# Patient Record
Sex: Male | Born: 1945 | Race: White | Hispanic: No | Marital: Married | Smoking: Former smoker
Health system: Southern US, Community
[De-identification: ages and names within clinical notes are randomized; demographics above are authoritative.]

## PROBLEM LIST (undated history)

## (undated) DIAGNOSIS — F32A Depression, unspecified: Secondary | ICD-10-CM

## (undated) DIAGNOSIS — C801 Malignant (primary) neoplasm, unspecified: Secondary | ICD-10-CM

## (undated) DIAGNOSIS — B0229 Other postherpetic nervous system involvement: Secondary | ICD-10-CM

## (undated) DIAGNOSIS — I350 Nonrheumatic aortic (valve) stenosis: Secondary | ICD-10-CM

## (undated) DIAGNOSIS — F329 Major depressive disorder, single episode, unspecified: Secondary | ICD-10-CM

## (undated) DIAGNOSIS — R351 Nocturia: Secondary | ICD-10-CM

## (undated) DIAGNOSIS — R011 Cardiac murmur, unspecified: Secondary | ICD-10-CM

## (undated) DIAGNOSIS — G8929 Other chronic pain: Secondary | ICD-10-CM

## (undated) DIAGNOSIS — Z9989 Dependence on other enabling machines and devices: Secondary | ICD-10-CM

## (undated) DIAGNOSIS — I10 Essential (primary) hypertension: Secondary | ICD-10-CM

## (undated) DIAGNOSIS — M1990 Unspecified osteoarthritis, unspecified site: Secondary | ICD-10-CM

## (undated) DIAGNOSIS — R600 Localized edema: Secondary | ICD-10-CM

## (undated) DIAGNOSIS — Z87898 Personal history of other specified conditions: Secondary | ICD-10-CM

## (undated) DIAGNOSIS — E785 Hyperlipidemia, unspecified: Secondary | ICD-10-CM

## (undated) DIAGNOSIS — G4700 Insomnia, unspecified: Secondary | ICD-10-CM

## (undated) DIAGNOSIS — M25552 Pain in left hip: Secondary | ICD-10-CM

## (undated) DIAGNOSIS — N400 Enlarged prostate without lower urinary tract symptoms: Secondary | ICD-10-CM

## (undated) DIAGNOSIS — K219 Gastro-esophageal reflux disease without esophagitis: Secondary | ICD-10-CM

## (undated) DIAGNOSIS — K5900 Constipation, unspecified: Secondary | ICD-10-CM

## (undated) DIAGNOSIS — M4800 Spinal stenosis, site unspecified: Secondary | ICD-10-CM

## (undated) DIAGNOSIS — F419 Anxiety disorder, unspecified: Secondary | ICD-10-CM

## (undated) DIAGNOSIS — Z8709 Personal history of other diseases of the respiratory system: Secondary | ICD-10-CM

## (undated) DIAGNOSIS — G4733 Obstructive sleep apnea (adult) (pediatric): Secondary | ICD-10-CM

## (undated) DIAGNOSIS — J309 Allergic rhinitis, unspecified: Secondary | ICD-10-CM

## (undated) DIAGNOSIS — E876 Hypokalemia: Secondary | ICD-10-CM

## (undated) DIAGNOSIS — Z85828 Personal history of other malignant neoplasm of skin: Secondary | ICD-10-CM

## (undated) DIAGNOSIS — H269 Unspecified cataract: Secondary | ICD-10-CM

## (undated) DIAGNOSIS — M542 Cervicalgia: Secondary | ICD-10-CM

## (undated) DIAGNOSIS — B029 Zoster without complications: Secondary | ICD-10-CM

## (undated) DIAGNOSIS — M549 Dorsalgia, unspecified: Secondary | ICD-10-CM

## (undated) DIAGNOSIS — R0600 Dyspnea, unspecified: Secondary | ICD-10-CM

## (undated) DIAGNOSIS — I714 Abdominal aortic aneurysm, without rupture: Secondary | ICD-10-CM

## (undated) HISTORY — DX: Other postherpetic nervous system involvement: B02.29

## (undated) HISTORY — PX: COLONOSCOPY: SHX174

## (undated) HISTORY — DX: Allergic rhinitis, unspecified: J30.9

## (undated) HISTORY — PX: CATARACT EXTRACTION, BILATERAL: SHX1313

## (undated) HISTORY — DX: Insomnia, unspecified: G47.00

## (undated) HISTORY — PX: TRANSURETHRAL RESECTION OF PROSTATE: SHX73

## (undated) HISTORY — PX: TONSILLECTOMY: SUR1361

## (undated) HISTORY — PX: OTHER SURGICAL HISTORY: SHX169

## (undated) HISTORY — DX: Essential (primary) hypertension: I10

---

## 2011-01-26 HISTORY — PX: ANKLE ARTHROSCOPY: SHX545

## 2011-04-19 ENCOUNTER — Ambulatory Visit (HOSPITAL_BASED_OUTPATIENT_CLINIC_OR_DEPARTMENT_OTHER)
Admission: RE | Admit: 2011-04-19 | Discharge: 2011-04-19 | Disposition: A | Discharge status: Home / Self Care | Payer: Medicare Other | Source: Ambulatory Visit | Attending: Orthopedic Surgery | Admitting: Orthopedic Surgery

## 2011-04-19 DIAGNOSIS — Z01810 Encounter for preprocedural cardiovascular examination: Secondary | ICD-10-CM | POA: Insufficient documentation

## 2011-04-19 DIAGNOSIS — X58XXXA Exposure to other specified factors, initial encounter: Secondary | ICD-10-CM | POA: Insufficient documentation

## 2011-04-19 DIAGNOSIS — Z01812 Encounter for preprocedural laboratory examination: Secondary | ICD-10-CM | POA: Insufficient documentation

## 2011-04-19 DIAGNOSIS — IMO0002 Reserved for concepts with insufficient information to code with codable children: Secondary | ICD-10-CM | POA: Insufficient documentation

## 2011-04-19 DIAGNOSIS — M25569 Pain in unspecified knee: Secondary | ICD-10-CM | POA: Insufficient documentation

## 2011-04-19 DIAGNOSIS — R9431 Abnormal electrocardiogram [ECG] [EKG]: Secondary | ICD-10-CM | POA: Insufficient documentation

## 2011-04-19 HISTORY — PX: KNEE ARTHROSCOPY: SUR90

## 2011-04-19 LAB — POCT I-STAT 4, (NA,K, GLUC, HGB,HCT)
Glucose, Bld: 117 mg/dL — ABNORMAL HIGH (ref 70–99)
HCT: 44 % (ref 39.0–52.0)
Hemoglobin: 15 g/dL (ref 13.0–17.0)

## 2011-04-29 NOTE — Op Note (Signed)
  NAME:  JWAN, HORNBAKER NO.:  [id number]  MEDICAL RECORD NO.:  [id number]          PATIENT TYPE:  LOCATION:                                 FACILITY:  PHYSICIAN:  Ollen Gross, M.D.         DATE OF BIRTH:  DATE OF PROCEDURE:  04/19/2011 DATE OF DISCHARGE:                              OPERATIVE REPORT   PREOPERATIVE DIAGNOSIS:  Left knee medial meniscal tear.  POSTOPERATIVE DIAGNOSIS:  Left knee medial meniscal tear.  PROCEDURE:  Left knee arthroscopy with medial meniscal debridement.  SURGEON:  Ollen Gross, MD  ASSISTANT:  None.  ANESTHESIA:  General.  ESTIMATED BLOOD LOSS:  Minimal.  DRAINS:  None.  COMPLICATIONS:  None.  CONDITION:  Stable to Recovery.  BRIEF CLINICAL NOTE:  Mr. Martin is a 65 year old male with several- month history of significant left knee pain, mechanical symptoms.  Exam and history suggested a medial meniscal tear confirmed by MRI.  He presents for arthroscopy and debridement.  PROCEDURE IN DETAIL:  After successful administration of general anesthetic, a tourniquet was placed high on his left thigh and his left lower extremity was prepped and draped in usual sterile fashion. Standard superomedial and inferolateral incisions made, inflow cannula passed superomedial, camera passed inferolateral.  Arthroscopic visualization proceeds.  Undersurface of the patella and trochlea looked normal.  The median and lateral gutters were visualized and there were no loose bodies.  Flexion and valgus force was applied to the knee and medial compartment was entered.  Does have evidence of a tear in the undersurface of the body and posterior horn of the medial meniscus.  The chondral surfaces looked normal.  Spinal needle was used to localize the inferomedial portal.  Small incision was made and dilator placed. Meniscus was debrided back to a stable base with baskets and a 4.2 mm shaver and then sealed off with the ArthroCare device.   It is found to be stable.  The chondral surfaces remained normal looking.  The intercondylar notch was visualized.  The ACL was normal.  Lateral compartment were entered and it looks normal.  Joints again inspected. No other tears, loose bodies or defects were noted.  The arthroscopic equipments were then removed from inferior portals, which were closed with interrupted 4-0 nylon.  20 mL of 0.25% Marcaine with epi injected through the inflow cannula and that is removed and then that portal closed with nylon.  The incision was cleaned and dried and a bulky sterile dressing applied.  He was subsequently awakened and transported to Recovery in stable condition.     Ollen Gross, M.D.     FA/MEDQ  D:  04/19/2011  T:  04/19/2011  Job:  [id number]  Electronically Signed by Ollen Gross M.D. on 04/29/2011 04:05:33 PM

## 2014-05-05 ENCOUNTER — Other Ambulatory Visit: Payer: Self-pay | Admitting: Family Medicine

## 2014-05-05 DIAGNOSIS — Z87891 Personal history of nicotine dependence: Secondary | ICD-10-CM

## 2014-05-11 ENCOUNTER — Ambulatory Visit
Admission: RE | Admit: 2014-05-11 | Discharge: 2014-05-11 | Disposition: A | Discharge status: Home / Self Care | Payer: Medicare Other | Source: Ambulatory Visit | Attending: Family Medicine | Admitting: Family Medicine

## 2014-05-11 DIAGNOSIS — Z87891 Personal history of nicotine dependence: Secondary | ICD-10-CM

## 2014-08-05 ENCOUNTER — Encounter: Payer: Self-pay | Admitting: *Deleted

## 2014-11-16 ENCOUNTER — Ambulatory Visit
Admission: RE | Admit: 2014-11-16 | Discharge: 2014-11-16 | Disposition: A | Discharge status: Home / Self Care | Payer: Medicare Other | Source: Ambulatory Visit | Attending: Family Medicine | Admitting: Family Medicine

## 2014-11-16 ENCOUNTER — Other Ambulatory Visit: Payer: Self-pay | Admitting: Family Medicine

## 2014-11-16 DIAGNOSIS — R05 Cough: Secondary | ICD-10-CM

## 2014-11-16 DIAGNOSIS — R059 Cough, unspecified: Secondary | ICD-10-CM

## 2015-04-27 ENCOUNTER — Ambulatory Visit: Payer: Self-pay | Admitting: Orthopedic Surgery

## 2015-04-27 NOTE — Progress Notes (Signed)
Preoperative surgical orders have been place into the Epic hospital system for [employer] on 04/27/2015, 5:40 PM  by Mickel Crow for surgery on 05-18-2015.  Preop Knee Scope orders including IV Tylenol and IV Decadron as long as there are no contraindications to the above medications. Arlee Muslim, PA-C

## 2015-05-06 ENCOUNTER — Encounter (HOSPITAL_BASED_OUTPATIENT_CLINIC_OR_DEPARTMENT_OTHER): Payer: Self-pay | Admitting: *Deleted

## 2015-05-11 ENCOUNTER — Encounter (HOSPITAL_BASED_OUTPATIENT_CLINIC_OR_DEPARTMENT_OTHER): Payer: Self-pay | Admitting: *Deleted

## 2015-05-11 NOTE — Progress Notes (Signed)
Pt instructed npo pmn 6/20.  May have clear liquids until 1000, then absolutely nothing by mouth.  Pt to take xanaflex, pravastatin, norvasc, Kt, prilosec .  To [facility] 6/21 at 1230. Needs istat, ekg on arrival.

## 2015-05-18 ENCOUNTER — Ambulatory Visit (HOSPITAL_BASED_OUTPATIENT_CLINIC_OR_DEPARTMENT_OTHER)
Admission: RE | Admit: 2015-05-18 | Discharge: 2015-05-18 | Disposition: A | Discharge status: Home / Self Care | Payer: Medicare Other | Source: Ambulatory Visit | Attending: Orthopedic Surgery | Admitting: Orthopedic Surgery

## 2015-05-18 ENCOUNTER — Encounter (HOSPITAL_BASED_OUTPATIENT_CLINIC_OR_DEPARTMENT_OTHER): Payer: Self-pay | Admitting: *Deleted

## 2015-05-18 ENCOUNTER — Encounter (HOSPITAL_BASED_OUTPATIENT_CLINIC_OR_DEPARTMENT_OTHER)
Admission: RE | Disposition: A | Discharge status: Home / Self Care | Payer: Self-pay | Source: Ambulatory Visit | Attending: Orthopedic Surgery

## 2015-05-18 ENCOUNTER — Other Ambulatory Visit: Payer: Self-pay

## 2015-05-18 ENCOUNTER — Ambulatory Visit (HOSPITAL_BASED_OUTPATIENT_CLINIC_OR_DEPARTMENT_OTHER): Payer: Medicare Other | Admitting: Anesthesiology

## 2015-05-18 DIAGNOSIS — S83249A Other tear of medial meniscus, current injury, unspecified knee, initial encounter: Secondary | ICD-10-CM | POA: Diagnosis present

## 2015-05-18 DIAGNOSIS — I10 Essential (primary) hypertension: Secondary | ICD-10-CM | POA: Diagnosis not present

## 2015-05-18 DIAGNOSIS — Z87891 Personal history of nicotine dependence: Secondary | ICD-10-CM | POA: Insufficient documentation

## 2015-05-18 DIAGNOSIS — G4733 Obstructive sleep apnea (adult) (pediatric): Secondary | ICD-10-CM | POA: Diagnosis not present

## 2015-05-18 DIAGNOSIS — Z791 Long term (current) use of non-steroidal anti-inflammatories (NSAID): Secondary | ICD-10-CM | POA: Diagnosis not present

## 2015-05-18 DIAGNOSIS — Z9989 Dependence on other enabling machines and devices: Secondary | ICD-10-CM | POA: Diagnosis not present

## 2015-05-18 DIAGNOSIS — S83241A Other tear of medial meniscus, current injury, right knee, initial encounter: Secondary | ICD-10-CM | POA: Diagnosis not present

## 2015-05-18 DIAGNOSIS — J309 Allergic rhinitis, unspecified: Secondary | ICD-10-CM | POA: Insufficient documentation

## 2015-05-18 DIAGNOSIS — M25561 Pain in right knee: Secondary | ICD-10-CM | POA: Diagnosis present

## 2015-05-18 DIAGNOSIS — K219 Gastro-esophageal reflux disease without esophagitis: Secondary | ICD-10-CM | POA: Insufficient documentation

## 2015-05-18 DIAGNOSIS — G4700 Insomnia, unspecified: Secondary | ICD-10-CM | POA: Insufficient documentation

## 2015-05-18 DIAGNOSIS — Y929 Unspecified place or not applicable: Secondary | ICD-10-CM | POA: Diagnosis not present

## 2015-05-18 DIAGNOSIS — M545 Low back pain: Secondary | ICD-10-CM | POA: Diagnosis not present

## 2015-05-18 DIAGNOSIS — G8929 Other chronic pain: Secondary | ICD-10-CM | POA: Diagnosis not present

## 2015-05-18 DIAGNOSIS — N400 Enlarged prostate without lower urinary tract symptoms: Secondary | ICD-10-CM | POA: Insufficient documentation

## 2015-05-18 DIAGNOSIS — Y999 Unspecified external cause status: Secondary | ICD-10-CM | POA: Insufficient documentation

## 2015-05-18 DIAGNOSIS — X58XXXA Exposure to other specified factors, initial encounter: Secondary | ICD-10-CM | POA: Diagnosis not present

## 2015-05-18 DIAGNOSIS — Y939 Activity, unspecified: Secondary | ICD-10-CM | POA: Insufficient documentation

## 2015-05-18 DIAGNOSIS — Z7982 Long term (current) use of aspirin: Secondary | ICD-10-CM | POA: Diagnosis not present

## 2015-05-18 DIAGNOSIS — M13872 Other specified arthritis, left ankle and foot: Secondary | ICD-10-CM | POA: Insufficient documentation

## 2015-05-18 DIAGNOSIS — Z79899 Other long term (current) drug therapy: Secondary | ICD-10-CM | POA: Diagnosis not present

## 2015-05-18 HISTORY — PX: KNEE ARTHROSCOPY WITH MEDIAL MENISECTOMY: SHX5651

## 2015-05-18 HISTORY — DX: Personal history of other diseases of the respiratory system: Z87.09

## 2015-05-18 HISTORY — DX: Unspecified osteoarthritis, unspecified site: M19.90

## 2015-05-18 HISTORY — DX: Dependence on other enabling machines and devices: Z99.89

## 2015-05-18 HISTORY — DX: Gastro-esophageal reflux disease without esophagitis: K21.9

## 2015-05-18 HISTORY — DX: Benign prostatic hyperplasia without lower urinary tract symptoms: N40.0

## 2015-05-18 HISTORY — DX: Obstructive sleep apnea (adult) (pediatric): G47.33

## 2015-05-18 LAB — POCT I-STAT, CHEM 8
BUN: 13 mg/dL (ref 6–20)
CHLORIDE: 98 mmol/L — AB (ref 101–111)
Calcium, Ion: 1.24 mmol/L (ref 1.13–1.30)
Creatinine, Ser: 0.7 mg/dL (ref 0.61–1.24)
Glucose, Bld: 103 mg/dL — ABNORMAL HIGH (ref 65–99)
HCT: 43 % (ref 39.0–52.0)
Hemoglobin: 14.6 g/dL (ref 13.0–17.0)
Potassium: 3.3 mmol/L — ABNORMAL LOW (ref 3.5–5.1)
Sodium: 138 mmol/L (ref 135–145)
TCO2: 26 mmol/L (ref 0–100)

## 2015-05-18 SURGERY — ARTHROSCOPY, KNEE, WITH MEDIAL MENISCECTOMY
Anesthesia: General | Site: Knee | Laterality: Right

## 2015-05-18 MED ORDER — MEPERIDINE HCL 25 MG/ML IJ SOLN
6.2500 mg | INTRAMUSCULAR | Status: DC | PRN
  Filled 2015-05-18: qty 1

## 2015-05-18 MED ORDER — STERILE WATER FOR IRRIGATION IR SOLN
Status: DC | PRN
  Administered 2015-05-18: 500 mL

## 2015-05-18 MED ORDER — LIDOCAINE HCL (CARDIAC) 20 MG/ML IV SOLN
INTRAVENOUS | Status: DC | PRN
  Administered 2015-05-18: 50 mg via INTRAVENOUS

## 2015-05-18 MED ORDER — MIDAZOLAM HCL 2 MG/2ML IJ SOLN
INTRAMUSCULAR | Status: AC
  Filled 2015-05-18: qty 2

## 2015-05-18 MED ORDER — ONDANSETRON HCL 4 MG/2ML IJ SOLN
INTRAMUSCULAR | Status: DC | PRN
  Administered 2015-05-18: 4 mg via INTRAVENOUS

## 2015-05-18 MED ORDER — OXYCODONE HCL 5 MG PO TABS
ORAL_TABLET | ORAL | Status: AC
  Filled 2015-05-18: qty 1

## 2015-05-18 MED ORDER — CEFAZOLIN SODIUM-DEXTROSE 2-3 GM-% IV SOLR
2.0000 g | INTRAVENOUS | Status: AC
  Administered 2015-05-18: 2 g via INTRAVENOUS
  Filled 2015-05-18: qty 50

## 2015-05-18 MED ORDER — KETOROLAC TROMETHAMINE 30 MG/ML IJ SOLN
INTRAMUSCULAR | Status: DC | PRN
  Administered 2015-05-18: 30 mg via INTRAVENOUS

## 2015-05-18 MED ORDER — LACTATED RINGERS IV SOLN
INTRAVENOUS | Status: DC
  Administered 2015-05-18: 13:00:00 via INTRAVENOUS
  Filled 2015-05-18: qty 1000

## 2015-05-18 MED ORDER — DEXAMETHASONE SODIUM PHOSPHATE 4 MG/ML IJ SOLN
INTRAMUSCULAR | Status: DC | PRN
  Administered 2015-05-18: 10 mg via INTRAVENOUS

## 2015-05-18 MED ORDER — FENTANYL CITRATE (PF) 100 MCG/2ML IJ SOLN
INTRAMUSCULAR | Status: AC
  Filled 2015-05-18: qty 4

## 2015-05-18 MED ORDER — HYDROCODONE-ACETAMINOPHEN 5-325 MG PO TABS: 1.0000 | ORAL_TABLET | Freq: Four times a day (QID) | ORAL | Status: DC | PRN

## 2015-05-18 MED ORDER — PROPOFOL 10 MG/ML IV BOLUS
INTRAVENOUS | Status: DC | PRN
  Administered 2015-05-18: 150 mg via INTRAVENOUS

## 2015-05-18 MED ORDER — CEFAZOLIN SODIUM-DEXTROSE 2-3 GM-% IV SOLR
INTRAVENOUS | Status: AC
  Filled 2015-05-18: qty 50

## 2015-05-18 MED ORDER — CHLORHEXIDINE GLUCONATE 4 % EX LIQD
60.0000 mL | Freq: Once | CUTANEOUS | Status: DC
  Filled 2015-05-18: qty 60

## 2015-05-18 MED ORDER — FENTANYL CITRATE (PF) 100 MCG/2ML IJ SOLN
25.0000 ug | INTRAMUSCULAR | Status: DC | PRN
  Filled 2015-05-18: qty 1

## 2015-05-18 MED ORDER — BUPIVACAINE-EPINEPHRINE 0.25% -1:200000 IJ SOLN
INTRAMUSCULAR | Status: DC | PRN
  Administered 2015-05-18: 20 mL

## 2015-05-18 MED ORDER — ACETAMINOPHEN 10 MG/ML IV SOLN
1000.0000 mg | Freq: Once | INTRAVENOUS | Status: AC
  Administered 2015-05-18: 1000 mg via INTRAVENOUS
  Filled 2015-05-18: qty 100

## 2015-05-18 MED ORDER — MIDAZOLAM HCL 5 MG/5ML IJ SOLN
INTRAMUSCULAR | Status: DC | PRN
  Administered 2015-05-18: 0.5 mg via INTRAVENOUS

## 2015-05-18 MED ORDER — FENTANYL CITRATE (PF) 100 MCG/2ML IJ SOLN
INTRAMUSCULAR | Status: DC | PRN
  Administered 2015-05-18 (×4): 25 ug via INTRAVENOUS
  Administered 2015-05-18: 50 ug via INTRAVENOUS

## 2015-05-18 MED ORDER — DEXAMETHASONE SODIUM PHOSPHATE 10 MG/ML IJ SOLN
10.0000 mg | Freq: Once | INTRAMUSCULAR | Status: DC
  Filled 2015-05-18: qty 1

## 2015-05-18 MED ORDER — SODIUM CHLORIDE 0.9 % IR SOLN
Status: DC | PRN
  Administered 2015-05-18: 6000 mL

## 2015-05-18 MED ORDER — OXYCODONE HCL 5 MG PO TABS
5.0000 mg | ORAL_TABLET | Freq: Once | ORAL | Status: AC
Start: 2015-05-18 — End: 2015-05-18
  Administered 2015-05-18: 5 mg via ORAL
  Filled 2015-05-18: qty 1

## 2015-05-18 MED ORDER — SODIUM CHLORIDE 0.9 % IV SOLN
INTRAVENOUS | Status: DC
  Filled 2015-05-18: qty 1000

## 2015-05-18 SURGICAL SUPPLY — 32 items
BANDAGE ELASTIC 6 VELCRO ST LF (GAUZE/BANDAGES/DRESSINGS) ×3 IMPLANT
BLADE 4.2CUDA (BLADE) ×3 IMPLANT
CLOTH BEACON ORANGE TIMEOUT ST (SAFETY) ×3 IMPLANT
CUFF TOURN SGL QUICK 34 (TOURNIQUET CUFF) ×2
CUFF TRNQT CYL 34X4X40X1 (TOURNIQUET CUFF) ×1 IMPLANT
DRAPE ARTHROSCOPY W/POUCH 114 (DRAPES) ×3 IMPLANT
DRAPE U-SHAPE 47X51 STRL (DRAPES) ×3 IMPLANT
DRSG EMULSION OIL 3X3 NADH (GAUZE/BANDAGES/DRESSINGS) ×3 IMPLANT
DRSG PAD ABDOMINAL 8X10 ST (GAUZE/BANDAGES/DRESSINGS) ×3 IMPLANT
DURAPREP 26ML APPLICATOR (WOUND CARE) ×3 IMPLANT
GLOVE BIO SURGEON STRL SZ8 (GLOVE) ×3 IMPLANT
GLOVE BIOGEL PI IND STRL 7.5 (GLOVE) ×1 IMPLANT
GLOVE BIOGEL PI INDICATOR 7.5 (GLOVE) ×2
GLOVE INDICATOR 8.0 STRL GRN (GLOVE) ×3 IMPLANT
GLOVE SURG SS PI 7.5 STRL IVOR (GLOVE) ×3 IMPLANT
GOWN STRL REUS W/ TWL LRG LVL3 (GOWN DISPOSABLE) ×2 IMPLANT
GOWN STRL REUS W/TWL LRG LVL3 (GOWN DISPOSABLE) ×4
IV NS IRRIG 3000ML ARTHROMATIC (IV SOLUTION) ×3 IMPLANT
KNEE WRAP E Z 3 GEL PACK (MISCELLANEOUS) ×3 IMPLANT
MANIFOLD NEPTUNE II (INSTRUMENTS) ×3 IMPLANT
PACK ARTHROSCOPY DSU (CUSTOM PROCEDURE TRAY) ×3 IMPLANT
PACK BASIN DAY SURGERY FS (CUSTOM PROCEDURE TRAY) ×3 IMPLANT
PADDING CAST ABS 4INX4YD NS (CAST SUPPLIES) ×2
PADDING CAST ABS COTTON 4X4 ST (CAST SUPPLIES) ×1 IMPLANT
PADDING CAST COTTON 6X4 STRL (CAST SUPPLIES) ×3 IMPLANT
SET ARTHROSCOPY TUBING (MISCELLANEOUS) ×2
SET ARTHROSCOPY TUBING LN (MISCELLANEOUS) ×1 IMPLANT
SPONGE GAUZE 4X4 12PLY (GAUZE/BANDAGES/DRESSINGS) ×3 IMPLANT
SPONGE GAUZE 4X4 12PLY STER LF (GAUZE/BANDAGES/DRESSINGS) ×3 IMPLANT
SUT ETHILON 4 0 PS 2 18 (SUTURE) ×3 IMPLANT
TOWEL OR 17X24 6PK STRL BLUE (TOWEL DISPOSABLE) ×3 IMPLANT
WATER STERILE IRR 500ML POUR (IV SOLUTION) ×3 IMPLANT

## 2015-05-18 NOTE — Op Note (Signed)
Preoperative diagnosis-  Right knee medial meniscal tear  Postoperative diagnosis Right- knee medial meniscal tear   Procedure- Right knee arthroscopy with medial  meniscal debridement    Surgeon- Dione Plover. Sawyer Mentzer, MD  Anesthesia-General  EBL-  Minimal  Complications- None  Condition- PACU - hemodynamically stable.  Brief clinical note- -Mark Zamora is a 69 y.o.  male with a several month history of right knee pain and mechanical symptoms. Exam and history suggested medial meniscal tear confirmed by MRI. The patient presents now for arthroscopy and debridement   Procedure in detail -       After successful administration of General anesthetic, a tourmiquet is placed high on the Right  thigh and the Right lower extremity is prepped and draped in the usual sterile fashion. Time out is performed by the surgical team. Standard superomedial and inferolateral portal sites are marked and incisions made with an 11 blade. The inflow cannula is passed through the superomedial portal and camera through the inferolateral portal and inflow is initiated. Arthroscopic visualization proceeds.      The undersurface of the patella and trochlea are visualized and they are normal. The medial and lateral gutters are visualized and there are  no loose bodies. Flexion and valgus force is applied to the knee and the medial compartment is entered. A spinal needle is passed into the joint through the site marked for the inferomedial portal. A small incision is made and the dilator passed into the joint. The findings for the medial compartment are unstable tear of body and posterior horn of medial meniscus . The tear is debrided to a stable base with baskets and a shaver and sealed off with the Arthrocare. It is probed and found to be stable.There are no chondral defects.    The intercondylar notch is visualized and the ACL appears normal.The lateral compartment is inspected and is normal.     The joint is  again inspected and there are no other tears, defects or loose bodies identified. The arthroscopic equipment is then removed from the inferior portals which are closed with interrupted 4-0 nylon. 20 ml of .25% Marcaine with epinephrine are injected through the inflow cannula and the cannula is then removed and the portal closed with nylon. The incisions are cleaned and dried and a bulky sterile dressing is applied. The patient is then awakened and transported to recovery in stable condition.   05/18/2015, 3:13 PM

## 2015-05-18 NOTE — H&P (Signed)
CC- Mark Zamora is a 69 y.o. male who presents with right knee pain.  HPI- . Knee Pain: Patient presents with knee pain involving the  right knee. Onset of the symptoms was several months ago. Inciting event: none known. Current symptoms include giving out, locking, pain located medially and stiffness. Pain is aggravated by lateral movements, rising after sitting, standing and walking.  Patient has had no prior knee problems. Evaluation to date: MRI: abnormal medial meniscal tear. Treatment to date: rest.  Past Medical History  Diagnosis Date  . Chronic low back pain   . Allergic rhinitis   . Postherpetic neuralgia   . Insomnia   . History of chronic sinusitis   . Essential hypertension   . Right knee meniscal tear   . BPH (benign prostatic hypertrophy)   . History of concussion     as child-- no residual  . GERD (gastroesophageal reflux disease)   . OSA on CPAP     uses cpap  . Arthritis     l ankle    Past Surgical History  Procedure Laterality Date  . Knee arthroscopy Left 04-19-2011  . Ankle arthroscopy Left 03/ 2012  . Prostate surgery  2007 approx  . Pilonidal fistulectomy      Prior to Admission medications   Medication Sig Start Date End Date Taking? Authorizing Provider  acetaminophen (TYLENOL) 325 MG tablet Take 650 mg by mouth every 6 (six) hours as needed.   Yes Historical Provider, MD  amLODipine (NORVASC) 10 MG tablet Take 10 mg by mouth daily.   Yes Historical Provider, MD  aspirin 325 MG tablet Take 325 mg by mouth daily.   Yes Historical Provider, MD  cholecalciferol (VITAMIN D) 1000 UNITS tablet Take 2,000 Units by mouth daily.   Yes Historical Provider, MD  Diphenhydramine-PE-APAP (DELSYM COUGH/COLD NIGHT TIME PO) Take 2.5 mLs by mouth at bedtime as needed.   Yes Historical Provider, MD  doxazosin (CARDURA) 8 MG tablet Take 8 mg by mouth at bedtime.   Yes Historical Provider, MD  fluticasone (FLONASE) 50 MCG/ACT nasal spray Place 2 sprays into both  nostrils at bedtime as needed for allergies or rhinitis.   Yes Historical Provider, MD  furosemide (LASIX) 20 MG tablet Take 20 mg by mouth.   Yes Historical Provider, MD  hydrochlorothiazide (HYDRODIURIL) 25 MG tablet Take 25 mg by mouth daily.   Yes Historical Provider, MD  lisinopril (PRINIVIL,ZESTRIL) 40 MG tablet Take 40 mg by mouth daily.   Yes Historical Provider, MD  meloxicam (MOBIC) 15 MG tablet Take 15 mg by mouth daily with breakfast.   Yes Historical Provider, MD  Multiple Vitamins-Minerals (MULTIVITAMIN WITH MINERALS) tablet Take 1 tablet by mouth daily.   Yes Historical Provider, MD  omeprazole (PRILOSEC) 20 MG capsule Take 20 mg by mouth daily.   Yes Historical Provider, MD  potassium chloride SA (K-DUR,KLOR-CON) 20 MEQ tablet Take 20 mEq by mouth 2 (two) times daily. Take 2 tablets twice daily   Yes Historical Provider, MD  pravastatin (PRAVACHOL) 80 MG tablet Take 80 mg by mouth daily.   Yes Historical Provider, MD  tiZANidine (ZANAFLEX) 4 MG capsule Take 4 mg by mouth every 8 (eight) hours as needed for muscle spasms.   Yes Historical Provider, MD  vitamin E 100 UNIT capsule Take by mouth daily.   Yes Historical Provider, MD  zolpidem (AMBIEN) 10 MG tablet Take 10 mg by mouth at bedtime as needed for sleep.   Yes Historical Provider, MD  KNEE EXAM antalgic gait, soft tissue tenderness over medial joint line, no effusion, negative drawer sign, collateral ligaments intact  Physical Examination: General appearance - alert, well appearing, and in no distress Mental status - alert, oriented to person, place, and time Chest - clear to auscultation, no wheezes, rales or rhonchi, symmetric air entry Heart - normal rate, regular rhythm, normal S1, S2, no murmurs, rubs, clicks or gallops Abdomen - soft, nontender, nondistended, no masses or organomegaly Neurological - alert, oriented, normal speech, no focal findings or movement disorder noted    Asessment/Plan--- Right knee medial  meniscal tear- - Plan right knee arthroscopy with meniscal debridement. Procedure risks and potential comps discussed with patient who elects to proceed. Goals are decreased pain and increased function with a high likelihood of achieving both

## 2015-05-18 NOTE — Anesthesia Procedure Notes (Signed)
Procedure Name: LMA Insertion Date/Time: 05/18/2015 2:45 PM Performed by: Mechele Claude Pre-anesthesia Checklist: Patient identified, Emergency Drugs available, Suction available and Patient being monitored Patient Re-evaluated:Patient Re-evaluated prior to inductionOxygen Delivery Method: Circle System Utilized Preoxygenation: Pre-oxygenation with 100% oxygen Intubation Type: IV induction Ventilation: Mask ventilation without difficulty LMA: LMA inserted LMA Size: 4.0 Number of attempts: 1 Airway Equipment and Method: bite block Placement Confirmation: positive ETCO2 Tube secured with: Tape Dental Injury: Teeth and Oropharynx as per pre-operative assessment

## 2015-05-18 NOTE — Anesthesia Postprocedure Evaluation (Signed)
  Anesthesia Post-op Note  Patient: Mark Zamora  Procedure(s) Performed: Procedure(s) (LRB): RIGHT KNEE ARTHROSCOPY WITH  MEDIAL MENISCAL DEBRIDEMENT (Right)  Patient Location: PACU  Anesthesia Type: General  Level of Consciousness: awake and alert   Airway and Oxygen Therapy: Patient Spontanous Breathing  Post-op Pain: mild  Post-op Assessment: Post-op Vital signs reviewed, Patient's Cardiovascular Status Stable, Respiratory Function Stable, Patent Airway and No signs of Nausea or vomiting  Last Vitals:  Filed Vitals:   05/18/15 1552  BP: 168/82  Pulse: 63  Temp:   Resp: 18    Post-op Vital Signs: stable   Complications: No apparent anesthesia complications

## 2015-05-18 NOTE — Discharge Instructions (Signed)
° °Dr. Frank Aluisio °[facility] Specialist °[facility] °[address] °, [place] [address] °[phone] ° ° °Arthroscopic Procedure, Knee °An arthroscopic procedure can find what is wrong with your knee. °PROCEDURE °Arthroscopy is a surgical technique that allows your orthopedic surgeon to diagnose and treat your knee injury with accuracy. They will look into your knee through a small instrument. This is almost like a small (pencil sized) telescope. Because arthroscopy affects your knee less than open knee surgery, you can anticipate a more rapid recovery. Taking an active role by following your caregiver's instructions will help with rapid and complete recovery. Use crutches, rest, elevation, ice, and knee exercises as instructed. The length of recovery depends on various factors including type of injury, age, physical condition, medical conditions, and your rehabilitation. °Your knee is the joint between the large bones (femur and tibia) in your leg. Cartilage covers these bone ends which are smooth and slippery and allow your knee to bend and move smoothly. Two menisci, thick, semi-lunar shaped pads of cartilage which form a rim inside the joint, help absorb shock and stabilize your knee. Ligaments bind the bones together and support your knee joint. Muscles move the joint, help support your knee, and take stress off the joint itself. Because of this all programs and physical therapy to rehabilitate an injured or repaired knee require rebuilding and strengthening your muscles. °AFTER THE PROCEDURE °· After the procedure, you will be moved to a recovery area until most of the effects of the medication have worn off. Your caregiver will discuss the test results with you.  °· Only take over-the-counter or prescription medicines for pain, discomfort, or fever as directed by your caregiver.  °SEEK MEDICAL CARE IF:  °· You have increased bleeding from your wounds.  °· You see  redness, swelling, or have increasing pain in your wounds.  °· You have pus coming from your wound.  °· You have an oral temperature above 102° F (38.9° C).  °· You notice a bad smell coming from the wound or dressing.  °· You have severe pain with any motion of your knee.  °SEEK IMMEDIATE MEDICAL CARE IF:  °· You develop a rash.  °· You have difficulty breathing.  °· You have any allergic problems.  °FURTHER INSTRUCTIONS:  °· ICE to the affected knee every three hours for 30 minutes at a time and then as needed for pain and swelling.  Continue to use ice on the knee for pain and swelling from surgery. You may notice swelling that will progress down to the foot and ankle.  This is normal after surgery.  Elevate the leg when you are not up walking on it.   ° °DIET °You may resume your previous home diet once your are discharged from the hospital. ° °DRESSING / WOUND CARE / SHOWERING °You may start showering two days after being discharged home but do not submerge the incisions under water.  °Change dressing 48 hours after the procedure and then cover the small incisions with band aids until your follow up visit. °Change the surgical dressings daily and reapply a dry dressing each time.  ° °ACTIVITY °Walk with your walker as instructed. °Use walker as long as suggested by your caregivers. °Avoid periods of inactivity such as sitting longer than an hour when not asleep. This helps prevent blood clots.  °You may resume a sexual relationship in one month or when given the OK by your doctor.  °You may return to   work once you are cleared by your doctor.  Do not drive a car for 6 weeks or until released by you surgeon.  Do not drive while taking narcotics.  WEIGHT BEARING Weight Bearing as Tolerated with crutches or walker until you are comfortable and well balanced then walk without assistance  POSTOPERATIVE CONSTIPATION PROTOCOL Constipation - defined medically as fewer than three stools per week and severe  constipation as less than one stool per week.  One of the most common issues patients have following surgery is constipation.  Even if you have a regular bowel pattern at home, your normal regimen is likely to be disrupted due to multiple reasons following surgery.  Combination of anesthesia, postoperative narcotics, change in appetite and fluid intake all can affect your bowels.  In order to avoid complications following surgery, here are some recommendations in order to help you during your recovery period.  Colace (docusate) - Pick up an over-the-counter form of Colace or another stool softener and take twice a day as long as you are requiring postoperative pain medications.  Take with a full glass of water daily.  If you experience loose stools or diarrhea, hold the colace until you stool forms back up.  If your symptoms do not get better within 1 week or if they get worse, check with your doctor.  Dulcolax (bisacodyl) - Pick up over-the-counter and take as directed by the product packaging as needed to assist with the movement of your bowels.  Take with a full glass of water.  Use this product as needed if not relieved by Colace only.   MiraLax (polyethylene glycol) - Pick up over-the-counter to have on hand.  MiraLax is a solution that will increase the amount of water in your bowels to assist with bowel movements.  Take as directed and can mix with a glass of water, juice, soda, coffee, or tea.  Take if you go more than two days without a movement. Do not use MiraLax more than once per day. Call your doctor if you are still constipated or irregular after using this medication for 7 days in a row.  If you continue to have problems with postoperative constipation, please contact the office for further assistance and recommendations.  If you experience "the worst abdominal pain ever" or develop nausea or vomiting, please contact the office immediatly for further recommendations for  treatment.  ITCHING  If you experience itching with your medications, try taking only a single pain pill, or even half a pain pill at a time.  You can also use Benadryl over the counter for itching or also to help with sleep.   TED HOSE STOCKINGS Wear the elastic stockings on both legs for three weeks following surgery during the day but you may remove then at night for sleeping.  MEDICATIONS See your medication summary on the After Visit Summary that the nursing staff will review with you prior to discharge.  You may have some home medications which will be placed on hold until you complete the course of blood thinner medication.  It is important for you to complete the blood thinner medication as prescribed by your surgeon.  Continue your approved medications as instructed at time of discharge. Do not drive while taking narcotics.   PRECAUTIONS If you experience chest pain or shortness of breath - call 911 immediately for transfer to the hospital emergency department.  If you develop a fever greater that 101 F, purulent drainage from wound, increased redness or drainage  from wound, foul odor from the wound/dressing, or calf pain - CONTACT YOUR SURGEON.                                                   FOLLOW-UP APPOINTMENTS Make sure you keep all of your appointments after your operation with your surgeon and caregivers. You should call the office at [phone]  and make an appointment for approximately one week after the date of your surgery or on the date instructed by your surgeon outlined in the "After Visit Summary".  RANGE OF MOTION AND STRENGTHENING EXERCISES  Rehabilitation of the knee is important following a knee injury or an operation. After just a few days of immobilization, the muscles of the thigh which control the knee become weakened and shrink (atrophy). Knee exercises are designed to build up the tone and strength of the thigh muscles and to improve knee motion. Often  times heat used for twenty to thirty minutes before working out will loosen up your tissues and help with improving the range of motion but do not use heat for the first two weeks following surgery. These exercises can be done on a training (exercise) mat, on the floor, on a table or on a bed. Use what ever works the best and is most comfortable for you Knee exercises include:  QUAD STRENGTHENING EXERCISES Strengthening Quadriceps Sets  Tighten muscles on top of thigh by pushing knees down into floor or table. Hold for 20 seconds. Repeat 10 times. Do 2 sessions per day.     Strengthening Terminal Knee Extension  With knee bent over bolster, straighten knee by tightening muscle on top of thigh. Be sure to keep bottom of knee on bolster. Hold for 20 seconds. Repeat 10 times. Do 2 sessions per day.   Straight Leg with Bent Knee  Lie on back with opposite leg bent. Keep involved knee slightly bent at knee and raise leg 4-6". Hold for 10 seconds. Repeat 20 times per set. Do 2 sets per session. Do 2 sessions per day.       Post Anesthesia Home Care Instructions  Activity: Get plenty of rest for the remainder of the day. A responsible adult should stay with you for 24 hours following the procedure.  For the next 24 hours, DO NOT: -Drive a car -Paediatric nurse -Drink alcoholic beverages -Take any medication unless instructed by your physician -Make any legal decisions or sign important papers.  Meals: Start with liquid foods such as gelatin or soup. Progress to regular foods as tolerated. Avoid greasy, spicy, heavy foods. If nausea and/or vomiting occur, drink only clear liquids until the nausea and/or vomiting subsides. Call your physician if vomiting continues.  Special Instructions/Symptoms: Your throat may feel dry or sore from the anesthesia or the breathing tube placed in your throat during surgery. If this causes discomfort, gargle with warm salt water. The discomfort  should disappear within 24 hours.  If you had a scopolamine patch placed behind your ear for the management of post- operative nausea and/or vomiting:  1. The medication in the patch is effective for 72 hours, after which it should be removed.  Wrap patch in a tissue and discard in the trash. Wash hands thoroughly with soap and water. 2. You may remove the patch earlier than 72 hours if you experience unpleasant side effects  which may include dry mouth, dizziness or visual disturbances. 3. Avoid touching the patch. Wash your hands with soap and water after contact with the patch.

## 2015-05-18 NOTE — Transfer of Care (Signed)
Immediate Anesthesia Transfer of Care Note  Patient: Mark Zamora  Procedure(s) Performed: Procedure(s): RIGHT KNEE ARTHROSCOPY WITH  MEDIAL MENISCAL DEBRIDEMENT (Right)  Patient Location: PACU  Anesthesia Type:General  Level of Consciousness: awake, alert , oriented and patient cooperative  Airway & Oxygen Therapy: Patient Spontanous Breathing and Patient connected to nasal cannula oxygen  Post-op Assessment: Report given to RN and Post -op Vital signs reviewed and stable  Post vital signs: Reviewed and stable  Last Vitals:  Filed Vitals:   05/18/15 1222  BP: 130/93  Pulse: 55  Temp: 36.7 C  Resp: 18    Complications: No apparent anesthesia complications

## 2015-05-18 NOTE — Interval H&P Note (Signed)
History and Physical Interval Note:  05/18/2015 2:38 PM  Mark Zamora  has presented today for surgery, with the diagnosis of RIGHT KNEE MEDIAL MENISCUS TEAR  The various methods of treatment have been discussed with the patient and family. After consideration of risks, benefits and other options for treatment, the patient has consented to  Procedure(s): RIGHT KNEE ARTHROSCOPY WITH  MENISCAL DEBRIDEMENT (Right) as a surgical intervention .  The patient's history has been reviewed, patient examined, no change in status, stable for surgery.  I have reviewed the patient's chart and labs.  Questions were answered to the patient's satisfaction.     Gearlean Alf

## 2015-05-18 NOTE — Anesthesia Preprocedure Evaluation (Addendum)
Anesthesia Evaluation  Patient identified by MRN, date of birth, ID band Patient awake    Reviewed: Allergy & Precautions, NPO status , Patient's Chart, lab work & pertinent test results  Airway Mallampati: II  TM Distance: >3 FB Neck ROM: Full    Dental no notable dental hx.    Pulmonary sleep apnea and Continuous Positive Airway Pressure Ventilation , former smoker,  breath sounds clear to auscultation  Pulmonary exam normal       Cardiovascular hypertension, Pt. on medications Normal cardiovascular examRhythm:Regular Rate:Normal     Neuro/Psych negative neurological ROS  negative psych ROS   GI/Hepatic negative GI ROS, Neg liver ROS,   Endo/Other  negative endocrine ROS  Renal/GU negative Renal ROS  negative genitourinary   Musculoskeletal negative musculoskeletal ROS (+)   Abdominal   Peds negative pediatric ROS (+)  Hematology negative hematology ROS (+)   Anesthesia Other Findings   Reproductive/Obstetrics negative OB ROS                            Anesthesia Physical Anesthesia Plan  ASA: II  Anesthesia Plan: General   Post-op Pain Management:    Induction: Intravenous  Airway Management Planned: LMA  Additional Equipment:   Intra-op Plan:   Post-operative Plan: Extubation in OR  Informed Consent: I have reviewed the patients History and Physical, chart, labs and discussed the procedure including the risks, benefits and alternatives for the proposed anesthesia with the patient or authorized representative who has indicated his/her understanding and acceptance.   Dental advisory given  Plan Discussed with: CRNA  Anesthesia Plan Comments:         Anesthesia Quick Evaluation

## 2015-05-19 ENCOUNTER — Encounter (HOSPITAL_BASED_OUTPATIENT_CLINIC_OR_DEPARTMENT_OTHER): Payer: Self-pay | Admitting: Orthopedic Surgery

## 2015-06-18 ENCOUNTER — Other Ambulatory Visit: Payer: Self-pay | Admitting: Urology

## 2015-06-21 NOTE — Patient Instructions (Addendum)
Mark Zamora  06/21/2015       YOUR PROCEDURE IS SCHEDULED ON :  06/25/15  REPORT TO  HOSPITAL MAIN ENTRANCE FOLLOW SIGNS TO EAST ELEVATOR - GO TO 3rd FLOOR CHECK IN AT 3 EAST NURSES STATION (SHORT STAY) AT:  5:30 AM  CALL THIS NUMBER IF YOU HAVE PROBLEMS THE MORNING OF SURGERY [phone]  REMEMBER:ONLY 1 PER PERSON MAY GO TO SHORT STAY WITH YOU TO GET READY THE MORNING OF YOUR SURGERY  DO NOT EAT FOOD OR DRINK LIQUIDS AFTER MIDNIGHT  TAKE THESE MEDICINES THE MORNING OF SURGERY: AMLODIPINE / TIZANIDINE / PRILOSEC  BRING C-PAP TUBING AND MASK TO HOSPITAL  YOU MAY NOT HAVE ANY METAL ON YOUR BODY INCLUDING HAIR PINS AND PIERCING'S. DO NOT WEAR JEWELRY, MAKEUP, LOTIONS, POWDERS OR PERFUMES. DO NOT WEAR NAIL POLISH. DO NOT SHAVE 48 HRS PRIOR TO SURGERY. MEN MAY SHAVE FACE AND NECK.  DO NOT [name] IS NOT RESPONSIBLE FOR VALUABLES.  CONTACTS, DENTURES OR PARTIALS MAY NOT BE WORN TO SURGERY. LEAVE SUITCASE IN CAR. CAN BE BROUGHT TO ROOM AFTER SURGERY.  PATIENTS DISCHARGED THE DAY OF SURGERY WILL NOT BE ALLOWED TO DRIVE HOME.  PLEASE READ OVER THE FOLLOWING INSTRUCTION SHEETS _________________________________________________________________________________                                          Mark Zamora - PREPARING FOR SURGERY  Before surgery, you can play an important role.  Because skin is not sterile, your skin needs to be as free of germs as possible.  You can reduce the number of germs on your skin by washing with CHG (chlorahexidine gluconate) soap before surgery.  CHG is an antiseptic cleaner which kills germs and bonds with the skin to continue killing germs even after washing. Please DO NOT use if you have an allergy to CHG or antibacterial soaps.  If your skin becomes reddened/irritated stop using the CHG and inform your nurse when you arrive at Short Stay. Do not shave (including legs and underarms)  for at least 48 hours prior to the first CHG shower.  You may shave your face. Please follow these instructions carefully:   1.  Shower with CHG Soap the night before surgery and the  morning of Surgery.   2.  If you choose to wash your hair, wash your hair first as usual with your  normal  Shampoo.   3.  After you shampoo, rinse your hair and body thoroughly to remove the  shampoo.                                         4.  Use CHG as you would any other liquid soap.  You can apply chg directly  to the skin and wash . Gently wash with scrungie or clean wascloth    5.  Apply the CHG Soap to your body ONLY FROM THE NECK DOWN.   Do not use on open                           Wound or open sores. Avoid contact with eyes, ears mouth and genitals (private parts).  Genitals (private parts) with your normal soap.              6.  Wash thoroughly, paying special attention to the area where your surgery  will be performed.   7.  Thoroughly rinse your body with warm water from the neck down.   8.  DO NOT shower/wash with your normal soap after using and rinsing off  the CHG Soap .                9.  Pat yourself dry with a clean towel.             10.  Wear clean night clothes to bed after shower             11.  Place clean sheets on your bed the night of your first shower and do not  sleep with pets.  Day of Surgery : Do not apply any lotions/deodorants the morning of surgery.  Please wear clean clothes to the hospital/surgery center.  FAILURE TO FOLLOW THESE INSTRUCTIONS MAY RESULT IN THE CANCELLATION OF YOUR SURGERY    PATIENT SIGNATURE_________________________________  ______________________________________________________________________

## 2015-06-22 ENCOUNTER — Encounter (HOSPITAL_COMMUNITY)
Admission: RE | Admit: 2015-06-22 | Discharge: 2015-06-22 | Disposition: A | Discharge status: Home / Self Care | Payer: Medicare Other | Source: Ambulatory Visit | Attending: Urology | Admitting: Urology

## 2015-06-22 ENCOUNTER — Encounter (HOSPITAL_COMMUNITY): Payer: Self-pay

## 2015-06-22 ENCOUNTER — Other Ambulatory Visit (HOSPITAL_COMMUNITY): Payer: Self-pay | Admitting: *Deleted

## 2015-06-22 DIAGNOSIS — R3914 Feeling of incomplete bladder emptying: Secondary | ICD-10-CM | POA: Diagnosis not present

## 2015-06-22 DIAGNOSIS — E785 Hyperlipidemia, unspecified: Secondary | ICD-10-CM | POA: Diagnosis not present

## 2015-06-22 DIAGNOSIS — Z79899 Other long term (current) drug therapy: Secondary | ICD-10-CM | POA: Diagnosis not present

## 2015-06-22 DIAGNOSIS — J309 Allergic rhinitis, unspecified: Secondary | ICD-10-CM | POA: Diagnosis not present

## 2015-06-22 DIAGNOSIS — N401 Enlarged prostate with lower urinary tract symptoms: Secondary | ICD-10-CM | POA: Diagnosis not present

## 2015-06-22 DIAGNOSIS — I10 Essential (primary) hypertension: Secondary | ICD-10-CM | POA: Diagnosis not present

## 2015-06-22 DIAGNOSIS — Z87891 Personal history of nicotine dependence: Secondary | ICD-10-CM | POA: Diagnosis not present

## 2015-06-22 DIAGNOSIS — Z85828 Personal history of other malignant neoplasm of skin: Secondary | ICD-10-CM | POA: Diagnosis not present

## 2015-06-22 DIAGNOSIS — M549 Dorsalgia, unspecified: Secondary | ICD-10-CM | POA: Diagnosis not present

## 2015-06-22 DIAGNOSIS — G4733 Obstructive sleep apnea (adult) (pediatric): Secondary | ICD-10-CM | POA: Diagnosis not present

## 2015-06-22 DIAGNOSIS — R351 Nocturia: Secondary | ICD-10-CM | POA: Diagnosis not present

## 2015-06-22 DIAGNOSIS — N329 Bladder disorder, unspecified: Secondary | ICD-10-CM | POA: Diagnosis present

## 2015-06-22 DIAGNOSIS — G4700 Insomnia, unspecified: Secondary | ICD-10-CM | POA: Diagnosis not present

## 2015-06-22 DIAGNOSIS — K219 Gastro-esophageal reflux disease without esophagitis: Secondary | ICD-10-CM | POA: Diagnosis not present

## 2015-06-22 DIAGNOSIS — Z888 Allergy status to other drugs, medicaments and biological substances status: Secondary | ICD-10-CM | POA: Diagnosis not present

## 2015-06-22 DIAGNOSIS — G8929 Other chronic pain: Secondary | ICD-10-CM | POA: Diagnosis not present

## 2015-06-22 DIAGNOSIS — Z7982 Long term (current) use of aspirin: Secondary | ICD-10-CM | POA: Diagnosis not present

## 2015-06-22 HISTORY — DX: Other chronic pain: G89.29

## 2015-06-22 HISTORY — DX: Malignant (primary) neoplasm, unspecified: C80.1

## 2015-06-22 HISTORY — DX: Constipation, unspecified: K59.00

## 2015-06-22 HISTORY — DX: Personal history of other malignant neoplasm of skin: Z85.828

## 2015-06-22 HISTORY — DX: Hyperlipidemia, unspecified: E78.5

## 2015-06-22 HISTORY — DX: Nocturia: R35.1

## 2015-06-22 HISTORY — DX: Dorsalgia, unspecified: M54.9

## 2015-06-22 LAB — BASIC METABOLIC PANEL
Anion gap: 10 (ref 5–15)
BUN: 10 mg/dL (ref 6–20)
CALCIUM: 9.3 mg/dL (ref 8.9–10.3)
CO2: 28 mmol/L (ref 22–32)
Chloride: 96 mmol/L — ABNORMAL LOW (ref 101–111)
Creatinine, Ser: 0.78 mg/dL (ref 0.61–1.24)
GFR calc Af Amer: >60 mL/min (ref >60)
GFR calc non Af Amer: >60 mL/min (ref >60)
Glucose, Bld: 98 mg/dL (ref 65–99)
Potassium: 4 mmol/L (ref 3.5–5.1)
Sodium: 134 mmol/L — ABNORMAL LOW (ref 135–145)

## 2015-06-22 LAB — CBC
HEMATOCRIT: 41.5 % (ref 39.0–52.0)
Hemoglobin: 13.9 g/dL (ref 13.0–17.0)
MCH: 29 pg (ref 26.0–34.0)
MCHC: 33.5 g/dL (ref 30.0–36.0)
MCV: 86.6 fL (ref 78.0–100.0)
Platelets: 249 10*3/uL (ref 150–400)
RBC: 4.79 MIL/uL (ref 4.22–5.81)
RDW: 13.8 % (ref 11.5–15.5)
WBC: 8.3 10*3/uL (ref 4.0–10.5)

## 2015-06-22 NOTE — Progress Notes (Signed)
DISREGARD VITALS DONE 1344 ENTERED ON WRONG PATIENT

## 2015-06-25 ENCOUNTER — Inpatient Hospital Stay (HOSPITAL_COMMUNITY): Payer: Medicare Other | Admitting: Certified Registered"

## 2015-06-25 ENCOUNTER — Encounter (HOSPITAL_COMMUNITY): Payer: Self-pay | Admitting: *Deleted

## 2015-06-25 ENCOUNTER — Encounter (HOSPITAL_COMMUNITY)
Admission: RE | Disposition: A | Discharge status: Home / Self Care | Payer: Self-pay | Source: Ambulatory Visit | Attending: Urology

## 2015-06-25 ENCOUNTER — Ambulatory Visit (HOSPITAL_COMMUNITY)
Admission: RE | Admit: 2015-06-25 | Discharge: 2015-06-26 | Disposition: A | Discharge status: Home / Self Care | Payer: Medicare Other | Source: Ambulatory Visit | Attending: Urology | Admitting: Urology

## 2015-06-25 DIAGNOSIS — G8929 Other chronic pain: Secondary | ICD-10-CM | POA: Insufficient documentation

## 2015-06-25 DIAGNOSIS — Z888 Allergy status to other drugs, medicaments and biological substances status: Secondary | ICD-10-CM | POA: Insufficient documentation

## 2015-06-25 DIAGNOSIS — N401 Enlarged prostate with lower urinary tract symptoms: Principal | ICD-10-CM | POA: Insufficient documentation

## 2015-06-25 DIAGNOSIS — E785 Hyperlipidemia, unspecified: Secondary | ICD-10-CM | POA: Insufficient documentation

## 2015-06-25 DIAGNOSIS — Z7982 Long term (current) use of aspirin: Secondary | ICD-10-CM | POA: Insufficient documentation

## 2015-06-25 DIAGNOSIS — G4733 Obstructive sleep apnea (adult) (pediatric): Secondary | ICD-10-CM | POA: Insufficient documentation

## 2015-06-25 DIAGNOSIS — R351 Nocturia: Secondary | ICD-10-CM | POA: Diagnosis not present

## 2015-06-25 DIAGNOSIS — N400 Enlarged prostate without lower urinary tract symptoms: Secondary | ICD-10-CM | POA: Diagnosis present

## 2015-06-25 DIAGNOSIS — Z87891 Personal history of nicotine dependence: Secondary | ICD-10-CM | POA: Insufficient documentation

## 2015-06-25 DIAGNOSIS — N329 Bladder disorder, unspecified: Secondary | ICD-10-CM | POA: Insufficient documentation

## 2015-06-25 DIAGNOSIS — G4700 Insomnia, unspecified: Secondary | ICD-10-CM | POA: Insufficient documentation

## 2015-06-25 DIAGNOSIS — K219 Gastro-esophageal reflux disease without esophagitis: Secondary | ICD-10-CM | POA: Insufficient documentation

## 2015-06-25 DIAGNOSIS — M549 Dorsalgia, unspecified: Secondary | ICD-10-CM | POA: Insufficient documentation

## 2015-06-25 DIAGNOSIS — Z79899 Other long term (current) drug therapy: Secondary | ICD-10-CM | POA: Insufficient documentation

## 2015-06-25 DIAGNOSIS — J309 Allergic rhinitis, unspecified: Secondary | ICD-10-CM | POA: Insufficient documentation

## 2015-06-25 DIAGNOSIS — R3914 Feeling of incomplete bladder emptying: Secondary | ICD-10-CM | POA: Insufficient documentation

## 2015-06-25 DIAGNOSIS — I10 Essential (primary) hypertension: Secondary | ICD-10-CM | POA: Insufficient documentation

## 2015-06-25 DIAGNOSIS — Z85828 Personal history of other malignant neoplasm of skin: Secondary | ICD-10-CM | POA: Insufficient documentation

## 2015-06-25 HISTORY — PX: TRANSURETHRAL RESECTION OF PROSTATE: SHX73

## 2015-06-25 SURGERY — TRANSURETHRAL RESECTION OF THE PROSTATE WITH GYRUS INSTRUMENTS
Anesthesia: General

## 2015-06-25 MED ORDER — SODIUM CHLORIDE 0.9 % IV SOLN
INTRAVENOUS | Status: DC
  Administered 2015-06-25 (×2): via INTRAVENOUS

## 2015-06-25 MED ORDER — POLYETHYLENE GLYCOL 3350 17 G PO PACK
17.0000 g | PACK | Freq: Every day | ORAL | Status: DC | PRN
  Administered 2015-06-26: 17 g via ORAL
  Filled 2015-06-25: qty 1

## 2015-06-25 MED ORDER — ADULT MULTIVITAMIN W/MINERALS CH
1.0000 | ORAL_TABLET | Freq: Every morning | ORAL | Status: DC
  Administered 2015-06-26: 1 via ORAL
  Filled 2015-06-25 (×2): qty 1

## 2015-06-25 MED ORDER — DEXAMETHASONE SODIUM PHOSPHATE 10 MG/ML IJ SOLN
INTRAMUSCULAR | Status: DC | PRN
  Administered 2015-06-25: 10 mg via INTRAVENOUS

## 2015-06-25 MED ORDER — VITAMIN D3 25 MCG (1000 UNIT) PO TABS
2000.0000 [IU] | ORAL_TABLET | Freq: Every morning | ORAL | Status: DC
  Administered 2015-06-26: 2000 [IU] via ORAL
  Filled 2015-06-25 (×2): qty 2

## 2015-06-25 MED ORDER — ZOLPIDEM TARTRATE 5 MG PO TABS
5.0000 mg | ORAL_TABLET | Freq: Every evening | ORAL | Status: DC | PRN
  Administered 2015-06-25: 5 mg via ORAL
  Filled 2015-06-25: qty 1

## 2015-06-25 MED ORDER — SODIUM CHLORIDE 0.9 % IR SOLN
3000.0000 mL | Status: DC
  Administered 2015-06-25: 3000 mL

## 2015-06-25 MED ORDER — POTASSIUM CHLORIDE CRYS ER 20 MEQ PO TBCR
40.0000 meq | EXTENDED_RELEASE_TABLET | Freq: Two times a day (BID) | ORAL | Status: DC
  Administered 2015-06-25 – 2015-06-26 (×3): 40 meq via ORAL
  Filled 2015-06-25 (×3): qty 2

## 2015-06-25 MED ORDER — MEPERIDINE HCL 50 MG/ML IJ SOLN: 6.2500 mg | INTRAMUSCULAR | Status: DC | PRN

## 2015-06-25 MED ORDER — LIDOCAINE HCL (CARDIAC) 20 MG/ML IV SOLN
INTRAVENOUS | Status: AC
  Filled 2015-06-25: qty 5

## 2015-06-25 MED ORDER — LACTATED RINGERS IV SOLN: INTRAVENOUS | Status: DC

## 2015-06-25 MED ORDER — EPHEDRINE SULFATE 50 MG/ML IJ SOLN
INTRAMUSCULAR | Status: AC
  Filled 2015-06-25: qty 1

## 2015-06-25 MED ORDER — MAGNESIUM CITRATE PO SOLN: 1.0000 | Freq: Once | ORAL | Status: AC | PRN

## 2015-06-25 MED ORDER — BELLADONNA ALKALOIDS-OPIUM 16.2-60 MG RE SUPP
1.0000 | Freq: Four times a day (QID) | RECTAL | Status: DC | PRN
  Administered 2015-06-25 – 2015-06-26 (×2): 1 via RECTAL
  Filled 2015-06-25 (×2): qty 1

## 2015-06-25 MED ORDER — FENTANYL CITRATE (PF) 100 MCG/2ML IJ SOLN
25.0000 ug | INTRAMUSCULAR | Status: DC | PRN
  Administered 2015-06-25 (×2): 25 ug via INTRAVENOUS
  Administered 2015-06-25: 50 ug via INTRAVENOUS

## 2015-06-25 MED ORDER — CEFTRIAXONE SODIUM IN DEXTROSE 20 MG/ML IV SOLN
1.0000 g | INTRAVENOUS | Status: DC
  Administered 2015-06-26: 1 g via INTRAVENOUS
  Filled 2015-06-25: qty 50

## 2015-06-25 MED ORDER — ASPIRIN 325 MG PO TABS
325.0000 mg | ORAL_TABLET | Freq: Every morning | ORAL | Status: DC
  Administered 2015-06-26: 325 mg via ORAL
  Filled 2015-06-25: qty 1

## 2015-06-25 MED ORDER — MIDAZOLAM HCL 2 MG/2ML IJ SOLN
INTRAMUSCULAR | Status: AC
  Filled 2015-06-25: qty 2

## 2015-06-25 MED ORDER — BACITRACIN-NEOMYCIN-POLYMYXIN 400-5-5000 EX OINT: 1.0000 "application " | TOPICAL_OINTMENT | Freq: Three times a day (TID) | CUTANEOUS | Status: DC | PRN

## 2015-06-25 MED ORDER — DEXTROSE 5 % IV SOLN
INTRAVENOUS | Status: AC
  Filled 2015-06-25: qty 2

## 2015-06-25 MED ORDER — DOXAZOSIN MESYLATE 8 MG PO TABS
8.0000 mg | ORAL_TABLET | Freq: Every day | ORAL | Status: DC
  Administered 2015-06-25: 8 mg via ORAL
  Filled 2015-06-25: qty 1

## 2015-06-25 MED ORDER — FENTANYL CITRATE (PF) 100 MCG/2ML IJ SOLN
INTRAMUSCULAR | Status: AC
  Filled 2015-06-25: qty 2

## 2015-06-25 MED ORDER — OXYCODONE-ACETAMINOPHEN 5-325 MG PO TABS: 1.0000 | ORAL_TABLET | ORAL | Status: DC | PRN

## 2015-06-25 MED ORDER — SODIUM CHLORIDE 0.9 % IJ SOLN
INTRAMUSCULAR | Status: AC
  Filled 2015-06-25: qty 10

## 2015-06-25 MED ORDER — MIDAZOLAM HCL 5 MG/5ML IJ SOLN
INTRAMUSCULAR | Status: DC | PRN
  Administered 2015-06-25: 1 mg via INTRAVENOUS

## 2015-06-25 MED ORDER — HYDROMORPHONE HCL 1 MG/ML IJ SOLN
INTRAMUSCULAR | Status: AC
  Filled 2015-06-25: qty 1

## 2015-06-25 MED ORDER — HYDROMORPHONE HCL 1 MG/ML IJ SOLN
0.2500 mg | INTRAMUSCULAR | Status: DC | PRN
  Administered 2015-06-25 (×2): 0.5 mg via INTRAVENOUS
  Administered 2015-06-25 (×4): 0.25 mg via INTRAVENOUS

## 2015-06-25 MED ORDER — LIDOCAINE HCL (CARDIAC) 20 MG/ML IV SOLN
INTRAVENOUS | Status: DC | PRN
  Administered 2015-06-25: 100 mg via INTRAVENOUS

## 2015-06-25 MED ORDER — SULFAMETHOXAZOLE-TRIMETHOPRIM 800-160 MG PO TABS: 1.0000 | ORAL_TABLET | Freq: Two times a day (BID) | ORAL | Status: DC

## 2015-06-25 MED ORDER — DIPHENHYDRAMINE HCL 50 MG/ML IJ SOLN: 12.5000 mg | Freq: Four times a day (QID) | INTRAMUSCULAR | Status: DC | PRN

## 2015-06-25 MED ORDER — EPHEDRINE SULFATE 50 MG/ML IJ SOLN
INTRAMUSCULAR | Status: DC | PRN
  Administered 2015-06-25: 5 mg via INTRAVENOUS

## 2015-06-25 MED ORDER — DEXTROSE 5 % IV SOLN
2.0000 g | INTRAVENOUS | Status: AC
  Administered 2015-06-25: 2 g via INTRAVENOUS
  Filled 2015-06-25: qty 2

## 2015-06-25 MED ORDER — PANTOPRAZOLE SODIUM 40 MG PO TBEC
40.0000 mg | DELAYED_RELEASE_TABLET | Freq: Every day | ORAL | Status: DC
  Administered 2015-06-26: 40 mg via ORAL
  Filled 2015-06-25 (×2): qty 1

## 2015-06-25 MED ORDER — PROPOFOL 10 MG/ML IV BOLUS
INTRAVENOUS | Status: DC | PRN
  Administered 2015-06-25: 160 mg via INTRAVENOUS

## 2015-06-25 MED ORDER — SODIUM CHLORIDE 0.9 % IR SOLN
Status: DC | PRN
  Administered 2015-06-25 (×2): 3000 mL
  Administered 2015-06-25: 1000 mL

## 2015-06-25 MED ORDER — FLUTICASONE PROPIONATE 50 MCG/ACT NA SUSP
2.0000 | Freq: Every evening | NASAL | Status: DC | PRN
  Filled 2015-06-25: qty 16

## 2015-06-25 MED ORDER — BISACODYL 10 MG RE SUPP: 10.0000 mg | Freq: Every day | RECTAL | Status: DC | PRN

## 2015-06-25 MED ORDER — FENTANYL CITRATE (PF) 100 MCG/2ML IJ SOLN
INTRAMUSCULAR | Status: DC | PRN
  Administered 2015-06-25 (×4): 50 ug via INTRAVENOUS

## 2015-06-25 MED ORDER — DIPHENHYDRAMINE HCL 12.5 MG/5ML PO ELIX: 12.5000 mg | ORAL_SOLUTION | Freq: Four times a day (QID) | ORAL | Status: DC | PRN

## 2015-06-25 MED ORDER — ACETAMINOPHEN 325 MG PO TABS
650.0000 mg | ORAL_TABLET | ORAL | Status: DC | PRN
  Administered 2015-06-25 – 2015-06-26 (×2): 650 mg via ORAL
  Filled 2015-06-25 (×2): qty 2

## 2015-06-25 MED ORDER — FENTANYL CITRATE (PF) 250 MCG/5ML IJ SOLN
INTRAMUSCULAR | Status: AC
  Filled 2015-06-25: qty 5

## 2015-06-25 MED ORDER — LISINOPRIL 40 MG PO TABS
40.0000 mg | ORAL_TABLET | Freq: Every day | ORAL | Status: DC
  Administered 2015-06-25 – 2015-06-26 (×2): 40 mg via ORAL
  Filled 2015-06-25 (×2): qty 1

## 2015-06-25 MED ORDER — PROPOFOL 10 MG/ML IV BOLUS
INTRAVENOUS | Status: AC
  Filled 2015-06-25: qty 20

## 2015-06-25 MED ORDER — SENNA 8.6 MG PO TABS
1.0000 | ORAL_TABLET | Freq: Two times a day (BID) | ORAL | Status: DC
  Administered 2015-06-25 – 2015-06-26 (×2): 8.6 mg via ORAL
  Filled 2015-06-25 (×2): qty 1

## 2015-06-25 MED ORDER — ONDANSETRON HCL 4 MG/2ML IJ SOLN
INTRAMUSCULAR | Status: DC | PRN
  Administered 2015-06-25: 4 mg via INTRAVENOUS

## 2015-06-25 MED ORDER — HYDROMORPHONE HCL 1 MG/ML IJ SOLN
0.5000 mg | INTRAMUSCULAR | Status: DC | PRN
  Administered 2015-06-25 – 2015-06-26 (×2): 1 mg via INTRAVENOUS
  Filled 2015-06-25 (×2): qty 1

## 2015-06-25 MED ORDER — HYDROCHLOROTHIAZIDE 25 MG PO TABS
25.0000 mg | ORAL_TABLET | Freq: Every morning | ORAL | Status: DC
  Administered 2015-06-25 – 2015-06-26 (×2): 25 mg via ORAL
  Filled 2015-06-25 (×2): qty 1

## 2015-06-25 MED ORDER — ONDANSETRON HCL 4 MG/2ML IJ SOLN
INTRAMUSCULAR | Status: AC
  Filled 2015-06-25: qty 2

## 2015-06-25 MED ORDER — ONDANSETRON HCL 4 MG/2ML IJ SOLN
4.0000 mg | INTRAMUSCULAR | Status: DC | PRN
Start: 2015-06-25 — End: 2015-06-26
  Filled 2015-06-25: qty 2

## 2015-06-25 MED ORDER — AMLODIPINE BESYLATE 10 MG PO TABS
10.0000 mg | ORAL_TABLET | Freq: Every morning | ORAL | Status: DC
  Administered 2015-06-26: 10 mg via ORAL
  Filled 2015-06-25: qty 1

## 2015-06-25 MED ORDER — FUROSEMIDE 20 MG PO TABS
20.0000 mg | ORAL_TABLET | Freq: Every morning | ORAL | Status: DC
  Administered 2015-06-25 – 2015-06-26 (×2): 20 mg via ORAL
  Filled 2015-06-25 (×2): qty 1

## 2015-06-25 MED ORDER — PRAVASTATIN SODIUM 80 MG PO TABS
80.0000 mg | ORAL_TABLET | Freq: Every evening | ORAL | Status: DC
  Administered 2015-06-25: 80 mg via ORAL
  Filled 2015-06-25 (×2): qty 1

## 2015-06-25 MED ORDER — LACTATED RINGERS IV SOLN
INTRAVENOUS | Status: DC | PRN
  Administered 2015-06-25 (×2): via INTRAVENOUS

## 2015-06-25 SURGICAL SUPPLY — 18 items
BAG URINE DRAINAGE (UROLOGICAL SUPPLIES) ×3 IMPLANT
BAG URO CATCHER STRL LF (DRAPE) ×3 IMPLANT
CATH FOLEY 3WAY 30CC 22FR (CATHETERS) ×3 IMPLANT
CATH HEMA 3WAY 30CC 22FR COUDE (CATHETERS) IMPLANT
ELECT REM PT RETURN 9FT ADLT (ELECTROSURGICAL)
ELECTRODE REM PT RTRN 9FT ADLT (ELECTROSURGICAL) IMPLANT
GLOVE BIOGEL M STRL SZ7.5 (GLOVE) ×3 IMPLANT
GOWN STRL REUS W/TWL LRG LVL3 (GOWN DISPOSABLE) ×3 IMPLANT
GOWN STRL REUS W/TWL XL LVL3 (GOWN DISPOSABLE) ×3 IMPLANT
HOLDER FOLEY CATH W/STRAP (MISCELLANEOUS) ×3 IMPLANT
KIT ASPIRATION TUBING (SET/KITS/TRAYS/PACK) ×3 IMPLANT
LOOP CUT BIPOLAR 24F LRG (ELECTROSURGICAL) ×3 IMPLANT
MANIFOLD NEPTUNE II (INSTRUMENTS) ×3 IMPLANT
PACK CYSTO (CUSTOM PROCEDURE TRAY) ×3 IMPLANT
SUT ETHILON 3 0 PS 1 (SUTURE) IMPLANT
SYRINGE IRR TOOMEY STRL 70CC (SYRINGE) ×3 IMPLANT
TUBING CONNECTING 10 (TUBING) ×2 IMPLANT
TUBING CONNECTING 10' (TUBING) ×1

## 2015-06-25 NOTE — Anesthesia Postprocedure Evaluation (Signed)
  Anesthesia Post-op Note  Patient: Mark Zamora  Procedure(s) Performed: Procedure(s) (LRB): TRANSURETHRAL RESECTION OF THE PROSTATE WITH GYRUS INSTRUMENTS BLADDER BIOPSY AND FULGURATION (N/A)  Patient Location: PACU  Anesthesia Type: General  Level of Consciousness: awake and alert   Airway and Oxygen Therapy: Patient Spontanous Breathing  Post-op Pain: mild  Post-op Assessment: Post-op Vital signs reviewed, Patient's Cardiovascular Status Stable, Respiratory Function Stable, Patent Airway and No signs of Nausea or vomiting  Last Vitals:  Filed Vitals:   06/25/15 0901  BP: 132/69  Pulse: 79  Temp: 36.7 C  Resp: 12    Post-op Vital Signs: stable   Complications: No apparent anesthesia complications

## 2015-06-25 NOTE — Progress Notes (Signed)
Pt stated that he would self administer CPAP when ready for bed.  Current settings are 12 CMH20 via Pt home nasal mask and tubing.  Pt to notify RT if any assistance is required throughout the night, RT to monitor and assess as needed.

## 2015-06-25 NOTE — H&P (Signed)
Urology Admission H&P  Chief Complaint:  Incomplete emptying  History of Present Illness: [name] is a 69yo with a hx of BPH here for TURP. He has a hx of incomplete emptying. He is on multiple BPH meds and continues to have severe LUTS. He denies fevers/chills/sweats  Past Medical History  Diagnosis Date  . Allergic rhinitis   . Postherpetic neuralgia   . History of chronic sinusitis   . Essential hypertension   . BPH (benign prostatic hypertrophy)   . GERD (gastroesophageal reflux disease)   . OSA on CPAP     uses cpap  . Arthritis     l ankle  . Hyperlipidemia   . Chronic back pain   . Constipation   . Nocturia   . History of skin cancer   . Cancer     HX SKIN CANCER  . Insomnia    Past Surgical History  Procedure Laterality Date  . Knee arthroscopy Left 04-19-2011  . Ankle arthroscopy Left 03/ 2012  . Pilonidal fistulectomy    . Knee arthroscopy with medial menisectomy Right 05/18/2015    Procedure: RIGHT KNEE ARTHROSCOPY WITH  MEDIAL MENISCAL DEBRIDEMENT;  Surgeon: Gaynelle Arabian, MD;  Location: [facility];  Service: Orthopedics;  Laterality: Right;  . Transurethral resection of prostate      Home Medications:  Prescriptions prior to admission  Medication Sig Dispense Refill Last Dose  . acetaminophen (TYLENOL) 325 MG tablet Take 650 mg by mouth every 6 (six) hours as needed for moderate pain or headache.    06/24/2015 at 2000  . amLODipine (NORVASC) 10 MG tablet Take 10 mg by mouth every morning.    06/25/2015 at 0400  . aspirin 325 MG tablet Take 325 mg by mouth every morning.    06/21/2015  . cholecalciferol (VITAMIN D) 1000 UNITS tablet Take 2,000 Units by mouth every morning.    06/21/2015  . Diphenhydramine-PE-APAP (DELSYM COUGH/COLD NIGHT TIME PO) Take 2.5 mLs by mouth at bedtime as needed (cough).    06/22/2015  . doxazosin (CARDURA) 8 MG tablet Take 8 mg by mouth at bedtime.   06/24/2015 at 2300  . fluticasone (FLONASE) 50 MCG/ACT nasal spray  Place 2 sprays into both nostrils at bedtime as needed for allergies or rhinitis.   06/24/2015 at 2300  . furosemide (LASIX) 20 MG tablet Take 20 mg by mouth every morning.    06/24/2015 at 1000  . hydrochlorothiazide (HYDRODIURIL) 25 MG tablet Take 25 mg by mouth every morning.    06/24/2015 at 1000  . lisinopril (PRINIVIL,ZESTRIL) 40 MG tablet Take 40 mg by mouth daily.   06/24/2015 at Unknown time  . meloxicam (MOBIC) 15 MG tablet Take 15 mg by mouth daily with breakfast.   06/24/2015 at 1000  . Multiple Vitamins-Minerals (MULTIVITAMIN WITH MINERALS) tablet Take 1 tablet by mouth every morning.    06/21/2015  . omeprazole (PRILOSEC) 20 MG capsule Take 20 mg by mouth every morning.    06/25/2015 at 0400  . potassium chloride SA (K-DUR,KLOR-CON) 20 MEQ tablet Take 40 mEq by mouth 2 (two) times daily.    06/24/2015 at 1300  . pravastatin (PRAVACHOL) 80 MG tablet Take 80 mg by mouth every evening.    06/24/2015 at 2300  . tiZANidine (ZANAFLEX) 4 MG capsule Take 4 mg by mouth every 8 (eight) hours as needed for muscle spasms.   06/25/2015 at 0400  . vitamin E 100 UNIT capsule Take by mouth every morning.    06/21/2015  .  zolpidem (AMBIEN) 10 MG tablet Take 10 mg by mouth at bedtime as needed for sleep.   06/24/2015 at 2300  . HYDROcodone-acetaminophen (NORCO) 5-325 MG per tablet Take 1-2 tablets by mouth every 6 (six) hours as needed for moderate pain. (Patient not taking: Reported on 06/21/2015) 30 tablet 0    Allergies:  Allergies  Allergen Reactions  . Compazine [Prochlorperazine Edisylate] Anaphylaxis    Pt states "any nausea med ending in "zine" "  . Cymbalta [Duloxetine Hcl] Other (See Comments)    Urinary retention   . Promethazine Anaphylaxis and Other (See Comments)    seizures    Family History  Problem Relation Age of Onset  . CVA Father   . Seizures Father   . Hypercholesterolemia Mother   . Stroke Mother     diagnosed w/DM,CVD   Social History:  reports that he quit smoking about 21  years ago. He does not have any smokeless tobacco history on file. He reports that he drinks about 1.8 oz of alcohol per week. His drug history is not on file.  Review of Systems  Genitourinary: Positive for urgency and frequency.  All other systems reviewed and are negative.   Physical Exam:  Vital signs in last 24 hours: Temp:  [98 F (36.7 C)] 98 F (36.7 C) (07/29 0531) Pulse Rate:  [74] 74 (07/29 0531) Resp:  [18] 18 (07/29 0531) BP: (109)/(59) 109/59 mmHg (07/29 0531) SpO2:  [96 %] 96 % (07/29 0531) Physical Exam  Constitutional: He is oriented to person, place, and time. He appears well-developed and well-nourished.  HENT:  Head: Normocephalic and atraumatic.  Eyes: EOM are normal. Pupils are equal, round, and reactive to light.  Neck: Normal range of motion. Neck supple.  Cardiovascular: Normal rate and regular rhythm.   Respiratory: Effort normal and breath sounds normal.  GI: Soft. He exhibits no distension.  Musculoskeletal: Normal range of motion.  Neurological: He is alert and oriented to person, place, and time.  Skin: Skin is warm and dry.  Psychiatric: He has a normal mood and affect. His behavior is normal. Judgment and thought content normal.    Laboratory Data:  No results found for this or any previous visit (from the past 24 hour(s)). No results found for this or any previous visit (from the past 240 hour(s)). Creatinine:  Recent Labs  06/22/15 1445  CREATININE 0.78     Impression/Assessment:  BPH  Plan:  Risks/benefits/alternatives to TURP was explained to the patient and he understands and wishes to proceed with surgery  Zimere Dunlevy L 06/25/2015, 7:29 AM

## 2015-06-25 NOTE — Anesthesia Procedure Notes (Signed)
Procedure Name: LMA Insertion Date/Time: 06/25/2015 7:45 AM Performed by: Montel Clock Pre-anesthesia Checklist: Patient identified, Emergency Drugs available, Suction available, Patient being monitored and Timeout performed Patient Re-evaluated:Patient Re-evaluated prior to inductionOxygen Delivery Method: Circle system utilized Preoxygenation: Pre-oxygenation with 100% oxygen Intubation Type: IV induction Ventilation: Mask ventilation without difficulty LMA: LMA with gastric port inserted LMA Size: 4.0 Number of attempts: 1 Dental Injury: Teeth and Oropharynx as per pre-operative assessment

## 2015-06-25 NOTE — Anesthesia Preprocedure Evaluation (Addendum)
Anesthesia Evaluation  Patient identified by MRN, date of birth, ID band Patient awake    Reviewed: Allergy & Precautions, NPO status , Patient's Chart, lab work & pertinent test results  History of Anesthesia Complications Negative for: history of anesthetic complications  Airway Mallampati: II  TM Distance: >3 FB Neck ROM: Full    Dental no notable dental hx. (+) Dental Advisory Given   Pulmonary sleep apnea and Continuous Positive Airway Pressure Ventilation , former smoker,  breath sounds clear to auscultation  Pulmonary exam normal       Cardiovascular hypertension, Pt. on medications Normal cardiovascular examRhythm:Regular Rate:Normal     Neuro/Psych negative neurological ROS  negative psych ROS   GI/Hepatic Neg liver ROS, GERD-  Medicated and Controlled,  Endo/Other  negative endocrine ROS  Renal/GU negative Renal ROS  negative genitourinary   Musculoskeletal  (+) Arthritis -, Osteoarthritis,    Abdominal   Peds negative pediatric ROS (+)  Hematology negative hematology ROS (+)   Anesthesia Other Findings   Reproductive/Obstetrics negative OB ROS                            Anesthesia Physical Anesthesia Plan  ASA: II  Anesthesia Plan: General   Post-op Pain Management:    Induction: Intravenous  Airway Management Planned: LMA  Additional Equipment:   Intra-op Plan:   Post-operative Plan: Extubation in OR  Informed Consent: I have reviewed the patients History and Physical, chart, labs and discussed the procedure including the risks, benefits and alternatives for the proposed anesthesia with the patient or authorized representative who has indicated his/her understanding and acceptance.   Dental advisory given  Plan Discussed with: CRNA  Anesthesia Plan Comments:        Anesthesia Quick Evaluation

## 2015-06-25 NOTE — Transfer of Care (Signed)
Immediate Anesthesia Transfer of Care Note  Patient: Mark Zamora  Procedure(s) Performed: Procedure(s): TRANSURETHRAL RESECTION OF THE PROSTATE WITH GYRUS INSTRUMENTS BLADDER BIOPSY AND FULGURATION (N/A)  Patient Location: PACU  Anesthesia Type:General  Level of Consciousness:  sedated, patient cooperative and responds to stimulation  Airway & Oxygen Therapy:Patient Spontanous Breathing and Patient connected to face mask oxgen  Post-op Assessment:  Report given to PACU RN and Post -op Vital signs reviewed and stable  Post vital signs:  Reviewed and stable  Last Vitals:  Filed Vitals:   06/25/15 0531  BP: 109/59  Pulse: 74  Temp: 36.7 C  Resp: 18    Complications: No apparent anesthesia complications

## 2015-06-25 NOTE — Brief Op Note (Signed)
06/25/2015  9:24 AM  PATIENT:  Mark Zamora  69 y.o. male  PRE-OPERATIVE DIAGNOSIS:  BENIGN PROSTATIC HYPERPLASIA  POST-OPERATIVE DIAGNOSIS:  benign prostatic hyperplasia  PROCEDURE:  Procedure(s): TRANSURETHRAL RESECTION OF THE PROSTATE WITH GYRUS INSTRUMENTS BLADDER BIOPSY AND FULGURATION (N/A)  SURGEON:  Surgeon(s) and Role:    * Cleon Gustin, MD - Primary  PHYSICIAN ASSISTANT:   ASSISTANTS: none   ANESTHESIA:   general  EBL:  Total I/O In: 1000 [I.V.:1000] Out: -   BLOOD ADMINISTERED:none  DRAINS: Urinary Catheter (Foley)   LOCAL MEDICATIONS USED:  NONE  SPECIMEN:  Source of Specimen:  bladder biopsy and prostate chips  DISPOSITION OF SPECIMEN:  PATHOLOGY  COUNTS:  YES  TOURNIQUET:  * No tourniquets in log *  DICTATION: .Note written in EPIC  PLAN OF CARE: Admit for overnight observation  PATIENT DISPOSITION:  PACU - hemodynamically stable.   Delay start of Pharmacological VTE agent (>24hrs) due to surgical blood loss or risk of bleeding: not applicable

## 2015-06-26 ENCOUNTER — Emergency Department (HOSPITAL_COMMUNITY)
Admission: EM | Admit: 2015-06-26 | Discharge: 2015-06-26 | Disposition: A | Discharge status: Home / Self Care | Payer: Medicare Other | Source: Home / Self Care | Attending: Emergency Medicine | Admitting: Emergency Medicine

## 2015-06-26 ENCOUNTER — Encounter (HOSPITAL_COMMUNITY): Payer: Self-pay | Admitting: Emergency Medicine

## 2015-06-26 DIAGNOSIS — Z7982 Long term (current) use of aspirin: Secondary | ICD-10-CM | POA: Insufficient documentation

## 2015-06-26 DIAGNOSIS — T83098A Other mechanical complication of other indwelling urethral catheter, initial encounter: Secondary | ICD-10-CM | POA: Insufficient documentation

## 2015-06-26 DIAGNOSIS — Z87438 Personal history of other diseases of male genital organs: Secondary | ICD-10-CM

## 2015-06-26 DIAGNOSIS — T839XXA Unspecified complication of genitourinary prosthetic device, implant and graft, initial encounter: Secondary | ICD-10-CM

## 2015-06-26 DIAGNOSIS — Z87891 Personal history of nicotine dependence: Secondary | ICD-10-CM | POA: Insufficient documentation

## 2015-06-26 DIAGNOSIS — E785 Hyperlipidemia, unspecified: Secondary | ICD-10-CM

## 2015-06-26 DIAGNOSIS — Z8709 Personal history of other diseases of the respiratory system: Secondary | ICD-10-CM | POA: Insufficient documentation

## 2015-06-26 DIAGNOSIS — Z85828 Personal history of other malignant neoplasm of skin: Secondary | ICD-10-CM

## 2015-06-26 DIAGNOSIS — G8929 Other chronic pain: Secondary | ICD-10-CM

## 2015-06-26 DIAGNOSIS — Y846 Urinary catheterization as the cause of abnormal reaction of the patient, or of later complication, without mention of misadventure at the time of the procedure: Secondary | ICD-10-CM

## 2015-06-26 DIAGNOSIS — Z791 Long term (current) use of non-steroidal anti-inflammatories (NSAID): Secondary | ICD-10-CM | POA: Insufficient documentation

## 2015-06-26 DIAGNOSIS — M1990 Unspecified osteoarthritis, unspecified site: Secondary | ICD-10-CM

## 2015-06-26 DIAGNOSIS — K219 Gastro-esophageal reflux disease without esophagitis: Secondary | ICD-10-CM | POA: Insufficient documentation

## 2015-06-26 DIAGNOSIS — G4733 Obstructive sleep apnea (adult) (pediatric): Secondary | ICD-10-CM

## 2015-06-26 DIAGNOSIS — Z9981 Dependence on supplemental oxygen: Secondary | ICD-10-CM

## 2015-06-26 DIAGNOSIS — I10 Essential (primary) hypertension: Secondary | ICD-10-CM | POA: Insufficient documentation

## 2015-06-26 DIAGNOSIS — Z79899 Other long term (current) drug therapy: Secondary | ICD-10-CM | POA: Insufficient documentation

## 2015-06-26 LAB — URINALYSIS, ROUTINE W REFLEX MICROSCOPIC
BILIRUBIN URINE: NEGATIVE
GLUCOSE, UA: NEGATIVE mg/dL
Ketones, ur: NEGATIVE mg/dL
Nitrite: NEGATIVE
PROTEIN: 100 mg/dL — AB
Specific Gravity, Urine: 1.02 (ref 1.005–1.030)
Urobilinogen, UA: 1 mg/dL (ref 0.0–1.0)
pH: 6.5 (ref 5.0–8.0)

## 2015-06-26 LAB — CBC
HEMATOCRIT: 38.1 % — AB (ref 39.0–52.0)
Hemoglobin: 12.7 g/dL — ABNORMAL LOW (ref 13.0–17.0)
MCH: 29.4 pg (ref 26.0–34.0)
MCHC: 33.3 g/dL (ref 30.0–36.0)
MCV: 88.2 fL (ref 78.0–100.0)
Platelets: 272 10*3/uL (ref 150–400)
RBC: 4.32 MIL/uL (ref 4.22–5.81)
RDW: 14.1 % (ref 11.5–15.5)
WBC: 13.6 10*3/uL — ABNORMAL HIGH (ref 4.0–10.5)

## 2015-06-26 LAB — BASIC METABOLIC PANEL
Anion gap: 10 (ref 5–15)
BUN: 13 mg/dL (ref 6–20)
CO2: 28 mmol/L (ref 22–32)
CREATININE: 0.79 mg/dL (ref 0.61–1.24)
Calcium: 9.1 mg/dL (ref 8.9–10.3)
Chloride: 97 mmol/L — ABNORMAL LOW (ref 101–111)
GFR calc non Af Amer: >60 mL/min (ref >60)
Glucose, Bld: 110 mg/dL — ABNORMAL HIGH (ref 65–99)
POTASSIUM: 4 mmol/L (ref 3.5–5.1)
Sodium: 135 mmol/L (ref 135–145)

## 2015-06-26 LAB — URINE MICROSCOPIC-ADD ON

## 2015-06-26 NOTE — ED Notes (Signed)
Performed manual catheter irrigation as ordered using approximately 1104mL of normal saline.  Pt tolerated the procedure well and stated that he could "feel something break loose" near the drainage tip inside his bladder.  No clots drained out, but all of the irrigation fluid immediately drained into his bag.  Pt was then instructed on how to switch his drainage bag, and he performed the task independently.  Pt states he has no further questions or concerns and that he is ready for discharge.  MD notified.

## 2015-06-26 NOTE — ED Provider Notes (Signed)
CSN: [id number]     Arrival date & time 06/26/15  2003 History   First MD Initiated Contact with Patient 06/26/15 2017     Chief Complaint  Patient presents with  . catheter issue      (Consider location/radiation/quality/duration/timing/severity/associated sxs/prior Treatment) HPI Comments: Patient is a 69 year old male with a history of BPH and recent TURP procedure done yesterday. He was discharged from the hospital today with a Foley catheter in place. Approximately 3 hours ago he noted decreased output from the catheter bag and some mild pressure in the penis. He denies any abdominal pain, fever or other complaints. He is requesting further education on how to change the Foley catheter bags as well.  The history is provided by the patient.    Past Medical History  Diagnosis Date  . Allergic rhinitis   . Postherpetic neuralgia   . History of chronic sinusitis   . Essential hypertension   . BPH (benign prostatic hypertrophy)   . GERD (gastroesophageal reflux disease)   . OSA on CPAP     uses cpap  . Arthritis     l ankle  . Hyperlipidemia   . Chronic back pain   . Constipation   . Nocturia   . History of skin cancer   . Cancer     HX SKIN CANCER  . Insomnia    Past Surgical History  Procedure Laterality Date  . Knee arthroscopy Left 04-19-2011  . Ankle arthroscopy Left 03/ 2012  . Pilonidal fistulectomy    . Knee arthroscopy with medial menisectomy Right 05/18/2015    Procedure: RIGHT KNEE ARTHROSCOPY WITH  MEDIAL MENISCAL DEBRIDEMENT;  Surgeon: Gaynelle Arabian, MD;  Location: [facility];  Service: Orthopedics;  Laterality: Right;  . Transurethral resection of prostate     Family History  Problem Relation Age of Onset  . CVA Father   . Seizures Father   . Hypercholesterolemia Mother   . Stroke Mother     diagnosed w/DM,CVD   History  Substance Use Topics  . Smoking status: Former Smoker    Quit date: 05/10/1994  . Smokeless tobacco: Not on file   . Alcohol Use: 1.8 oz/week    3 Shots of liquor per week     Comment: 2-3 DRINKS PER WEEK    Review of Systems  All other systems reviewed and are negative.     Allergies  Compazine; Cymbalta; and Promethazine  Home Medications   Prior to Admission medications   Medication Sig Start Date End Date Taking? Authorizing Provider  acetaminophen (TYLENOL) 325 MG tablet Take 650 mg by mouth every 6 (six) hours as needed for moderate pain or headache.     Historical Provider, MD  amLODipine (NORVASC) 10 MG tablet Take 10 mg by mouth every morning.     Historical Provider, MD  aspirin 325 MG tablet Take 325 mg by mouth every morning.     Historical Provider, MD  cholecalciferol (VITAMIN D) 1000 UNITS tablet Take 2,000 Units by mouth every morning.     Historical Provider, MD  Diphenhydramine-PE-APAP (DELSYM COUGH/COLD NIGHT TIME PO) Take 2.5 mLs by mouth at bedtime as needed (cough).     Historical Provider, MD  doxazosin (CARDURA) 8 MG tablet Take 8 mg by mouth at bedtime.    Historical Provider, MD  fluticasone (FLONASE) 50 MCG/ACT nasal spray Place 2 sprays into both nostrils at bedtime as needed for allergies or rhinitis.    Historical Provider, MD  furosemide (LASIX) 20  MG tablet Take 20 mg by mouth every morning.     Historical Provider, MD  hydrochlorothiazide (HYDRODIURIL) 25 MG tablet Take 25 mg by mouth every morning.     Historical Provider, MD  HYDROcodone-acetaminophen (NORCO) 5-325 MG per tablet Take 1-2 tablets by mouth every 6 (six) hours as needed for moderate pain. Patient not taking: Reported on 06/21/2015 05/18/15   Gaynelle Arabian, MD  lisinopril (PRINIVIL,ZESTRIL) 40 MG tablet Take 40 mg by mouth daily.    Historical Provider, MD  meloxicam (MOBIC) 15 MG tablet Take 15 mg by mouth daily with breakfast.    Historical Provider, MD  Multiple Vitamins-Minerals (MULTIVITAMIN WITH MINERALS) tablet Take 1 tablet by mouth every morning.     Historical Provider, MD  omeprazole  (PRILOSEC) 20 MG capsule Take 20 mg by mouth every morning.     Historical Provider, MD  oxyCODONE-acetaminophen (PERCOCET/ROXICET) 5-325 MG per tablet Take 1-2 tablets by mouth every 4 (four) hours as needed for moderate pain. 06/25/15   Cleon Gustin, MD  potassium chloride SA (K-DUR,KLOR-CON) 20 MEQ tablet Take 40 mEq by mouth 2 (two) times daily.     Historical Provider, MD  pravastatin (PRAVACHOL) 80 MG tablet Take 80 mg by mouth every evening.     Historical Provider, MD  sulfamethoxazole-trimethoprim (BACTRIM DS,SEPTRA DS) 800-160 MG per tablet Take 1 tablet by mouth 2 (two) times daily. 06/25/15   Cleon Gustin, MD  tiZANidine (ZANAFLEX) 4 MG capsule Take 4 mg by mouth every 8 (eight) hours as needed for muscle spasms.    Historical Provider, MD  vitamin E 100 UNIT capsule Take by mouth every morning.     Historical Provider, MD  zolpidem (AMBIEN) 10 MG tablet Take 10 mg by mouth at bedtime as needed for sleep.    Historical Provider, MD   BP 136/69 mmHg  Pulse 77  Temp(Src) 97.6 F (36.4 C) (Oral)  Resp 18  SpO2 96% Physical Exam  Constitutional: He is oriented to person, place, and time. He appears well-developed and well-nourished. No distress.  HENT:  Head: Normocephalic and atraumatic.  Eyes: EOM are normal. Pupils are equal, round, and reactive to light.  Cardiovascular: Normal rate.   Pulmonary/Chest: Effort normal.  Genitourinary:  Foley catheter present in the penis. No overflow drainage from the penis. Urine in the catheter bag is dark in appearance without blood clot  Neurological: He is alert and oriented to person, place, and time.  Skin: Skin is warm and dry.  Nursing note and vitals reviewed.   ED Course  Procedures (including critical care time) Labs Review Labs Reviewed  URINALYSIS, ROUTINE W REFLEX MICROSCOPIC (NOT AT [facility])    Imaging Review No results found.   EKG Interpretation None      MDM   Final diagnoses:  Foley catheter problem,  initial encounter    Patient is a 69 year old male who recently had a TURP procedure performed yesterday was discharged from the hospital today with a Foley catheter. He states a proximally 3 hours ago it seems like the catheter had decreased output of urine. The urine is now dark in appearance but denies any blood clots present. He has some mild pressure in his penis behind the catheter but otherwise no other complaints. Bedside ultrasound shows no evidence of urinary retention at this time. There is some dark urine in the bag. Will irrigate  9:10 PM After irrigation patient felt much better. There is no clot or evidence of debris removed. He was  discharged home  Blanchie Dessert, MD 06/26/15 2110

## 2015-06-26 NOTE — ED Notes (Signed)
Pt from home c/o catheter not draining. Pt had TURP on 7/29. Per patient he emptied catheter drainage bag around 1900. Amber colored urine noticed. Pt concerned that urine output is not enough. He is also c/o penile pain from catheter. Pt denies abdominal distention. No clots noted in bag. Pt anxious and requesting catheter to be irrigated and for [place] to show him how to change his catheter bags.

## 2015-06-26 NOTE — Progress Notes (Signed)
Urology Progress Note  1 Day Post-Op   Subjective: AF VSS. Pr\t scheduled for d/c today    No acute urologic events overnight. Ambulation:   positive Flatus:    positive Bowel movement  positive  Pain: complete resolution  Objective:  Blood pressure 130/56, pulse 55, temperature 97.6 F (36.4 C), temperature source Oral, resp. rate 18, height 5\' 5"  (1.651 m), weight 104 kg (229 lb 4.5 oz), SpO2 98 %.  Physical Exam:  General:  No acute distress, awake Extremities: extremities normal, atraumatic, no cyanosis or edema Genitourinary:  Normal BUS Foley:  remains    I/O last 3 completed shifts: In: 8228.3 [P.O.:480; I.V.:2848.3; Other:4900] Out: 9250 [Urine:9250]  Recent Labs     06/26/15  0410  HGB  12.7*  WBC  13.6*  PLT  272    Recent Labs     06/26/15  0410  NA  135  K  4.0  CL  97*  CO2  28  BUN  13  CREATININE  0.79  CALCIUM  9.1  GFRNONAA  >60  GFRAA  >60     No results for input(s): INR, APTT in the last 72 hours.  Invalid input(s): PT   Invalid input(s): ABG  Assessment/Plan:  Catheter not removed. Pt to RTC Monday for foley removal.

## 2015-06-26 NOTE — Progress Notes (Signed)
Pt discharged home with follow up with urology on the 4th of August. Pt given RX for percocet and bactrim. Leg bag applied and instructions on emptying it shown. Pt in agreement with plans.

## 2015-06-27 NOTE — Op Note (Signed)
Preoperative diagnosis: BPH  Postoperative diagnosis: BPH and posterior wall bladder lesion  Procedure: 1 cystoscopy 2.bladder biopsy with fulgeration x 5 3. Transurethral resection of the prostate  Attending: Rosie Fate  Anesthesia: General  Estimated blood loss: Minimal  Drains: 22 French foley  Specimens: 1. Bladder biopsy 2. Prostate Chips  Antibiotics: Rocephin  Findings: multiple areas of erythema on posterior bladder wall concerning for malignancy. Trilobar prostate enlargement. Ureteral orifices in normal anatomic location.   Indications: Patient is a 69 year old male with a history of BPH and elevated PVR.  After discussing treatment options, they decided proceed with transurethral resection of the prostate.  Procedure her in detail: The patient was brought to the operating room and a brief timeout was done to ensure correct patient, correct procedure, correct site.  General anesthesia was administered patient was placed in dorsal lithotomy position.  Their genitalia was then prepped and draped in usual sterile fashion.  A rigid 19 French cystoscope was passed in the urethra and the bladder.  Bladder was inspected and we noted a posterior wall erythematous lesions.  the ureteral orifices were in the normal orthotopic locations. Using the cold cup biopsy we obtained 5 biopsies of the lesions. We then removed the cystoscope and placed a resectoscope into the bladder. We cauterized the biopsy sites.  Once this was complete we turned our attention to the prostate resection. Using the bipolar resectoscope we resected the median lobe first from the bladder neck to the verumontanum. We then started at the 12 oclock position on the left lobe and resection to the 6 o'clock position from the bladder neck to the verumontanum. We then did the same resection of the right lobe. Once the resection was complete we then cauterized individual bleeders. We then removed the prostate chips and  sent them for pathology.  We then re-inspected the prostatic fossa and found no residual bleeding.  the bladder was then drained, a 22 French foley was placed and this concluded the procedure which was well tolerated by patient.  Complications: None  Condition: Stable, extubated, transferred to PACU  Plan: Patient is admitted overnight with continuous bladder irrigation. If their urine is clear tomorrow they will be discharged home and followup in 5 days for foley catheter removal and pathology discussion.

## 2015-06-28 ENCOUNTER — Encounter (HOSPITAL_COMMUNITY): Payer: Self-pay | Admitting: Urology

## 2016-03-27 DIAGNOSIS — Z87898 Personal history of other specified conditions: Secondary | ICD-10-CM

## 2016-03-27 DIAGNOSIS — E876 Hypokalemia: Secondary | ICD-10-CM

## 2016-03-27 HISTORY — DX: Hypokalemia: E87.6

## 2016-03-27 HISTORY — DX: Personal history of other specified conditions: Z87.898

## 2016-04-01 ENCOUNTER — Emergency Department (HOSPITAL_COMMUNITY)
Admission: EM | Admit: 2016-04-01 | Discharge: 2016-04-01 | Disposition: A | Discharge status: Home / Self Care | Payer: Medicare Other | Attending: Emergency Medicine | Admitting: Emergency Medicine

## 2016-04-01 ENCOUNTER — Emergency Department (HOSPITAL_COMMUNITY): Payer: Medicare Other

## 2016-04-01 ENCOUNTER — Encounter (HOSPITAL_COMMUNITY): Payer: Self-pay | Admitting: Emergency Medicine

## 2016-04-01 DIAGNOSIS — I10 Essential (primary) hypertension: Secondary | ICD-10-CM | POA: Diagnosis not present

## 2016-04-01 DIAGNOSIS — K219 Gastro-esophageal reflux disease without esophagitis: Secondary | ICD-10-CM | POA: Diagnosis not present

## 2016-04-01 DIAGNOSIS — G8929 Other chronic pain: Secondary | ICD-10-CM | POA: Insufficient documentation

## 2016-04-01 DIAGNOSIS — Z87891 Personal history of nicotine dependence: Secondary | ICD-10-CM | POA: Insufficient documentation

## 2016-04-01 DIAGNOSIS — Z85828 Personal history of other malignant neoplasm of skin: Secondary | ICD-10-CM | POA: Diagnosis not present

## 2016-04-01 DIAGNOSIS — R42 Dizziness and giddiness: Secondary | ICD-10-CM | POA: Insufficient documentation

## 2016-04-01 DIAGNOSIS — E785 Hyperlipidemia, unspecified: Secondary | ICD-10-CM | POA: Insufficient documentation

## 2016-04-01 DIAGNOSIS — M1990 Unspecified osteoarthritis, unspecified site: Secondary | ICD-10-CM | POA: Insufficient documentation

## 2016-04-01 DIAGNOSIS — R079 Chest pain, unspecified: Secondary | ICD-10-CM | POA: Insufficient documentation

## 2016-04-01 DIAGNOSIS — N400 Enlarged prostate without lower urinary tract symptoms: Secondary | ICD-10-CM | POA: Insufficient documentation

## 2016-04-01 DIAGNOSIS — R0981 Nasal congestion: Secondary | ICD-10-CM | POA: Insufficient documentation

## 2016-04-01 DIAGNOSIS — K5900 Constipation, unspecified: Secondary | ICD-10-CM | POA: Diagnosis not present

## 2016-04-01 DIAGNOSIS — E876 Hypokalemia: Secondary | ICD-10-CM | POA: Diagnosis not present

## 2016-04-01 DIAGNOSIS — Z8619 Personal history of other infectious and parasitic diseases: Secondary | ICD-10-CM | POA: Insufficient documentation

## 2016-04-01 DIAGNOSIS — Z79899 Other long term (current) drug therapy: Secondary | ICD-10-CM | POA: Diagnosis not present

## 2016-04-01 DIAGNOSIS — Z791 Long term (current) use of non-steroidal anti-inflammatories (NSAID): Secondary | ICD-10-CM | POA: Insufficient documentation

## 2016-04-01 DIAGNOSIS — R110 Nausea: Secondary | ICD-10-CM | POA: Insufficient documentation

## 2016-04-01 DIAGNOSIS — G4700 Insomnia, unspecified: Secondary | ICD-10-CM | POA: Insufficient documentation

## 2016-04-01 DIAGNOSIS — Z8709 Personal history of other diseases of the respiratory system: Secondary | ICD-10-CM | POA: Diagnosis not present

## 2016-04-01 DIAGNOSIS — Z7982 Long term (current) use of aspirin: Secondary | ICD-10-CM | POA: Diagnosis not present

## 2016-04-01 DIAGNOSIS — G4733 Obstructive sleep apnea (adult) (pediatric): Secondary | ICD-10-CM | POA: Diagnosis not present

## 2016-04-01 LAB — BASIC METABOLIC PANEL
Anion gap: 15 (ref 5–15)
BUN: 12 mg/dL (ref 6–20)
CHLORIDE: 96 mmol/L — AB (ref 101–111)
CO2: 27 mmol/L (ref 22–32)
CREATININE: 0.85 mg/dL (ref 0.61–1.24)
Calcium: 9.8 mg/dL (ref 8.9–10.3)
Glucose, Bld: 152 mg/dL — ABNORMAL HIGH (ref 65–99)
Potassium: 2.9 mmol/L — ABNORMAL LOW (ref 3.5–5.1)
SODIUM: 138 mmol/L (ref 135–145)

## 2016-04-01 LAB — CBC
HCT: 43.5 % (ref 39.0–52.0)
Hemoglobin: 15 g/dL (ref 13.0–17.0)
MCH: 29.5 pg (ref 26.0–34.0)
MCHC: 34.5 g/dL (ref 30.0–36.0)
MCV: 85.5 fL (ref 78.0–100.0)
PLATELETS: 224 10*3/uL (ref 150–400)
RBC: 5.09 MIL/uL (ref 4.22–5.81)
RDW: 12.8 % (ref 11.5–15.5)
WBC: 8.9 10*3/uL (ref 4.0–10.5)

## 2016-04-01 LAB — URINALYSIS, ROUTINE W REFLEX MICROSCOPIC
BILIRUBIN URINE: NEGATIVE
Glucose, UA: NEGATIVE mg/dL
HGB URINE DIPSTICK: NEGATIVE
KETONES UR: NEGATIVE mg/dL
Leukocytes, UA: NEGATIVE
NITRITE: NEGATIVE
PROTEIN: NEGATIVE mg/dL
SPECIFIC GRAVITY, URINE: 1.014 (ref 1.005–1.030)
pH: 7.5 (ref 5.0–8.0)

## 2016-04-01 LAB — I-STAT TROPONIN, ED: TROPONIN I, POC: 0.01 ng/mL (ref 0.00–0.08)

## 2016-04-01 MED ORDER — POTASSIUM CHLORIDE CRYS ER 20 MEQ PO TBCR
40.0000 meq | EXTENDED_RELEASE_TABLET | Freq: Once | ORAL | Status: AC
  Administered 2016-04-01: 40 meq via ORAL
  Filled 2016-04-01: qty 2

## 2016-04-01 MED ORDER — MECLIZINE HCL 25 MG PO TABS
12.5000 mg | ORAL_TABLET | Freq: Once | ORAL | Status: AC
  Administered 2016-04-01: 12.5 mg via ORAL
  Filled 2016-04-01: qty 1

## 2016-04-01 MED ORDER — MECLIZINE HCL 12.5 MG PO TABS: 12.5000 mg | ORAL_TABLET | Freq: Three times a day (TID) | ORAL | Status: DC | PRN

## 2016-04-01 MED ORDER — SODIUM CHLORIDE 0.9 % IV BOLUS (SEPSIS)
500.0000 mL | Freq: Once | INTRAVENOUS | Status: AC
  Administered 2016-04-01: 500 mL via INTRAVENOUS

## 2016-04-01 MED ORDER — IBUPROFEN 200 MG PO TABS
200.0000 mg | ORAL_TABLET | Freq: Once | ORAL | Status: AC
  Administered 2016-04-01: 200 mg via ORAL
  Filled 2016-04-01: qty 1

## 2016-04-01 MED ORDER — ACETAMINOPHEN 325 MG PO TABS
650.0000 mg | ORAL_TABLET | Freq: Once | ORAL | Status: AC
  Administered 2016-04-01: 650 mg via ORAL
  Filled 2016-04-01: qty 2

## 2016-04-01 MED ORDER — DIAZEPAM 5 MG/ML IJ SOLN
5.0000 mg | Freq: Once | INTRAMUSCULAR | Status: AC
  Administered 2016-04-01: 5 mg via INTRAVENOUS
  Filled 2016-04-01: qty 2

## 2016-04-01 MED ORDER — ONDANSETRON HCL 4 MG/2ML IJ SOLN
4.0000 mg | Freq: Once | INTRAMUSCULAR | Status: AC
  Administered 2016-04-01: 4 mg via INTRAVENOUS
  Filled 2016-04-01: qty 2

## 2016-04-01 NOTE — ED Notes (Signed)
Patient reports that the dizziness is better, he is no longer nauseated, but states he does still have tightness in his R chest/ribs. MD made aware.

## 2016-04-01 NOTE — ED Notes (Signed)
Patient with dizziness since this morning at 0615.  He states that he had some vomiting with the dizziness.  Patient started with chest pain this afternoon.  Patient denies any shortness of breath with the chest pain.  No facial droop, equal hand grips.  Patient is coming from [facility] in [place].

## 2016-04-01 NOTE — ED Provider Notes (Signed)
CSN: [id number]     Arrival date & time 04/01/16  1909 History   First MD Initiated Contact with Patient 04/01/16 1943     Chief Complaint  Patient presents with  . Chest Pain  . Dizziness     (Consider location/radiation/quality/duration/timing/severity/associated sxs/prior Treatment) Patient is a 70 y.o. male presenting with chest pain and dizziness. The history is provided by the patient.  Chest Pain Associated symptoms: dizziness and nausea   Associated symptoms: no abdominal pain, no back pain, no cough, no fever, no headache, no numbness, no shortness of breath and no weakness   Dizziness Associated symptoms: chest pain and nausea   Associated symptoms: no headaches, no hearing loss, no shortness of breath and no weakness   Patient c/o waking up this AM with sense of dizziness, room spinning, worse w head movements, nausea. States symptoms persisted, intermittent dry heaves. States then approximately 3 hours ago onset left lateral chest pain, dull, mild, constant, at rest, not pleuritic. No associated sob or diaphoresis.  Denies any other recent chest pain or discomfort, no exertional cp or discomfort. Patient states recent nasal/sinus congestion and allergy symptoms. No cough. No fever or chills. Denies headache. No ear pain, hearing loss or tinnitus.  No change in speech or vision. No extremity numbness/weakness or loss of normal functional ability.      Past Medical History  Diagnosis Date  . Allergic rhinitis   . Postherpetic neuralgia   . History of chronic sinusitis   . Essential hypertension   . BPH (benign prostatic hypertrophy)   . GERD (gastroesophageal reflux disease)   . OSA on CPAP     uses cpap  . Arthritis     l ankle  . Hyperlipidemia   . Chronic back pain   . Constipation   . Nocturia   . History of skin cancer   . Cancer (HCC)     HX SKIN CANCER  . Insomnia    Past Surgical History  Procedure Laterality Date  . Knee arthroscopy Left 04-19-2011  .  Ankle arthroscopy Left 03/ 2012  . Pilonidal fistulectomy    . Knee arthroscopy with medial menisectomy Right 05/18/2015    Procedure: RIGHT KNEE ARTHROSCOPY WITH  MEDIAL MENISCAL DEBRIDEMENT;  Surgeon: Gaynelle Arabian, MD;  Location: [facility];  Service: Orthopedics;  Laterality: Right;  . Transurethral resection of prostate    . Transurethral resection of prostate N/A 06/25/2015    Procedure: TRANSURETHRAL RESECTION OF THE PROSTATE WITH GYRUS INSTRUMENTS BLADDER BIOPSY AND FULGURATION;  Surgeon: Cleon Gustin, MD;  Location: [facility];  Service: Urology;  Laterality: N/A;   Family History  Problem Relation Age of Onset  . CVA Father   . Seizures Father   . Hypercholesterolemia Mother   . Stroke Mother     diagnosed w/DM,CVD   Social History  Substance Use Topics  . Smoking status: Former Smoker    Quit date: 05/10/1994  . Smokeless tobacco: None  . Alcohol Use: 1.8 oz/week    3 Shots of liquor per week     Comment: 2-3 DRINKS PER WEEK    Review of Systems  Constitutional: Negative for fever.  HENT: Positive for congestion. Negative for hearing loss and sore throat.   Eyes: Negative for visual disturbance.  Respiratory: Negative for cough and shortness of breath.   Cardiovascular: Positive for chest pain. Negative for leg swelling.  Gastrointestinal: Positive for nausea. Negative for abdominal pain.  Genitourinary: Negative for flank pain.  Musculoskeletal:  Negative for back pain and neck pain.  Skin: Negative for rash.  Neurological: Positive for dizziness. Negative for speech difficulty, weakness, numbness and headaches.  Hematological: Does not bruise/bleed easily.  Psychiatric/Behavioral: Negative for confusion.      Allergies  Compazine; Cymbalta; and Promethazine  Home Medications   Prior to Admission medications   Medication Sig Start Date End Date Taking? Authorizing Provider  acetaminophen (TYLENOL) 325 MG tablet Take 650 mg by mouth every 6  (six) hours as needed for moderate pain or headache.     Historical Provider, MD  amLODipine (NORVASC) 10 MG tablet Take 10 mg by mouth every morning.     Historical Provider, MD  aspirin 325 MG tablet Take 325 mg by mouth every morning.     Historical Provider, MD  cholecalciferol (VITAMIN D) 1000 UNITS tablet Take 2,000 Units by mouth every morning.     Historical Provider, MD  Diphenhydramine-PE-APAP (DELSYM COUGH/COLD NIGHT TIME PO) Take 2.5 mLs by mouth at bedtime as needed (cough).     Historical Provider, MD  doxazosin (CARDURA) 8 MG tablet Take 8 mg by mouth at bedtime.    Historical Provider, MD  fluticasone (FLONASE) 50 MCG/ACT nasal spray Place 2 sprays into both nostrils at bedtime as needed for allergies or rhinitis.    Historical Provider, MD  furosemide (LASIX) 20 MG tablet Take 20 mg by mouth every morning.     Historical Provider, MD  hydrochlorothiazide (HYDRODIURIL) 25 MG tablet Take 25 mg by mouth every morning.     Historical Provider, MD  HYDROcodone-acetaminophen (NORCO) 5-325 MG per tablet Take 1-2 tablets by mouth every 6 (six) hours as needed for moderate pain. Patient not taking: Reported on 06/21/2015 05/18/15   Gaynelle Arabian, MD  lisinopril (PRINIVIL,ZESTRIL) 40 MG tablet Take 40 mg by mouth daily.    Historical Provider, MD  meloxicam (MOBIC) 15 MG tablet Take 15 mg by mouth daily with breakfast.    Historical Provider, MD  Multiple Vitamins-Minerals (MULTIVITAMIN WITH MINERALS) tablet Take 1 tablet by mouth every morning.     Historical Provider, MD  omeprazole (PRILOSEC) 20 MG capsule Take 20 mg by mouth every morning.     Historical Provider, MD  oxyCODONE-acetaminophen (PERCOCET/ROXICET) 5-325 MG per tablet Take 1-2 tablets by mouth every 4 (four) hours as needed for moderate pain. 06/25/15   Cleon Gustin, MD  potassium chloride SA (K-DUR,KLOR-CON) 20 MEQ tablet Take 40 mEq by mouth 2 (two) times daily.     Historical Provider, MD  pravastatin (PRAVACHOL) 80 MG  tablet Take 80 mg by mouth every evening.     Historical Provider, MD  sulfamethoxazole-trimethoprim (BACTRIM DS,SEPTRA DS) 800-160 MG per tablet Take 1 tablet by mouth 2 (two) times daily. Patient not taking: Reported on 06/26/2015 06/25/15   Cleon Gustin, MD  tiZANidine (ZANAFLEX) 4 MG capsule Take 4 mg by mouth every 8 (eight) hours as needed for muscle spasms.    Historical Provider, MD  vitamin E 100 UNIT capsule Take by mouth every morning.     Historical Provider, MD  zolpidem (AMBIEN) 10 MG tablet Take 10 mg by mouth at bedtime as needed for sleep.    Historical Provider, MD   There were no vitals taken for this visit. Physical Exam  Constitutional: He is oriented to person, place, and time. He appears well-developed and well-nourished. No distress.  HENT:  Head: Atraumatic.  Mouth/Throat: Oropharynx is clear and moist.  tms normal.   Eyes: Conjunctivae and EOM  are normal. Pupils are equal, round, and reactive to light. No scleral icterus.  Neck: Neck supple. No tracheal deviation present.  No bruits  Cardiovascular: Normal rate, regular rhythm, normal heart sounds and intact distal pulses.   No murmur heard. Pulmonary/Chest: Effort normal and breath sounds normal. No accessory muscle usage. No respiratory distress. He exhibits no tenderness.  Abdominal: Soft. Bowel sounds are normal. He exhibits no distension. There is no tenderness.  Musculoskeletal: Normal range of motion. He exhibits no edema or tenderness.  Neurological: He is alert and oriented to person, place, and time. No cranial nerve deficit.  Speech clear/fluent. Motor intact bil, stre 5/5. No pronator drift. sens grossly intact. Ambulates in ED with steady gait.   Skin: Skin is warm and dry. No rash noted. He is not diaphoretic.  Psychiatric: He has a normal mood and affect.  Nursing note and vitals reviewed.   ED Course  Procedures (including critical care time) Labs Review   Results for orders placed or  performed during the hospital encounter of [id number]  Basic metabolic panel  Result Value Ref Range   Sodium 138 135 - 145 mmol/L   Potassium 2.9 (L) 3.5 - 5.1 mmol/L   Chloride 96 (L) 101 - 111 mmol/L   CO2 27 22 - 32 mmol/L   Glucose, Bld 152 (H) 65 - 99 mg/dL   BUN 12 6 - 20 mg/dL   Creatinine, Ser 0.85 0.61 - 1.24 mg/dL   Calcium 9.8 8.9 - 10.3 mg/dL   GFR calc non Af Amer >60 >60 mL/min   GFR calc Af Amer >60 >60 mL/min   Anion gap 15 5 - 15  CBC  Result Value Ref Range   WBC 8.9 4.0 - 10.5 K/uL   RBC 5.09 4.22 - 5.81 MIL/uL   Hemoglobin 15.0 13.0 - 17.0 g/dL   HCT 43.5 39.0 - 52.0 %   MCV 85.5 78.0 - 100.0 fL   MCH 29.5 26.0 - 34.0 pg   MCHC 34.5 30.0 - 36.0 g/dL   RDW 12.8 11.5 - 15.5 %   Platelets 224 150 - 400 K/uL  I-stat troponin, ED  Result Value Ref Range   Troponin i, poc 0.01 0.00 - 0.08 ng/mL   Comment 3           Dg Chest Port 1 View  04/01/2016  CLINICAL DATA:  Acute chest pain and shortness of breath today. EXAM: PORTABLE CHEST 1 VIEW COMPARISON:  11/16/2014 FINDINGS: The cardiomediastinal silhouette is unremarkable. There is no evidence of focal airspace disease, pulmonary edema, suspicious pulmonary nodule/mass, pleural effusion, or pneumothorax. No acute bony abnormalities are identified. IMPRESSION: No active disease. Electronically Signed   By: Margarette Canada M.D.   On: 04/01/2016 20:32        I have personally reviewed and evaluated these images and lab results as part of my medical decision-making.   EKG Interpretation   Date/Time:  Saturday Apr 01 2016 19:18:52 EDT Ventricular Rate:  87 PR Interval:  162 QRS Duration: 112 QT Interval:  388 QTC Calculation: 466 R Axis:   74 Text Interpretation:  Normal sinus rhythm Non-specific ST-t changes  Confirmed by Ashok Cordia  MD, Lennette Bihari ([id number]) on 04/01/2016 7:31:00 PM      MDM   Iv ns bolus. antivert po. Valium iv. zofran iv.  Reviewed nursing notes and prior charts for additional history.   Monitor,  ecg, labs.  k low, 2.9. kcl po.  Po fluids, pt tolerates well.  Pt  feels much improved. No nv. No dizziness. Ambulates in ED with steady gait, and is requesting d/c to home.  Patient currently appears stable for d/c.   Return precautions provided.       Lajean Saver, MD 04/01/16 2228

## 2016-04-01 NOTE — ED Notes (Signed)
Patient ambulated to restroom with steady gait. Denies dizziness at this time but c/o headache. MD made aware.

## 2016-04-01 NOTE — ED Notes (Signed)
Patient verbalized understanding of discharge instructions and denies any further needs or questions at this time. VS stable. Patient ambulatory with steady gait.  

## 2016-04-01 NOTE — Discharge Instructions (Signed)
It was our pleasure to provide your ER care today - we hope that you feel better.  Rest. Drink plenty of fluids.  From today's lab tests, your potassium level is low (2.9) - take one extra of your potassium tablets each day for the next week, eat plenty of fruits and vegetables, and follow up with your doctor this Monday for recheck.  For dizziness, you may take antivert as need - may make drowsy, no driving when taking, or if/when feeling dizzy.   If sinus/allergy symptoms, you may try claritin of zyrtec as need for symptom relief.   Follow up with your doctor Monday for recheck.  Return to ER if worse, new symptoms, worsening or severe dizziness, persistent vomiting, trouble breathing, persistent or recurrent chest pain, other concern.  You were given medication in the ER - no driving for the next 4 hours.     Dizziness Dizziness is a common problem. It is a feeling of unsteadiness or light-headedness. You may feel like you are about to faint. Dizziness can lead to injury if you stumble or fall. Anyone can become dizzy, but dizziness is more common in older adults. This condition can be caused by a number of things, including medicines, dehydration, or illness. HOME CARE INSTRUCTIONS Taking these steps may help with your condition: Eating and Drinking  Drink enough fluid to keep your urine clear or pale yellow. This helps to keep you from becoming dehydrated. Try to drink more clear fluids, such as water.  Do not drink alcohol.  Limit your caffeine intake if directed by your health care provider.  Limit your salt intake if directed by your health care provider. Activity  Avoid making quick movements.  Rise slowly from chairs and steady yourself until you feel okay.  In the morning, first sit up on the side of the bed. When you feel okay, stand slowly while you hold onto something until you know that your balance is fine.  Move your legs often if you need to stand in one  place for a long time. Tighten and relax your muscles in your legs while you are standing.  Do not drive or operate heavy machinery if you feel dizzy.  Avoid bending down if you feel dizzy. Place items in your home so that they are easy for you to reach without leaning over. Lifestyle  Do not use any tobacco products, including cigarettes, chewing tobacco, or electronic cigarettes. If you need help quitting, ask your health care provider.  Try to reduce your stress level, such as with yoga or meditation. Talk with your health care provider if you need help. General Instructions  Watch your dizziness for any changes.  Take medicines only as directed by your health care provider. Talk with your health care provider if you think that your dizziness is caused by a medicine that you are taking.  Tell a friend or a family member that you are feeling dizzy. If he or she notices any changes in your behavior, have this person call your health care provider.  Keep all follow-up visits as directed by your health care provider. This is important. SEEK MEDICAL CARE IF:  Your dizziness does not go away.  Your dizziness or light-headedness gets worse.  You feel nauseous.  You have reduced hearing.  You have new symptoms.  You are unsteady on your feet or you feel like the room is spinning. SEEK IMMEDIATE MEDICAL CARE IF:  You vomit or have diarrhea and are unable to  eat or drink anything.  You have problems talking, walking, swallowing, or using your arms, hands, or legs.  You feel generally weak.  You are not thinking clearly or you have trouble forming sentences. It may take a friend or family member to notice this.  You have chest pain, abdominal pain, shortness of breath, or sweating.  Your vision changes.  You notice any bleeding.  You have a headache.  You have neck pain or a stiff neck.  You have a fever.   This information is not intended to replace advice given to you  by your health care provider. Make sure you discuss any questions you have with your health care provider.   Document Released: 05/09/2001 Document Revised: 03/30/2015 Document Reviewed: 11/09/2014 Elsevier Interactive Patient Education 2016 [employer].     Hypokalemia Hypokalemia means that the amount of potassium in the blood is lower than normal.Potassium is a chemical, called an electrolyte, that helps regulate the amount of fluid in the body. It also stimulates muscle contraction and helps nerves function properly.Most of the body's potassium is inside of cells, and only a very small amount is in the blood. Because the amount in the blood is so small, minor changes can be life-threatening. CAUSES  Antibiotics.  Diarrhea or vomiting.  Using laxatives too much, which can cause diarrhea.  Chronic kidney disease.  Water pills (diuretics).  Eating disorders (bulimia).  Low magnesium level.  Sweating a lot. SIGNS AND SYMPTOMS  Weakness.  Constipation.  Fatigue.  Muscle cramps.  Mental confusion.  Skipped heartbeats or irregular heartbeat (palpitations).  Tingling or numbness. DIAGNOSIS  Your health care provider can diagnose hypokalemia with blood tests. In addition to checking your potassium level, your health care provider may also check other lab tests. TREATMENT Hypokalemia can be treated with potassium supplements taken by mouth or adjustments in your current medicines. If your potassium level is very low, you may need to get potassium through a vein (IV) and be monitored in the hospital. A diet high in potassium is also helpful. Foods high in potassium are:  Nuts, such as peanuts and pistachios.  Seeds, such as sunflower seeds and pumpkin seeds.  Peas, lentils, and lima beans.  Whole grain and bran cereals and breads.  Fresh fruit and vegetables, such as apricots, avocado, bananas, cantaloupe, kiwi, oranges, tomatoes, asparagus, and potatoes.  Orange  and tomato juices.  Red meats.  Fruit yogurt. HOME CARE INSTRUCTIONS  Take all medicines as prescribed by your health care provider.  Maintain a healthy diet by including nutritious food, such as fruits, vegetables, nuts, whole grains, and lean meats.  If you are taking a laxative, be sure to follow the directions on the label. SEEK MEDICAL CARE IF:  Your weakness gets worse.  You feel your heart pounding or racing.  You are vomiting or having diarrhea.  You are diabetic and having trouble keeping your blood glucose in the normal range. SEEK IMMEDIATE MEDICAL CARE IF:  You have chest pain, shortness of breath, or dizziness.  You are vomiting or having diarrhea for more than 2 days.  You faint. MAKE SURE YOU:   Understand these instructions.  Will watch your condition.  Will get help right away if you are not doing well or get worse.   This information is not intended to replace advice given to you by your health care provider. Make sure you discuss any questions you have with your health care provider.   Document Released: 11/13/2005 Document Revised:  12/04/2014 Document Reviewed: 05/16/2013 Elsevier Interactive Patient Education 2016 [employer]   Nonspecific Chest Pain  Chest pain can be caused by many different conditions. There is always a chance that your pain could be related to something serious, such as a heart attack or a blood clot in your lungs. Chest pain can also be caused by conditions that are not life-threatening. If you have chest pain, it is very important to follow up with your health care provider. CAUSES  Chest pain can be caused by:  Heartburn.  Pneumonia or bronchitis.  Anxiety or stress.  Inflammation around your heart (pericarditis) or lung (pleuritis or pleurisy).  A blood clot in your lung.  A collapsed lung (pneumothorax). It can develop suddenly on its own (spontaneous pneumothorax) or from trauma to the chest.  Shingles  infection (varicella-zoster virus).  Heart attack.  Damage to the bones, muscles, and cartilage that make up your chest wall. This can include:  Bruised bones due to injury.  Strained muscles or cartilage due to frequent or repeated coughing or overwork.  Fracture to one or more ribs.  Sore cartilage due to inflammation (costochondritis). RISK FACTORS  Risk factors for chest pain may include:  Activities that increase your risk for trauma or injury to your chest.  Respiratory infections or conditions that cause frequent coughing.  Medical conditions or overeating that can cause heartburn.  Heart disease or family history of heart disease.  Conditions or health behaviors that increase your risk of developing a blood clot.  Having had chicken pox (varicella zoster). SIGNS AND SYMPTOMS Chest pain can feel like:  Burning or tingling on the surface of your chest or deep in your chest.  Crushing, pressure, aching, or squeezing pain.  Dull or sharp pain that is worse when you move, cough, or take a deep breath.  Pain that is also felt in your back, neck, shoulder, or arm, or pain that spreads to any of these areas. Your chest pain may come and go, or it may stay constant. DIAGNOSIS Lab tests or other studies may be needed to find the cause of your pain. Your health care provider may have you take a test called an ambulatory ECG (electrocardiogram). An ECG records your heartbeat patterns at the time the test is performed. You may also have other tests, such as:  Transthoracic echocardiogram (TTE). During echocardiography, sound waves are used to create a picture of all of the heart structures and to look at how blood flows through your heart.  Transesophageal echocardiogram (TEE).This is a more advanced imaging test that obtains images from inside your body. It allows your health care provider to see your heart in finer detail.  Cardiac monitoring. This allows your health care  provider to monitor your heart rate and rhythm in real time.  Holter monitor. This is a portable device that records your heartbeat and can help to diagnose abnormal heartbeats. It allows your health care provider to track your heart activity for several days, if needed.  Stress tests. These can be done through exercise or by taking medicine that makes your heart beat more quickly.  Blood tests.  Imaging tests. TREATMENT  Your treatment depends on what is causing your chest pain. Treatment may include:  Medicines. These may include:  Acid blockers for heartburn.  Anti-inflammatory medicine.  Pain medicine for inflammatory conditions.  Antibiotic medicine, if an infection is present.  Medicines to dissolve blood clots.  Medicines to treat coronary artery disease.  Supportive care for  conditions that do not require medicines. This may include:  Resting.  Applying heat or cold packs to injured areas.  Limiting activities until pain decreases. HOME CARE INSTRUCTIONS  If you were prescribed an antibiotic medicine, finish it all even if you start to feel better.  Avoid any activities that bring on chest pain.  Do not use any tobacco products, including cigarettes, chewing tobacco, or electronic cigarettes. If you need help quitting, ask your health care provider.  Do not drink alcohol.  Take medicines only as directed by your health care provider.  Keep all follow-up visits as directed by your health care provider. This is important. This includes any further testing if your chest pain does not go away.  If heartburn is the cause for your chest pain, you may be told to keep your head raised (elevated) while sleeping. This reduces the chance that acid will go from your stomach into your esophagus.  Make lifestyle changes as directed by your health care provider. These may include:  Getting regular exercise. Ask your health care provider to suggest some activities that are  safe for you.  Eating a heart-healthy diet. A registered dietitian can help you to learn healthy eating options.  Maintaining a healthy weight.  Managing diabetes, if necessary.  Reducing stress. SEEK MEDICAL CARE IF:  Your chest pain does not go away after treatment.  You have a rash with blisters on your chest.  You have a fever. SEEK IMMEDIATE MEDICAL CARE IF:   Your chest pain is worse.  You have an increasing cough, or you cough up blood.  You have severe abdominal pain.  You have severe weakness.  You faint.  You have chills.  You have sudden, unexplained chest discomfort.  You have sudden, unexplained discomfort in your arms, back, neck, or jaw.  You have shortness of breath at any time.  You suddenly start to sweat, or your skin gets clammy.  You feel nauseous or you vomit.  You suddenly feel light-headed or dizzy.  Your heart begins to beat quickly, or it feels like it is skipping beats. These symptoms may represent a serious problem that is an emergency. Do not wait to see if the symptoms will go away. Get medical help right away. Call your local emergency services (911 in the [place] Do not drive yourself to the hospital.   This information is not intended to replace advice given to you by your health care provider. Make sure you discuss any questions you have with your health care provider.   Document Released: 08/23/2005 Document Revised: 12/04/2014 Document Reviewed: 06/19/2014 Elsevier Interactive Patient Education [employer].

## 2017-01-09 ENCOUNTER — Other Ambulatory Visit: Payer: Self-pay | Admitting: Family Medicine

## 2017-01-10 ENCOUNTER — Other Ambulatory Visit: Payer: Self-pay | Admitting: Family Medicine

## 2017-01-10 DIAGNOSIS — I714 Abdominal aortic aneurysm, without rupture, unspecified: Secondary | ICD-10-CM

## 2017-01-29 ENCOUNTER — Ambulatory Visit
Admission: RE | Admit: 2017-01-29 | Discharge: 2017-01-29 | Disposition: A | Discharge status: Home / Self Care | Payer: Medicare Other | Source: Ambulatory Visit | Attending: Family Medicine | Admitting: Family Medicine

## 2017-01-29 DIAGNOSIS — I714 Abdominal aortic aneurysm, without rupture, unspecified: Secondary | ICD-10-CM

## 2017-01-29 HISTORY — DX: Abdominal aortic aneurysm, without rupture: I71.4

## 2017-01-29 HISTORY — DX: Abdominal aortic aneurysm, without rupture, unspecified: I71.40

## 2017-11-04 ENCOUNTER — Ambulatory Visit: Payer: Self-pay | Admitting: Orthopedic Surgery

## 2017-11-30 ENCOUNTER — Encounter (HOSPITAL_COMMUNITY): Payer: Self-pay

## 2017-11-30 NOTE — Patient Instructions (Addendum)
Your procedure is scheduled on: Wednesday, Jan. 16, 2019   Surgery Time:  3:30PM-5:00PM   Report to [facility] Main  Entrance    Report to admitting at 1:00 PM    Call this number if you have problems the morning of surgery [phone]   Do not eat food:After Midnight.   May have clear liquids until 9:00 am day of surgery   CLEAR LIQUID DIET   Foods Allowed                                                                     Foods Excluded  Coffee and tea, regular and decaf                             liquids that you cannot  Plain Jell-O in any flavor                                             see through such as: Fruit ices (not with fruit pulp)                                     milk, soups, orange juice  Iced Popsicles                                    All solid food Carbonated beverages, regular and diet                                    Cranberry, grape and apple juices Sports drinks like Gatorade Lightly seasoned clear broth or consume(fat free) Sugar, honey syrup  Sample Menu Breakfast                                Lunch                                     Supper Cranberry juice                    Beef broth                            Chicken broth Jell-O                                     Grape juice                           Apple juice Coffee or tea  Jell-O                                      Popsicle                                                Coffee or tea                        Coffee or tea    Do NOT smoke after Midnight   Take these medicines the morning of surgery with A SIP OF WATER: Amlodipine, Omeprazole, Tizanidine                               You may not have any metal on your body including jewelry, and body piercings             Do not wear lotions, powders, perfumes/cologne, or deodorant                          Men may shave face and neck.   Do not bring valuables to the hospital. [place].   Contacts, dentures or bridgework may not be worn into surgery.   Leave suitcase in the car. After surgery it may be brought to your room.   Special Instructions: Bring a copy of your healthcare power of attorney and living will documents         the day of surgery if you haven't scanned them in before.              Please read over the following fact sheets you were given:   [facility] - Preparing for Surgery Before surgery, you can play an important role.  Because skin is not sterile, your skin needs to be as free of germs as possible.  You can reduce the number of germs on your skin by washing with CHG (chlorahexidine gluconate) soap before surgery.  CHG is an antiseptic cleaner which kills germs and bonds with the skin to continue killing germs even after washing. Please DO NOT use if you have an allergy to CHG or antibacterial soaps.  If your skin becomes reddened/irritated stop using the CHG and inform your nurse when you arrive at Short Stay. Do not shave (including legs and underarms) for at least 48 hours prior to the first CHG shower.  You may shave your face/neck.  Please follow these instructions carefully:  1.  Shower with CHG Soap the night before surgery and the  morning of surgery.  2.  If you choose to wash your hair, wash your hair first as usual with your normal  shampoo.  3.  After you shampoo, rinse your hair and body thoroughly to remove the shampoo.                             4.  Use CHG as you would any other liquid soap.  You can apply chg directly to the skin and wash.  Gently with a scrungie  or clean washcloth.  5.  Apply the CHG Soap to your body ONLY FROM THE NECK DOWN.   Do   not use on face/ open                           Wound or open sores. Avoid contact with eyes, ears mouth and   genitals (private parts).                       Wash face,  Genitals (private parts) with your normal soap.             6.  Wash  thoroughly, paying special attention to the area where your    surgery  will be performed.  7.  Thoroughly rinse your body with warm water from the neck down.  8.  DO NOT shower/wash with your normal soap after using and rinsing off the CHG Soap.                9.  Pat yourself dry with a clean towel.            10.  Wear clean pajamas.            11.  Place clean sheets on your bed the night of your first shower and do not  sleep with pets. Day of Surgery : Do not apply any lotions/deodorants the morning of surgery.  Please wear clean clothes to the hospital/surgery center.  FAILURE TO FOLLOW THESE INSTRUCTIONS MAY RESULT IN THE CANCELLATION OF YOUR SURGERY  PATIENT SIGNATURE_________________________________  NURSE SIGNATURE__________________________________  ________________________________________________________________________   Adam Phenix  An incentive spirometer is a tool that can help keep your lungs clear and active. This tool measures how well you are filling your lungs with each breath. Taking long deep breaths may help reverse or decrease the chance of developing breathing (pulmonary) problems (especially infection) following:  A long period of time when you are unable to move or be active. BEFORE THE PROCEDURE   If the spirometer includes an indicator to show your best effort, your nurse or respiratory therapist will set it to a desired goal.  If possible, sit up straight or lean slightly forward. Try not to slouch.  Hold the incentive spirometer in an upright position. INSTRUCTIONS FOR USE  1. Sit on the edge of your bed if possible, or sit up as far as you can in bed or on a chair. 2. Hold the incentive spirometer in an upright position. 3. Breathe out normally. 4. Place the mouthpiece in your mouth and seal your lips tightly around it. 5. Breathe in slowly and as deeply as possible, raising the piston or the ball toward the top of the column. 6. Hold your  breath for 3-5 seconds or for as long as possible. Allow the piston or ball to fall to the bottom of the column. 7. Remove the mouthpiece from your mouth and breathe out normally. 8. Rest for a few seconds and repeat Steps 1 through 7 at least 10 times every 1-2 hours when you are awake. Take your time and take a few normal breaths between deep breaths. 9. The spirometer may include an indicator to show your best effort. Use the indicator as a goal to work toward during each repetition. 10. After each set of 10 deep breaths, practice coughing to be sure your lungs are clear. If you have an incision (the cut made at  the time of surgery), support your incision when coughing by placing a pillow or rolled up towels firmly against it. Once you are able to get out of bed, walk around indoors and cough well. You may stop using the incentive spirometer when instructed by your caregiver.  RISKS AND COMPLICATIONS  Take your time so you do not get dizzy or light-headed.  If you are in pain, you may need to take or ask for pain medication before doing incentive spirometry. It is harder to take a deep breath if you are having pain. AFTER USE  Rest and breathe slowly and easily.  It can be helpful to keep track of a log of your progress. Your caregiver can provide you with a simple table to help with this. If you are using the spirometer at home, follow these instructions: [place] IF:   You are having difficultly using the spirometer.  You have trouble using the spirometer as often as instructed.  Your pain medication is not giving enough relief while using the spirometer.  You develop fever of 100.5 F (38.1 C) or higher. SEEK IMMEDIATE MEDICAL CARE IF:   You cough up bloody sputum that had not been present before.  You develop fever of 102 F (38.9 C) or greater.  You develop worsening pain at or near the incision site. MAKE SURE YOU:   Understand these instructions.  Will watch  your condition.  Will get help right away if you are not doing well or get worse. Document Released: 03/26/2007 Document Revised: 02/05/2012 Document Reviewed: 05/27/2007 ExitCare Patient Information 2014 Exit[employer], [place].   ________________________________________________________________________  WHAT IS A BLOOD TRANSFUSION? Blood Transfusion Information  A transfusion is the replacement of blood or some of its parts. Blood is made up of multiple cells which provide different functions.  Red blood cells carry oxygen and are used for blood loss replacement.  White blood cells fight against infection.  Platelets control bleeding.  Plasma helps clot blood.  Other blood products are available for specialized needs, such as hemophilia or other clotting disorders. BEFORE THE TRANSFUSION  Who gives blood for transfusions?   Healthy volunteers who are fully evaluated to make sure their blood is safe. This is blood bank blood. Transfusion therapy is the safest it has ever been in the practice of medicine. Before blood is taken from a donor, a complete history is taken to make sure that person has no history of diseases nor engages in risky social behavior (examples are intravenous drug use or sexual activity with multiple partners). The donor's travel history is screened to minimize risk of transmitting infections, such as malaria. The donated blood is tested for signs of infectious diseases, such as HIV and hepatitis. The blood is then tested to be sure it is compatible with you in order to minimize the chance of a transfusion reaction. If you or a relative donates blood, this is often done in anticipation of surgery and is not appropriate for emergency situations. It takes many days to process the donated blood. RISKS AND COMPLICATIONS Although transfusion therapy is very safe and saves many lives, the main dangers of transfusion include:   Getting an infectious disease.  Developing a  transfusion reaction. This is an allergic reaction to something in the blood you were given. Every precaution is taken to prevent this. The decision to have a blood transfusion has been considered carefully by your caregiver before blood is given. Blood is not given unless the benefits outweigh the  risks. AFTER THE TRANSFUSION  Right after receiving a blood transfusion, you will usually feel much better and more energetic. This is especially true if your red blood cells have gotten low (anemic). The transfusion raises the level of the red blood cells which carry oxygen, and this usually causes an energy increase.  The nurse administering the transfusion will monitor you carefully for complications. HOME CARE INSTRUCTIONS  No special instructions are needed after a transfusion. You may find your energy is better. Speak with your caregiver about any limitations on activity for underlying diseases you may have. SEEK MEDICAL CARE IF:   Your condition is not improving after your transfusion.  You develop redness or irritation at the intravenous (IV) site. SEEK IMMEDIATE MEDICAL CARE IF:  Any of the following symptoms occur over the next 12 hours:  Shaking chills.  You have a temperature by mouth above 102 F (38.9 C), not controlled by medicine.  Chest, back, or muscle pain.  People around you feel you are not acting correctly or are confused.  Shortness of breath or difficulty breathing.  Dizziness and fainting.  You get a rash or develop hives.  You have a decrease in urine output.  Your urine turns a dark color or changes to pink, red, or brown. Any of the following symptoms occur over the next 10 days:  You have a temperature by mouth above 102 F (38.9 C), not controlled by medicine.  Shortness of breath.  Weakness after normal activity.  The white part of the eye turns yellow (jaundice).  You have a decrease in the amount of urine or are urinating less often.  Your  urine turns a dark color or changes to pink, red, or brown. Document Released: 11/10/2000 Document Revised: 02/05/2012 Document Reviewed: 06/29/2008 [facility] Patient Information 2014 [place], [place].  _______________________________________________________________________

## 2017-12-03 ENCOUNTER — Encounter (HOSPITAL_COMMUNITY): Payer: Self-pay

## 2017-12-03 NOTE — Pre-Procedure Instructions (Signed)
Surgical clearance note from Dr. Alyson Ingles 11/30/17 in chart.

## 2017-12-04 ENCOUNTER — Encounter (HOSPITAL_COMMUNITY): Payer: Self-pay

## 2017-12-04 ENCOUNTER — Other Ambulatory Visit: Payer: Self-pay

## 2017-12-04 ENCOUNTER — Ambulatory Visit: Payer: Self-pay | Admitting: Orthopedic Surgery

## 2017-12-04 ENCOUNTER — Encounter (HOSPITAL_COMMUNITY)
Admission: RE | Admit: 2017-12-04 | Discharge: 2017-12-04 | Disposition: A | Discharge status: Home / Self Care | Payer: Medicare Other | Source: Ambulatory Visit | Attending: Orthopedic Surgery | Admitting: Orthopedic Surgery

## 2017-12-04 DIAGNOSIS — K219 Gastro-esophageal reflux disease without esophagitis: Secondary | ICD-10-CM | POA: Diagnosis not present

## 2017-12-04 DIAGNOSIS — N400 Enlarged prostate without lower urinary tract symptoms: Secondary | ICD-10-CM | POA: Diagnosis not present

## 2017-12-04 DIAGNOSIS — G4733 Obstructive sleep apnea (adult) (pediatric): Secondary | ICD-10-CM | POA: Insufficient documentation

## 2017-12-04 DIAGNOSIS — Z01810 Encounter for preprocedural cardiovascular examination: Secondary | ICD-10-CM | POA: Diagnosis present

## 2017-12-04 DIAGNOSIS — Z7982 Long term (current) use of aspirin: Secondary | ICD-10-CM | POA: Diagnosis not present

## 2017-12-04 DIAGNOSIS — M1612 Unilateral primary osteoarthritis, left hip: Secondary | ICD-10-CM | POA: Diagnosis not present

## 2017-12-04 DIAGNOSIS — I10 Essential (primary) hypertension: Secondary | ICD-10-CM | POA: Diagnosis not present

## 2017-12-04 DIAGNOSIS — Z01818 Encounter for other preprocedural examination: Secondary | ICD-10-CM | POA: Insufficient documentation

## 2017-12-04 DIAGNOSIS — I714 Abdominal aortic aneurysm, without rupture: Secondary | ICD-10-CM | POA: Insufficient documentation

## 2017-12-04 DIAGNOSIS — F329 Major depressive disorder, single episode, unspecified: Secondary | ICD-10-CM | POA: Insufficient documentation

## 2017-12-04 DIAGNOSIS — E785 Hyperlipidemia, unspecified: Secondary | ICD-10-CM | POA: Insufficient documentation

## 2017-12-04 DIAGNOSIS — Z79899 Other long term (current) drug therapy: Secondary | ICD-10-CM | POA: Insufficient documentation

## 2017-12-04 HISTORY — DX: Personal history of other specified conditions: Z87.898

## 2017-12-04 HISTORY — DX: Other chronic pain: G89.29

## 2017-12-04 HISTORY — DX: Depression, unspecified: F32.A

## 2017-12-04 HISTORY — DX: Dyspnea, unspecified: R06.00

## 2017-12-04 HISTORY — DX: Zoster without complications: B02.9

## 2017-12-04 HISTORY — DX: Pain in left hip: M25.552

## 2017-12-04 HISTORY — DX: Abdominal aortic aneurysm, without rupture: I71.4

## 2017-12-04 HISTORY — DX: Hypokalemia: E87.6

## 2017-12-04 HISTORY — DX: Localized edema: R60.0

## 2017-12-04 HISTORY — DX: Unspecified cataract: H26.9

## 2017-12-04 HISTORY — DX: Major depressive disorder, single episode, unspecified: F32.9

## 2017-12-04 HISTORY — DX: Cervicalgia: M54.2

## 2017-12-04 LAB — CBC
HCT: 39.8 % (ref 39.0–52.0)
HEMOGLOBIN: 13.3 g/dL (ref 13.0–17.0)
MCH: 29.8 pg (ref 26.0–34.0)
MCHC: 33.4 g/dL (ref 30.0–36.0)
MCV: 89.2 fL (ref 78.0–100.0)
PLATELETS: 242 10*3/uL (ref 150–400)
RBC: 4.46 MIL/uL (ref 4.22–5.81)
RDW: 13.7 % (ref 11.5–15.5)
WBC: 8.2 10*3/uL (ref 4.0–10.5)

## 2017-12-04 LAB — COMPREHENSIVE METABOLIC PANEL
ALBUMIN: 4.1 g/dL (ref 3.5–5.0)
ALT: 17 U/L (ref 17–63)
AST: 24 U/L (ref 15–41)
Alkaline Phosphatase: 63 U/L (ref 38–126)
Anion gap: 7 (ref 5–15)
BUN: 14 mg/dL (ref 6–20)
CHLORIDE: 100 mmol/L — AB (ref 101–111)
CO2: 27 mmol/L (ref 22–32)
Calcium: 9.6 mg/dL (ref 8.9–10.3)
Creatinine, Ser: 0.8 mg/dL (ref 0.61–1.24)
GFR calc non Af Amer: >60 mL/min (ref >60)
GLUCOSE: 95 mg/dL (ref 65–99)
Potassium: 4.3 mmol/L (ref 3.5–5.1)
SODIUM: 134 mmol/L — AB (ref 135–145)
Total Bilirubin: 0.8 mg/dL (ref 0.3–1.2)
Total Protein: 7.4 g/dL (ref 6.5–8.1)

## 2017-12-04 LAB — PROTIME-INR
INR: 0.96
Prothrombin Time: 12.6 seconds (ref 11.4–15.2)

## 2017-12-04 LAB — ABO/RH: ABO/RH(D): A NEG

## 2017-12-04 LAB — SURGICAL PCR SCREEN
MRSA, PCR: NEGATIVE
Staphylococcus aureus: NEGATIVE

## 2017-12-04 LAB — APTT: APTT: 26 s (ref 24–36)

## 2017-12-04 NOTE — Pre-Procedure Instructions (Signed)
CMP results faxed to Dr. Wynelle Link via epic.

## 2017-12-04 NOTE — H&P (View-Only) (Signed)
Name Mark Zamora, Mark Zamora ([id number], M) DOB [birth year]   Patient's Care Team Primary Care Provider: Estill Bakes MD: [place], [place], [place] [address]   Vitals 12/04/2017 02:42 pm BMI: 35.4 Ht: 5 ft 4 in  Wt: 206 lbs    Allergies Reviewed Allergies CODEINE: Other  CYMBALTA: Other (Moderate)  PROMETHAZINE: Seizure (Severe)    Medications Reviewed Medications amLODIPine 10 mg tablet  doxazosin 8 mg tablet  lisinopril 40 mg tablet  melatonin  meloxicam 15 mg tablet  potassium chloride ER 20 mEq tablet,extended release(part/cryst)  pravastatin 80 mg tablet  spironolactone 25 mg tablet  tiZANidine 4 mg tablet  traMADol 50 mg tablet  zolpidem 10 mg tablet    Family History Reviewed Family History Mother - Mother deceased Father - Father deceased   Social History Reviewed Social History Smoking Status: Former smoker   Surgical History Ankle arthroscopy/surgery - Left Knee arthroscopy/surgery - Right Knee arthroscopy/surgery - Left Transurethral prostatectomy Cataract surgery - Bilateral Surgery   Past Medical History Reviewed Past Medical History Sleep apnea: Y - uses CPAP Notes: History of Shingles   HPI lt total hip  [facility] 12-12-2017 DR Wynelle Link  The patient is a 72 year old male who presents with a hip problem. The patient reports left lateral hip problems including pain symptoms that have been present for month(s). The symptoms began without any known injury. Symptoms reported include weakness (left leg at times) and pain with weightbearing The patient reports symptoms radiating to the: left thigh anteriorly. The patient describes the hip problem as sharp, dull and aching. Onset of symptoms was gradual. The patient feels as if their symptoms are notes that they are unchanged. Current treatment includes non-opioid analgesics (Tylenol Extra Strength). This problem has not been previously evaluated. The patient came for evaluation of the left  hip and thigh pain. It has been ongoing anywhere from several months and gradually has gotten worse. He denies any numbness or tingling going down the leg. Most of his discomfort is in the anterior groin and upper thigh region and goes down to the level above the knee. He tries to remain active and walks about a mile per day but during his walk his leg starts to get weak and tight he will have good days and bad days and have increased pain on different days. He denies any popping or catching or locking with the hip or knee. It does feel like it wants to give way and buckle sometimes with the sensation in his thigh. He denies any swelling with the left leg or thigh. The patient has been given an intra-articular hip injection. Symptoms reported include: pain and aching. and report their pain level to be mild to moderate. The patient has reported some temporary improvement of their symptoms with: Cortisone injections. He feels like the cortisone was beneficial in decreasing the pain in his LEFT hip but it was only short-lived. He has now reached a point where he is ready to proceed with a more permanent option. He is ready to proceed with surgery at this time. Risks and benefits have been discussed and he is ready to proceed with total hip arthroplasty.  ROS Constitutional: Constitutional: no significant weight gain or loss and no fever.  HEENT: Eyes: no irritation, dry eyes, vision change, or sore throat.  Cardiovascular: Cardiovascular: no palpitations or chest pain.  Gastrointestinal: Gastrointestinal: no vomiting or diarrhea and not vomiting blood.  Genitourinary: Genitourinary: no blood in urine or difficulty urinating.  Musculoskeletal: Musculoskeletal:  Joint Pain.   Physical Exam Patient is a 72 year old male.  General Mental Status - Alert, cooperative and good historian. General Appearance - pleasant, Not in acute distress. Orientation - Oriented X3. Build & Nutrition - Well nourished  and Well developed.  Head and Neck Head - normocephalic, atraumatic . Neck Global Assessment - supple, no bruit auscultated on the right, no bruit auscultated on the left.  Eye Pupil - Bilateral - PERR Motion - Bilateral - EOMI.  Chest and Lung Exam Auscultation Breath sounds - clear at anterior chest wall and clear at posterior chest wall. Adventitious sounds - No Adventitious sounds.  Cardiovascular Auscultation Rhythm - Regular rate and rhythm. Heart Sounds - S1 WNL and S2 WNL. Murmurs & Other Heart Sounds - Auscultation of the heart reveals - No Murmurs.  Abdomen Palpation/Percussion Tenderness - Abdomen is non-tender to palpation. Abdomen is soft. Auscultation of the abdomen reveals - Bowel sounds normal. Mild to moderate distention of abdomen.  Male Genitourinary Note: Not done, not pertinent to present illness  Musculoskeletal His LEFT hip can be flexed to 100, rotated and 20, out 30 and abducted 30 with no discomfort. He has a slightly antalgic gait pattern.  Assessment / Plan 1. 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 Maury Dus  IV TXA  Anesthesia Issues: none  Patient was instructed on what medications to stop prior to surgery.  Return to Office Gaynelle Arabian, MD for 5-Post-Op at [facility] on 12/25/2017 at 05:30 PM

## 2017-12-04 NOTE — H&P (Signed)
Name Mark Zamora, Mark Zamora ([id number], M) DOB Aug 17, 1946   Patient's Care Team Primary Care Provider: Estill Bakes MD: [place], [place], [place] [address]   Vitals 12/04/2017 02:42 pm BMI: 35.4 Ht: 5 ft 4 in  Wt: 206 lbs    Allergies Reviewed Allergies CODEINE: Other  CYMBALTA: Other (Moderate)  PROMETHAZINE: Seizure (Severe)    Medications Reviewed Medications amLODIPine 10 mg tablet  doxazosin 8 mg tablet  lisinopril 40 mg tablet  melatonin  meloxicam 15 mg tablet  potassium chloride ER 20 mEq tablet,extended release(part/cryst)  pravastatin 80 mg tablet  spironolactone 25 mg tablet  tiZANidine 4 mg tablet  traMADol 50 mg tablet  zolpidem 10 mg tablet    Family History Reviewed Family History Mother - Mother deceased Father - Father deceased   Social History Reviewed Social History Smoking Status: Former smoker   Surgical History Ankle arthroscopy/surgery - Left Knee arthroscopy/surgery - Right Knee arthroscopy/surgery - Left Transurethral prostatectomy Cataract surgery - Bilateral Surgery   Past Medical History Reviewed Past Medical History Sleep apnea: Y - uses CPAP Notes: History of Shingles   HPI lt total hip  [facility] 12-12-2017 DR Wynelle Link  The patient is a 72 year old male who presents with a hip problem. The patient reports left lateral hip problems including pain symptoms that have been present for month(s). The symptoms began without any known injury. Symptoms reported include weakness (left leg at times) and pain with weightbearing The patient reports symptoms radiating to the: left thigh anteriorly. The patient describes the hip problem as sharp, dull and aching. Onset of symptoms was gradual. The patient feels as if their symptoms are notes that they are unchanged. Current treatment includes non-opioid analgesics (Tylenol Extra Strength). This problem has not been previously evaluated. The patient came for evaluation of the left  hip and thigh pain. It has been ongoing anywhere from several months and gradually has gotten worse. He denies any numbness or tingling going down the leg. Most of his discomfort is in the anterior groin and upper thigh region and goes down to the level above the knee. He tries to remain active and walks about a mile per day but during his walk his leg starts to get weak and tight he will have good days and bad days and have increased pain on different days. He denies any popping or catching or locking with the hip or knee. It does feel like it wants to give way and buckle sometimes with the sensation in his thigh. He denies any swelling with the left leg or thigh. The patient has been given an intra-articular hip injection. Symptoms reported include: pain and aching. and report their pain level to be mild to moderate. The patient has reported some temporary improvement of their symptoms with: Cortisone injections. He feels like the cortisone was beneficial in decreasing the pain in his LEFT hip but it was only short-lived. He has now reached a point where he is ready to proceed with a more permanent option. He is ready to proceed with surgery at this time. Risks and benefits have been discussed and he is ready to proceed with total hip arthroplasty.  ROS Constitutional: Constitutional: no significant weight gain or loss and no fever.  HEENT: Eyes: no irritation, dry eyes, vision change, or sore throat.  Cardiovascular: Cardiovascular: no palpitations or chest pain.  Gastrointestinal: Gastrointestinal: no vomiting or diarrhea and not vomiting blood.  Genitourinary: Genitourinary: no blood in urine or difficulty urinating.  Musculoskeletal: Musculoskeletal:  Joint Pain.   Physical Exam Patient is a 72 year old male.  General Mental Status - Alert, cooperative and good historian. General Appearance - pleasant, Not in acute distress. Orientation - Oriented X3. Build & Nutrition - Well nourished  and Well developed.  Head and Neck Head - normocephalic, atraumatic . Neck Global Assessment - supple, no bruit auscultated on the right, no bruit auscultated on the left.  Eye Pupil - Bilateral - PERR Motion - Bilateral - EOMI.  Chest and Lung Exam Auscultation Breath sounds - clear at anterior chest wall and clear at posterior chest wall. Adventitious sounds - No Adventitious sounds.  Cardiovascular Auscultation Rhythm - Regular rate and rhythm. Heart Sounds - S1 WNL and S2 WNL. Murmurs & Other Heart Sounds - Auscultation of the heart reveals - No Murmurs.  Abdomen Palpation/Percussion Tenderness - Abdomen is non-tender to palpation. Abdomen is soft. Auscultation of the abdomen reveals - Bowel sounds normal. Mild to moderate distention of abdomen.  Male Genitourinary Note: Not done, not pertinent to present illness  Musculoskeletal His LEFT hip can be flexed to 100, rotated and 20, out 30 and abducted 30 with no discomfort. He has a slightly antalgic gait pattern.  Assessment / Plan 1. 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 Maury Dus  IV TXA  Anesthesia Issues: none  Patient was instructed on what medications to stop prior to surgery.  Return to Office Gaynelle Arabian, MD for 5-Post-Op at [facility] on 12/25/2017 at 05:30 PM

## 2017-12-11 MED ORDER — TRANEXAMIC ACID 1000 MG/10ML IV SOLN
1000.0000 mg | INTRAVENOUS | Status: AC
  Administered 2017-12-12: 1000 mg via INTRAVENOUS
  Filled 2017-12-11: qty 1100

## 2017-12-12 ENCOUNTER — Encounter (HOSPITAL_COMMUNITY): Payer: Self-pay | Admitting: Emergency Medicine

## 2017-12-12 ENCOUNTER — Inpatient Hospital Stay (HOSPITAL_COMMUNITY): Payer: Medicare Other

## 2017-12-12 ENCOUNTER — Inpatient Hospital Stay (HOSPITAL_COMMUNITY): Payer: Medicare Other | Admitting: Anesthesiology

## 2017-12-12 ENCOUNTER — Encounter (HOSPITAL_COMMUNITY)
Admission: RE | Disposition: A | Discharge status: Home Health | Payer: Self-pay | Source: Ambulatory Visit | Attending: Orthopedic Surgery

## 2017-12-12 ENCOUNTER — Other Ambulatory Visit: Payer: Self-pay

## 2017-12-12 ENCOUNTER — Inpatient Hospital Stay (HOSPITAL_COMMUNITY)
Admission: RE | Admit: 2017-12-12 | Discharge: 2017-12-13 | DRG: 470 | Disposition: A | Discharge status: Home Health | Payer: Medicare Other | Source: Ambulatory Visit | Attending: Orthopedic Surgery | Admitting: Orthopedic Surgery

## 2017-12-12 DIAGNOSIS — M1612 Unilateral primary osteoarthritis, left hip: Secondary | ICD-10-CM | POA: Diagnosis present

## 2017-12-12 DIAGNOSIS — E785 Hyperlipidemia, unspecified: Secondary | ICD-10-CM | POA: Diagnosis present

## 2017-12-12 DIAGNOSIS — Z9841 Cataract extraction status, right eye: Secondary | ICD-10-CM

## 2017-12-12 DIAGNOSIS — Z9842 Cataract extraction status, left eye: Secondary | ICD-10-CM

## 2017-12-12 DIAGNOSIS — G4733 Obstructive sleep apnea (adult) (pediatric): Secondary | ICD-10-CM | POA: Diagnosis present

## 2017-12-12 DIAGNOSIS — M169 Osteoarthritis of hip, unspecified: Secondary | ICD-10-CM

## 2017-12-12 DIAGNOSIS — Z79899 Other long term (current) drug therapy: Secondary | ICD-10-CM

## 2017-12-12 DIAGNOSIS — Z96649 Presence of unspecified artificial hip joint: Secondary | ICD-10-CM

## 2017-12-12 DIAGNOSIS — Z85828 Personal history of other malignant neoplasm of skin: Secondary | ICD-10-CM | POA: Diagnosis not present

## 2017-12-12 DIAGNOSIS — I10 Essential (primary) hypertension: Secondary | ICD-10-CM | POA: Diagnosis present

## 2017-12-12 DIAGNOSIS — Z87891 Personal history of nicotine dependence: Secondary | ICD-10-CM

## 2017-12-12 DIAGNOSIS — Z8619 Personal history of other infectious and parasitic diseases: Secondary | ICD-10-CM

## 2017-12-12 DIAGNOSIS — K219 Gastro-esophageal reflux disease without esophagitis: Secondary | ICD-10-CM | POA: Diagnosis present

## 2017-12-12 HISTORY — PX: TOTAL HIP ARTHROPLASTY: SHX124

## 2017-12-12 LAB — TYPE AND SCREEN
ABO/RH(D): A NEG
Antibody Screen: NEGATIVE

## 2017-12-12 SURGERY — ARTHROPLASTY, HIP, TOTAL, ANTERIOR APPROACH
Anesthesia: Spinal | Site: Hip | Laterality: Left

## 2017-12-12 MED ORDER — PROPOFOL 10 MG/ML IV BOLUS
INTRAVENOUS | Status: DC | PRN
  Administered 2017-12-12: 20 mg via INTRAVENOUS
  Administered 2017-12-12: 40 mg via INTRAVENOUS
  Administered 2017-12-12: 30 mg via INTRAVENOUS
  Administered 2017-12-12: 20 mg via INTRAVENOUS

## 2017-12-12 MED ORDER — BUPIVACAINE HCL (PF) 0.25 % IJ SOLN
INTRAMUSCULAR | Status: DC | PRN
  Administered 2017-12-12: 30 mL

## 2017-12-12 MED ORDER — SPIRONOLACTONE 25 MG PO TABS
25.0000 mg | ORAL_TABLET | Freq: Every day | ORAL | Status: DC
  Administered 2017-12-13: 25 mg via ORAL
  Filled 2017-12-12: qty 1

## 2017-12-12 MED ORDER — DOXAZOSIN MESYLATE 8 MG PO TABS
8.0000 mg | ORAL_TABLET | Freq: Every day | ORAL | Status: DC
  Administered 2017-12-12: 21:00:00 8 mg via ORAL
  Filled 2017-12-12: qty 1

## 2017-12-12 MED ORDER — MENTHOL 3 MG MT LOZG: 1.0000 | LOZENGE | OROMUCOSAL | Status: DC | PRN

## 2017-12-12 MED ORDER — PROPOFOL 10 MG/ML IV BOLUS
INTRAVENOUS | Status: AC
  Filled 2017-12-12: qty 60

## 2017-12-12 MED ORDER — PROPOFOL 10 MG/ML IV BOLUS
INTRAVENOUS | Status: AC
  Filled 2017-12-12: qty 20

## 2017-12-12 MED ORDER — CEFAZOLIN SODIUM-DEXTROSE 2-4 GM/100ML-% IV SOLN
2.0000 g | INTRAVENOUS | Status: AC
  Administered 2017-12-12: 2 g via INTRAVENOUS
  Filled 2017-12-12: qty 100

## 2017-12-12 MED ORDER — FENTANYL CITRATE (PF) 100 MCG/2ML IJ SOLN
25.0000 ug | INTRAMUSCULAR | Status: DC | PRN
  Administered 2017-12-12: 37.5 ug via INTRAVENOUS
  Administered 2017-12-12 (×2): 25 ug via INTRAVENOUS
  Administered 2017-12-12: 12.5 ug via INTRAVENOUS

## 2017-12-12 MED ORDER — PROPOFOL 500 MG/50ML IV EMUL
INTRAVENOUS | Status: DC | PRN
  Administered 2017-12-12: 50 ug/kg/min via INTRAVENOUS

## 2017-12-12 MED ORDER — MIDAZOLAM HCL 5 MG/5ML IJ SOLN
INTRAMUSCULAR | Status: DC | PRN
  Administered 2017-12-12: 2 mg via INTRAVENOUS

## 2017-12-12 MED ORDER — PHENYLEPHRINE 40 MCG/ML (10ML) SYRINGE FOR IV PUSH (FOR BLOOD PRESSURE SUPPORT)
PREFILLED_SYRINGE | INTRAVENOUS | Status: AC
Start: 2017-12-12 — End: 2017-12-12
  Filled 2017-12-12: qty 10

## 2017-12-12 MED ORDER — PANTOPRAZOLE SODIUM 40 MG PO TBEC
40.0000 mg | DELAYED_RELEASE_TABLET | Freq: Every day | ORAL | Status: DC
  Administered 2017-12-13: 40 mg via ORAL
  Filled 2017-12-12: qty 1

## 2017-12-12 MED ORDER — PRAVASTATIN SODIUM 20 MG PO TABS
80.0000 mg | ORAL_TABLET | Freq: Every evening | ORAL | Status: DC
  Administered 2017-12-12: 21:00:00 80 mg via ORAL
  Filled 2017-12-12: qty 4

## 2017-12-12 MED ORDER — BUPIVACAINE HCL (PF) 0.25 % IJ SOLN
INTRAMUSCULAR | Status: AC
  Filled 2017-12-12: qty 30

## 2017-12-12 MED ORDER — MIDAZOLAM HCL 2 MG/2ML IJ SOLN
INTRAMUSCULAR | Status: AC
  Filled 2017-12-12: qty 2

## 2017-12-12 MED ORDER — ZOLPIDEM TARTRATE 5 MG PO TABS
5.0000 mg | ORAL_TABLET | Freq: Every evening | ORAL | Status: DC | PRN
  Administered 2017-12-12: 23:00:00 5 mg via ORAL
  Filled 2017-12-12: qty 1

## 2017-12-12 MED ORDER — OXYCODONE HCL 5 MG PO TABS
10.0000 mg | ORAL_TABLET | ORAL | Status: DC | PRN
  Administered 2017-12-12 – 2017-12-13 (×5): 10 mg via ORAL
  Filled 2017-12-12 (×5): qty 2

## 2017-12-12 MED ORDER — METOCLOPRAMIDE HCL 5 MG/ML IJ SOLN: 5.0000 mg | Freq: Three times a day (TID) | INTRAMUSCULAR | Status: DC | PRN

## 2017-12-12 MED ORDER — PHENYLEPHRINE HCL 10 MG/ML IJ SOLN
INTRAMUSCULAR | Status: AC
  Filled 2017-12-12: qty 2

## 2017-12-12 MED ORDER — SODIUM CHLORIDE 0.9 % IV SOLN
INTRAVENOUS | Status: DC
  Administered 2017-12-12: 18:00:00 100 mL/h via INTRAVENOUS
  Administered 2017-12-13: 05:00:00 via INTRAVENOUS

## 2017-12-12 MED ORDER — RIVAROXABAN 10 MG PO TABS
10.0000 mg | ORAL_TABLET | Freq: Every day | ORAL | Status: DC
  Administered 2017-12-13: 10 mg via ORAL
  Filled 2017-12-12: qty 1

## 2017-12-12 MED ORDER — CEFAZOLIN SODIUM-DEXTROSE 2-4 GM/100ML-% IV SOLN
2.0000 g | Freq: Four times a day (QID) | INTRAVENOUS | Status: AC
  Administered 2017-12-12 – 2017-12-13 (×2): 2 g via INTRAVENOUS

## 2017-12-12 MED ORDER — ONDANSETRON HCL 4 MG PO TABS: 4.0000 mg | ORAL_TABLET | Freq: Four times a day (QID) | ORAL | Status: DC | PRN

## 2017-12-12 MED ORDER — METOCLOPRAMIDE HCL 5 MG PO TABS: 5.0000 mg | ORAL_TABLET | Freq: Three times a day (TID) | ORAL | Status: DC | PRN

## 2017-12-12 MED ORDER — LACTATED RINGERS IV SOLN
INTRAVENOUS | Status: DC
  Administered 2017-12-12 (×3): via INTRAVENOUS

## 2017-12-12 MED ORDER — EPHEDRINE 5 MG/ML INJ
INTRAVENOUS | Status: AC
  Filled 2017-12-12: qty 10

## 2017-12-12 MED ORDER — FENTANYL CITRATE (PF) 100 MCG/2ML IJ SOLN
INTRAMUSCULAR | Status: DC | PRN
  Administered 2017-12-12 (×2): 50 ug via INTRAVENOUS

## 2017-12-12 MED ORDER — STERILE WATER FOR IRRIGATION IR SOLN
Status: DC | PRN
  Administered 2017-12-12: 2000 mL

## 2017-12-12 MED ORDER — ONDANSETRON HCL 4 MG/2ML IJ SOLN
INTRAMUSCULAR | Status: AC
  Filled 2017-12-12: qty 2

## 2017-12-12 MED ORDER — METHOCARBAMOL 500 MG PO TABS
500.0000 mg | ORAL_TABLET | Freq: Four times a day (QID) | ORAL | Status: DC | PRN
  Administered 2017-12-13 (×2): 500 mg via ORAL
  Filled 2017-12-12 (×2): qty 1

## 2017-12-12 MED ORDER — DIPHENHYDRAMINE HCL 12.5 MG/5ML PO ELIX: 12.5000 mg | ORAL_SOLUTION | ORAL | Status: DC | PRN

## 2017-12-12 MED ORDER — CHLORHEXIDINE GLUCONATE 4 % EX LIQD: 60.0000 mL | Freq: Once | CUTANEOUS | Status: DC

## 2017-12-12 MED ORDER — ONDANSETRON HCL 4 MG/2ML IJ SOLN
INTRAMUSCULAR | Status: DC | PRN
  Administered 2017-12-12: 4 mg via INTRAVENOUS

## 2017-12-12 MED ORDER — 0.9 % SODIUM CHLORIDE (POUR BTL) OPTIME
TOPICAL | Status: DC | PRN
  Administered 2017-12-12: 1000 mL

## 2017-12-12 MED ORDER — ONDANSETRON HCL 4 MG/2ML IJ SOLN
4.0000 mg | Freq: Four times a day (QID) | INTRAMUSCULAR | Status: DC | PRN
  Administered 2017-12-13: 4 mg via INTRAVENOUS
  Filled 2017-12-12: qty 2

## 2017-12-12 MED ORDER — DEXAMETHASONE SODIUM PHOSPHATE 10 MG/ML IJ SOLN
10.0000 mg | Freq: Once | INTRAMUSCULAR | Status: AC
  Administered 2017-12-12: 10 mg via INTRAVENOUS

## 2017-12-12 MED ORDER — AMLODIPINE BESYLATE 10 MG PO TABS: 10.0000 mg | ORAL_TABLET | Freq: Every morning | ORAL | Status: DC

## 2017-12-12 MED ORDER — MEPERIDINE HCL 50 MG/ML IJ SOLN: 6.2500 mg | INTRAMUSCULAR | Status: DC | PRN

## 2017-12-12 MED ORDER — POTASSIUM CHLORIDE CRYS ER 20 MEQ PO TBCR
20.0000 meq | EXTENDED_RELEASE_TABLET | Freq: Every day | ORAL | Status: DC
  Administered 2017-12-13: 09:00:00 20 meq via ORAL
  Filled 2017-12-12: qty 1

## 2017-12-12 MED ORDER — FENTANYL CITRATE (PF) 100 MCG/2ML IJ SOLN
INTRAMUSCULAR | Status: AC
  Filled 2017-12-12: qty 2

## 2017-12-12 MED ORDER — DEXAMETHASONE SODIUM PHOSPHATE 10 MG/ML IJ SOLN
10.0000 mg | Freq: Once | INTRAMUSCULAR | Status: AC
  Administered 2017-12-13: 10 mg via INTRAVENOUS
  Filled 2017-12-12: qty 1

## 2017-12-12 MED ORDER — PHENYLEPHRINE 40 MCG/ML (10ML) SYRINGE FOR IV PUSH (FOR BLOOD PRESSURE SUPPORT)
PREFILLED_SYRINGE | INTRAVENOUS | Status: AC
  Filled 2017-12-12: qty 10

## 2017-12-12 MED ORDER — OXYCODONE HCL 5 MG PO TABS
5.0000 mg | ORAL_TABLET | ORAL | Status: DC | PRN
  Administered 2017-12-12 (×2): 5 mg via ORAL
  Filled 2017-12-12 (×2): qty 1

## 2017-12-12 MED ORDER — MORPHINE SULFATE (PF) 2 MG/ML IV SOLN: 1.0000 mg | INTRAVENOUS | Status: DC | PRN

## 2017-12-12 MED ORDER — FLUTICASONE PROPIONATE 50 MCG/ACT NA SUSP
2.0000 | Freq: Every day | NASAL | Status: DC
  Administered 2017-12-12: 2 via NASAL
  Filled 2017-12-12: qty 16

## 2017-12-12 MED ORDER — FLEET ENEMA 7-19 GM/118ML RE ENEM: 1.0000 | ENEMA | Freq: Once | RECTAL | Status: DC | PRN

## 2017-12-12 MED ORDER — ACETAMINOPHEN 650 MG RE SUPP: 650.0000 mg | RECTAL | Status: DC | PRN

## 2017-12-12 MED ORDER — DOCUSATE SODIUM 100 MG PO CAPS
100.0000 mg | ORAL_CAPSULE | Freq: Two times a day (BID) | ORAL | Status: DC
  Administered 2017-12-12 – 2017-12-13 (×2): 100 mg via ORAL
  Filled 2017-12-12 (×2): qty 1

## 2017-12-12 MED ORDER — POLYETHYLENE GLYCOL 3350 17 G PO PACK: 17.0000 g | PACK | Freq: Every day | ORAL | Status: DC | PRN

## 2017-12-12 MED ORDER — PHENYLEPHRINE HCL 10 MG/ML IJ SOLN
INTRAVENOUS | Status: DC | PRN
  Administered 2017-12-12: 40 ug/min via INTRAVENOUS

## 2017-12-12 MED ORDER — EPHEDRINE SULFATE-NACL 50-0.9 MG/10ML-% IV SOSY
PREFILLED_SYRINGE | INTRAVENOUS | Status: DC | PRN
  Administered 2017-12-12 (×2): 10 mg via INTRAVENOUS

## 2017-12-12 MED ORDER — PHENYLEPHRINE 40 MCG/ML (10ML) SYRINGE FOR IV PUSH (FOR BLOOD PRESSURE SUPPORT)
PREFILLED_SYRINGE | INTRAVENOUS | Status: DC | PRN
  Administered 2017-12-12: 80 ug via INTRAVENOUS
  Administered 2017-12-12: 120 ug via INTRAVENOUS
  Administered 2017-12-12: 40 ug via INTRAVENOUS
  Administered 2017-12-12 (×4): 80 ug via INTRAVENOUS

## 2017-12-12 MED ORDER — ACETAMINOPHEN 500 MG PO TABS
1000.0000 mg | ORAL_TABLET | Freq: Four times a day (QID) | ORAL | Status: DC
  Administered 2017-12-12 – 2017-12-13 (×3): 1000 mg via ORAL
  Filled 2017-12-12 (×3): qty 2

## 2017-12-12 MED ORDER — FENTANYL CITRATE (PF) 100 MCG/2ML IJ SOLN
INTRAMUSCULAR | Status: AC
  Administered 2017-12-12: 37.5 ug via INTRAVENOUS
  Filled 2017-12-12: qty 2

## 2017-12-12 MED ORDER — ACETAMINOPHEN 325 MG PO TABS: 650.0000 mg | ORAL_TABLET | ORAL | Status: DC | PRN

## 2017-12-12 MED ORDER — PHENOL 1.4 % MT LIQD
1.0000 | OROMUCOSAL | Status: DC | PRN
  Filled 2017-12-12: qty 177

## 2017-12-12 MED ORDER — TRANEXAMIC ACID 1000 MG/10ML IV SOLN
1000.0000 mg | Freq: Once | INTRAVENOUS | Status: AC
  Administered 2017-12-12: 1000 mg via INTRAVENOUS
  Filled 2017-12-12: qty 1100

## 2017-12-12 MED ORDER — BISACODYL 10 MG RE SUPP: 10.0000 mg | Freq: Every day | RECTAL | Status: DC | PRN

## 2017-12-12 MED ORDER — METHOCARBAMOL 1000 MG/10ML IJ SOLN
500.0000 mg | Freq: Four times a day (QID) | INTRAVENOUS | Status: DC | PRN
  Administered 2017-12-12 (×2): 500 mg via INTRAVENOUS
  Filled 2017-12-12 (×3): qty 5

## 2017-12-12 MED ORDER — BUPIVACAINE IN DEXTROSE 0.75-8.25 % IT SOLN
INTRATHECAL | Status: DC | PRN
  Administered 2017-12-12: 2 mL via INTRATHECAL

## 2017-12-12 MED ORDER — TRAMADOL HCL 50 MG PO TABS: 50.0000 mg | ORAL_TABLET | Freq: Four times a day (QID) | ORAL | Status: DC | PRN

## 2017-12-12 MED ORDER — ACETAMINOPHEN 10 MG/ML IV SOLN
1000.0000 mg | Freq: Once | INTRAVENOUS | Status: AC
  Administered 2017-12-12: 1000 mg via INTRAVENOUS
  Filled 2017-12-12: qty 100

## 2017-12-12 MED ORDER — DEXAMETHASONE SODIUM PHOSPHATE 10 MG/ML IJ SOLN
INTRAMUSCULAR | Status: AC
  Filled 2017-12-12: qty 1

## 2017-12-12 SURGICAL SUPPLY — 35 items
BAG DECANTER FOR FLEXI CONT (MISCELLANEOUS) IMPLANT
BAG ZIPLOCK 12X15 (MISCELLANEOUS) IMPLANT
BLADE SAG 18X100X1.27 (BLADE) ×3 IMPLANT
CAPT HIP TOTAL 2 ×3 IMPLANT
CLOSURE WOUND 1/2 X4 (GAUZE/BANDAGES/DRESSINGS) ×1
CLOTH BEACON ORANGE TIMEOUT ST (SAFETY) ×3 IMPLANT
COVER PERINEAL POST (MISCELLANEOUS) ×3 IMPLANT
COVER SURGICAL LIGHT HANDLE (MISCELLANEOUS) ×3 IMPLANT
DECANTER SPIKE VIAL GLASS SM (MISCELLANEOUS) ×3 IMPLANT
DRAPE STERI IOBAN 125X83 (DRAPES) ×3 IMPLANT
DRAPE U-SHAPE 47X51 STRL (DRAPES) ×6 IMPLANT
DRSG ADAPTIC 3X8 NADH LF (GAUZE/BANDAGES/DRESSINGS) ×3 IMPLANT
DRSG EMULSION OIL 3X16 NADH (GAUZE/BANDAGES/DRESSINGS) ×3 IMPLANT
DRSG MEPILEX BORDER 4X4 (GAUZE/BANDAGES/DRESSINGS) ×3 IMPLANT
DRSG MEPILEX BORDER 4X8 (GAUZE/BANDAGES/DRESSINGS) ×3 IMPLANT
DURAPREP 26ML APPLICATOR (WOUND CARE) ×3 IMPLANT
ELECT REM PT RETURN 15FT ADLT (MISCELLANEOUS) ×3 IMPLANT
EVACUATOR 1/8 PVC DRAIN (DRAIN) ×3 IMPLANT
GLOVE BIO SURGEON STRL SZ7.5 (GLOVE) ×3 IMPLANT
GLOVE BIO SURGEON STRL SZ8 (GLOVE) ×6 IMPLANT
GLOVE BIOGEL PI IND STRL 8 (GLOVE) ×2 IMPLANT
GLOVE BIOGEL PI INDICATOR 8 (GLOVE) ×4
GOWN STRL REUS W/TWL LRG LVL3 (GOWN DISPOSABLE) ×3 IMPLANT
GOWN STRL REUS W/TWL XL LVL3 (GOWN DISPOSABLE) ×3 IMPLANT
PACK ANTERIOR HIP CUSTOM (KITS) ×3 IMPLANT
STRIP CLOSURE SKIN 1/2X4 (GAUZE/BANDAGES/DRESSINGS) ×2 IMPLANT
SUT ETHIBOND NAB CT1 #1 30IN (SUTURE) ×3 IMPLANT
SUT MNCRL AB 4-0 PS2 18 (SUTURE) ×3 IMPLANT
SUT STRATAFIX 0 PDS 27 VIOLET (SUTURE) ×3
SUT VIC AB 2-0 CT1 27 (SUTURE) ×4
SUT VIC AB 2-0 CT1 TAPERPNT 27 (SUTURE) ×2 IMPLANT
SUTURE STRATFX 0 PDS 27 VIOLET (SUTURE) ×1 IMPLANT
SYR 50ML LL SCALE MARK (SYRINGE) IMPLANT
TRAY FOLEY W/METER SILVER 16FR (SET/KITS/TRAYS/PACK) ×3 IMPLANT
YANKAUER SUCT BULB TIP 10FT TU (MISCELLANEOUS) ×3 IMPLANT

## 2017-12-12 NOTE — Anesthesia Preprocedure Evaluation (Signed)
Anesthesia Evaluation  Patient identified by MRN, date of birth, ID band Patient awake    Reviewed: Allergy & Precautions, NPO status , Patient's Chart, lab work & pertinent test results  History of Anesthesia Complications Negative for: history of anesthetic complications  Airway Mallampati: II  TM Distance: >3 FB Neck ROM: Full    Dental no notable dental hx. (+) Dental Advisory Given   Pulmonary sleep apnea and Continuous Positive Airway Pressure Ventilation , former smoker,    Pulmonary exam normal breath sounds clear to auscultation       Cardiovascular hypertension, Pt. on medications Normal cardiovascular exam Rhythm:Regular Rate:Normal     Neuro/Psych negative neurological ROS  negative psych ROS   GI/Hepatic Neg liver ROS, GERD  Medicated and Controlled,  Endo/Other  negative endocrine ROS  Renal/GU negative Renal ROS  negative genitourinary   Musculoskeletal  (+) Arthritis , Osteoarthritis,    Abdominal   Peds negative pediatric ROS (+)  Hematology negative hematology ROS (+)   Anesthesia Other Findings   Reproductive/Obstetrics negative OB ROS                             Anesthesia Physical  Anesthesia Plan  ASA: III  Anesthesia Plan: Spinal   Post-op Pain Management:    Induction: Intravenous  PONV Risk Score and Plan: 1 and Treatment may vary due to age or medical condition  Airway Management Planned: Natural Airway and Nasal Cannula  Additional Equipment:   Intra-op Plan:   Post-operative Plan: Extubation in OR  Informed Consent: I have reviewed the patients History and Physical, chart, labs and discussed the procedure including the risks, benefits and alternatives for the proposed anesthesia with the patient or authorized representative who has indicated his/her understanding and acceptance.   Dental advisory given  Plan Discussed with: CRNA and  Anesthesiologist  Anesthesia Plan Comments: (  )       Anesthesia Quick Evaluation

## 2017-12-12 NOTE — Progress Notes (Signed)
Pt states that he will self administer CPAP when ready for bed.  RT to monitor and assess as needed.  

## 2017-12-12 NOTE — Transfer of Care (Signed)
Immediate Anesthesia Transfer of Care Note  Patient: Mark Zamora  Procedure(s) Performed: LEFT TOTAL HIP ARTHROPLASTY ANTERIOR APPROACH (Left Hip)  Patient Location: PACU  Anesthesia Type:Spinal  Level of Consciousness: awake, alert  and oriented  Airway & Oxygen Therapy: Patient Spontanous Breathing and Patient connected to face mask oxygen  Post-op Assessment: Report given to RN and Post -op Vital signs reviewed and stable  Post vital signs: Reviewed and stable  Last Vitals:  Vitals:   12/12/17 1259  BP: (!) 153/83  Pulse: 70  Resp: 16  Temp: 36.8 C  SpO2: 98%    Last Pain:  Vitals:   12/12/17 1313  TempSrc:   PainSc: 7       Patients Stated Pain Goal: 4 (67/01/41 0301)  Complications: No apparent anesthesia complications

## 2017-12-12 NOTE — Discharge Instructions (Addendum)
° °Dr. Frank Aluisio °Total Joint Specialist °[facility] °[address] °[place], [place] [address] °[phone] ° °ANTERIOR APPROACH TOTAL HIP REPLACEMENT POSTOPERATIVE DIRECTIONS ° ° °Hip Rehabilitation, Guidelines Following Surgery  °The results of a hip operation are greatly improved after range of motion and muscle strengthening exercises. Follow all safety measures which are given to protect your hip. If any of these exercises cause increased pain or swelling in your joint, decrease the amount until you are comfortable again. Then slowly increase the exercises. Call your caregiver if you have problems or questions.  ° °HOME CARE INSTRUCTIONS  °Remove items at home which could result in a fall. This includes throw rugs or furniture in walking pathways.  °· ICE to the affected hip every three hours for 30 minutes at a time and then as needed for pain and swelling.  Continue to use ice on the hip for pain and swelling from surgery. You may notice swelling that will progress down to the foot and ankle.  This is normal after surgery.  Elevate the leg when you are not up walking on it.   °· Continue to use the breathing machine which will help keep your temperature down.  It is common for your temperature to cycle up and down following surgery, especially at night when you are not up moving around and exerting yourself.  The breathing machine keeps your lungs expanded and your temperature down. ° ° °DIET °You may resume your previous home diet once your are discharged from the hospital. ° °DRESSING / WOUND CARE / SHOWERING °You may shower 3 days after surgery, but keep the wounds dry during showering.  You may use an occlusive plastic wrap (Press'n Seal for example), NO SOAKING/SUBMERGING IN THE BATHTUB.  If the bandage gets wet, change with a clean dry gauze.  If the incision gets wet, pat the wound dry with a clean towel. °You may start showering once you are discharged home but do not  submerge the incision under water. Just pat the incision dry and apply a dry gauze dressing on daily. °Change the surgical dressing daily and reapply a dry dressing each time. ° °ACTIVITY °Walk with your walker as instructed. °Use walker as long as suggested by your caregivers. °Avoid periods of inactivity such as sitting longer than an hour when not asleep. This helps prevent blood clots.  °You may resume a sexual relationship in one month or when given the OK by your doctor.  °You may return to work once you are cleared by your doctor.  °Do not drive a car for 6 weeks or until released by you surgeon.  °Do not drive while taking narcotics. ° °WEIGHT BEARING °Weight bearing as tolerated with assist device (walker, cane, etc) as directed, use it as long as suggested by your surgeon or therapist, typically at least 4-6 weeks. ° °POSTOPERATIVE CONSTIPATION PROTOCOL °Constipation - defined medically as fewer than three stools per week and severe constipation as less than one stool per week. ° °One of the most common issues patients have following surgery is constipation.  Even if you have a regular bowel pattern at home, your normal regimen is likely to be disrupted due to multiple reasons following surgery.  Combination of anesthesia, postoperative narcotics, change in appetite and fluid intake all can affect your bowels.  In order to avoid complications following surgery, here are some recommendations in order to help you during your recovery period. ° °Colace (docusate) - Pick up an over-the-counter   form of Colace or another stool softener and take twice a day as long as you are requiring postoperative pain medications.  Take with a full glass of water daily.  If you experience loose stools or diarrhea, hold the colace until you stool forms back up.  If your symptoms do not get better within 1 week or if they get worse, check with your doctor. ° °Dulcolax (bisacodyl) - Pick up over-the-counter and take as directed  by the product packaging as needed to assist with the movement of your bowels.  Take with a full glass of water.  Use this product as needed if not relieved by Colace only.  ° °MiraLax (polyethylene glycol) - Pick up over-the-counter to have on hand.  MiraLax is a solution that will increase the amount of water in your bowels to assist with bowel movements.  Take as directed and can mix with a glass of water, juice, soda, coffee, or tea.  Take if you go more than two days without a movement. °Do not use MiraLax more than once per day. Call your doctor if you are still constipated or irregular after using this medication for 7 days in a row. ° °If you continue to have problems with postoperative constipation, please contact the office for further assistance and recommendations.  If you experience "the worst abdominal pain ever" or develop nausea or vomiting, please contact the office immediatly for further recommendations for treatment. ° °ITCHING ° If you experience itching with your medications, try taking only a single pain pill, or even half a pain pill at a time.  You can also use Benadryl over the counter for itching or also to help with sleep.  ° °TED HOSE STOCKINGS °Wear the elastic stockings on both legs for three weeks following surgery during the day but you may remove then at night for sleeping. ° °MEDICATIONS °See your medication summary on the “After Visit Summary” that the nursing staff will review with you prior to discharge.  You may have some home medications which will be placed on hold until you complete the course of blood thinner medication.  It is important for you to complete the blood thinner medication as prescribed by your surgeon.  Continue your approved medications as instructed at time of discharge. ° °PRECAUTIONS °If you experience chest pain or shortness of breath - call 911 immediately for transfer to the hospital emergency department.  °If you develop a fever greater that 101 F,  purulent drainage from wound, increased redness or drainage from wound, foul odor from the wound/dressing, or calf pain - CONTACT YOUR SURGEON.   °                                                °FOLLOW-UP APPOINTMENTS °Make sure you keep all of your appointments after your operation with your surgeon and caregivers. You should call the office at the above phone number and make an appointment for approximately two weeks after the date of your surgery or on the date instructed by your surgeon outlined in the "After Visit Summary". ° °RANGE OF MOTION AND STRENGTHENING EXERCISES  °These exercises are designed to help you keep full movement of your hip joint. Follow your caregiver's or physical therapist's instructions. Perform all exercises about fifteen times, three times per day or as directed. Exercise both hips, even if you   have had only one joint replacement. These exercises can be done on a training (exercise) mat, on the floor, on a table or on a bed. Use whatever works the best and is most comfortable for you. Use music or television while you are exercising so that the exercises are a pleasant break in your day. This will make your life better with the exercises acting as a break in routine you can look forward to.  Lying on your back, slowly slide your foot toward your buttocks, raising your knee up off the floor. Then slowly slide your foot back down until your leg is straight again.  Lying on your back spread your legs as far apart as you can without causing discomfort.  Lying on your side, raise your upper leg and foot straight up from the floor as far as is comfortable. Slowly lower the leg and repeat.  Lying on your back, tighten up the muscle in the front of your thigh (quadriceps muscles). You can do this by keeping your leg straight and trying to raise your heel off the floor. This helps strengthen the largest muscle supporting your knee.  Lying on your back, tighten up the muscles of your  buttocks both with the legs straight and with the knee bent at a comfortable angle while keeping your heel on the floor.   IF YOU ARE TRANSFERRED TO A SKILLED REHAB FACILITY If the patient is transferred to a skilled rehab facility following release from the hospital, a list of the current medications will be sent to the facility for the patient to continue.  When discharged from the skilled rehab facility, please have the facility set up the patient's Banner prior to being released. Also, the skilled facility will be responsible for providing the patient with their medications at time of release from the facility to include their pain medication, the muscle relaxants, and their blood thinner medication. If the patient is still at the rehab facility at time of the two week follow up appointment, the skilled rehab facility will also need to assist the patient in arranging follow up appointment in our office and any transportation needs.  MAKE SURE YOU:  Understand these instructions.  Get help right away if you are not doing well or get worse.    Pick up stool softner and laxative for home use following surgery while on pain medications. Do not submerge incision under water. Please use good hand washing techniques while changing dressing each day. May shower starting three days after surgery. Please use a clean towel to pat the incision dry following showers. Continue to use ice for pain and swelling after surgery. Do not use any lotions or creams on the incision until instructed by your surgeon.  Take Xarelto for two and a half more weeks following discharge from the hospital, then discontinue Xarelto. Once the patient has completed the Xarelto, they may resume the 325 mg Aspirin.    Information on my medicine - XARELTO (Rivaroxaban)   Why was Xarelto prescribed for you? Xarelto was prescribed for you to reduce the risk of blood clots forming after orthopedic  surgery. The medical term for these abnormal blood clots is venous thromboembolism (VTE).  What do you need to know about xarelto ? Take your Xarelto ONCE DAILY at the same time every day. You may take it either with or without food.  If you have difficulty swallowing the tablet whole, you may crush it and mix in applesauce just  prior to taking your dose.  Take Xarelto exactly as prescribed by your doctor and DO NOT stop taking Xarelto without talking to the doctor who prescribed the medication.  Stopping without other VTE prevention medication to take the place of Xarelto may increase your risk of developing a clot.  After discharge, you should have regular check-up appointments with your healthcare provider that is prescribing your Xarelto.    What do you do if you miss a dose? If you miss a dose, take it as soon as you remember on the same day then continue your regularly scheduled once daily regimen the next day. Do not take two doses of Xarelto on the same day.   Important Safety Information A possible side effect of Xarelto is bleeding. You should call your healthcare provider right away if you experience any of the following: ? Bleeding from an injury or your nose that does not stop. ? Unusual colored urine (red or dark brown) or unusual colored stools (red or black). ? Unusual bruising for unknown reasons. ? A serious fall or if you hit your head (even if there is no bleeding).  Some medicines may interact with Xarelto and might increase your risk of bleeding while on Xarelto. To help avoid this, consult your healthcare provider or pharmacist prior to using any new prescription or non-prescription medications, including herbals, vitamins, non-steroidal anti-inflammatory drugs (NSAIDs) and supplements.  This website has more information on Xarelto: [email or web address]

## 2017-12-12 NOTE — Anesthesia Procedure Notes (Signed)
Spinal  Start time: 12/12/2017 2:10 PM End time: 12/12/2017 2:15 PM Staffing Anesthesiologist: Janeece Riggers, MD Resident/CRNA: Montel Clock, CRNA Performed: resident/CRNA  Preanesthetic Checklist Completed: patient identified, surgical consent, pre-op evaluation, timeout performed, IV checked, risks and benefits discussed and monitors and equipment checked Spinal Block Patient position: sitting Prep: DuraPrep Patient monitoring: heart rate, cardiac monitor, continuous pulse ox and blood pressure Approach: midline Location: L3-4 Injection technique: single-shot Needle Needle type: Pencan  Needle gauge: 24 G Needle length: 10 cm Needle insertion depth: 8 cm

## 2017-12-12 NOTE — Op Note (Signed)
OPERATIVE REPORT- TOTAL HIP ARTHROPLASTY   PREOPERATIVE DIAGNOSIS: Osteoarthritis of the Left hip.   POSTOPERATIVE DIAGNOSIS: Osteoarthritis of the Left  hip.   PROCEDURE: Left total hip arthroplasty, anterior approach.   SURGEON: Gaynelle Arabian, MD   ASSISTANT: Arlee Muslim, PA-C  ANESTHESIA:  Spinal  ESTIMATED BLOOD LOSS:-500 mL    DRAINS: Hemovac x1.   COMPLICATIONS: None   CONDITION: PACU - hemodynamically stable.   BRIEF CLINICAL NOTE: Mark Zamora is a 72 y.o. male who has advancedarthritis of their Left  hip with progressively worsening pain and  dysfunction.The patient has failed nonoperative management and presents for  total hip arthroplasty.   PROCEDURE IN DETAIL: After successful administration of spinal  anesthetic, the traction boots for the [facility] bed were placed on both  feet and the patient was placed onto the [facility] bed, boots placed into the leg  holders. The Left hip was then isolated from the perineum with plastic  drapes and prepped and draped in the usual sterile fashion. ASIS and  greater trochanter were marked and a oblique incision was made, starting  at about 1 cm lateral and 2 cm distal to the ASIS and coursing towards  the anterior cortex of the femur. The skin was cut with a 10 blade  through subcutaneous tissue to the level of the fascia overlying the  tensor fascia lata muscle. The fascia was then incised in line with the  incision at the junction of the anterior third and posterior 2/3rd. The  muscle was teased off the fascia and then the interval between the TFL  and the rectus was developed. The Hohmann retractor was then placed at  the top of the femoral neck over the capsule. The vessels overlying the  capsule were cauterized and the fat on top of the capsule was removed.  A Hohmann retractor was then placed anterior underneath the rectus  femoris to give exposure to the entire anterior capsule. A T-shaped  capsulotomy was  performed. The edges were tagged and the femoral head  was identified.       Osteophytes are removed off the superior acetabulum.  The femoral neck was then cut in situ with an oscillating saw. Traction  was then applied to the left lower extremity utilizing the [facility]  traction. The femoral head was then removed. Retractors were placed  around the acetabulum and then circumferential removal of the labrum was  performed. Osteophytes were also removed. Reaming starts at 47 mm to  medialize and  Increased in 2 mm increments to 51 mm. We reamed in  approximately 40 degrees of abduction, 20 degrees anteversion. A 52 mm  pinnacle acetabular shell was then impacted in anatomic position under  fluoroscopic guidance with excellent purchase. We did not need to place  any additional dome screws. A 32 mm neutral + 4 marathon liner was then  placed into the acetabular shell.       The femoral lift was then placed along the lateral aspect of the femur  just distal to the vastus ridge. The leg was  externally rotated and capsule  was stripped off the inferior aspect of the femoral neck down to the  level of the lesser trochanter, this was done with electrocautery. The femur was lifted after this was performed. The  leg was then placed in an extended and adducted position essentially delivering the femur. We also removed the capsule superiorly and the piriformis from the piriformis fossa to gain excellent  exposure of the  proximal femur. Rongeur was used to remove some cancellous bone to get  into the lateral portion of the proximal femur for placement of the  initial starter reamer. The starter broaches was placed  the starter broach  and was shown to go down the center of the canal. Broaching  with the  Corail system was then performed starting at size 8, coursing  Up to size 12. A size 12 had excellent torsional and rotational  and axial stability. The trial standard offset neck was then placed  with a 32  + 1 trial head. The hip was then reduced. We confirmed that  the stem was in the canal both on AP and lateral x-rays. It also has excellent sizing. The hip was reduced with outstanding stability through full extension and full external rotation.. AP pelvis was taken and the leg lengths were measured and found to be equal. Hip was then dislocated again and the femoral head and neck removed. The  femoral broach was removed. Size 12 Corail stem with a standard offset  neck was then impacted into the femur following native anteversion. Has  excellent purchase in the canal. Excellent torsional and rotational and  axial stability. It is confirmed to be in the canal on AP and lateral  fluoroscopic views. The 32 + 1 ceramic head was placed and the hip  reduced with outstanding stability. Again AP pelvis was taken and it  confirmed that the leg lengths were equal. The wound was then copiously  irrigated with saline solution and the capsule reattached and repaired  with Ethibond suture. 30 ml of .25% Bupivicaine was  injected into the capsule and into the edge of the tensor fascia lata as well as subcutaneous tissue. The fascia overlying the tensor fascia lata was then closed with a running #1 V-Loc. Subcu was closed with interrupted 2-0 Vicryl and subcuticular running 4-0 Monocryl. Incision was cleaned  and dried. Steri-Strips and a bulky sterile dressing applied. Hemovac  drain was hooked to suction and then the patient was awakened and transported to  recovery in stable condition.        Please note that a surgical assistant was a medical necessity for this procedure to perform it in a safe and expeditious manner. Assistant was necessary to provide appropriate retraction of vital neurovascular structures and to prevent femoral fracture and allow for anatomic placement of the prosthesis.  Gaynelle Arabian, M.D.

## 2017-12-12 NOTE — Anesthesia Postprocedure Evaluation (Signed)
Anesthesia Post Note  Patient: Mark Zamora  Procedure(s) Performed: LEFT TOTAL HIP ARTHROPLASTY ANTERIOR APPROACH (Left Hip)     Patient location during evaluation: PACU Anesthesia Type: Spinal Level of consciousness: awake and alert Pain management: pain level controlled Vital Signs Assessment: post-procedure vital signs reviewed and stable Respiratory status: spontaneous breathing and respiratory function stable Cardiovascular status: blood pressure returned to baseline and stable Postop Assessment: spinal receding Anesthetic complications: no    Last Vitals:  Vitals:   12/12/17 1700 12/12/17 1715  BP: 127/89 127/73  Pulse: 78 83  Resp: 18 16  Temp: 36.6 C 36.7 C  SpO2: 98%     Last Pain:  Vitals:   12/12/17 1715  TempSrc: Oral  PainSc: 3                  Mayli Covington,W. EDMOND

## 2017-12-12 NOTE — Interval H&P Note (Signed)
History and Physical Interval Note:  12/12/2017 12:54 PM  Mark Zamora  has presented today for surgery, with the diagnosis of Left hip osteoarthritis  The various methods of treatment have been discussed with the patient and family. After consideration of risks, benefits and other options for treatment, the patient has consented to  Procedure(s): LEFT TOTAL HIP ARTHROPLASTY ANTERIOR APPROACH (Left) as a surgical intervention .  The patient's history has been reviewed, patient examined, no change in status, stable for surgery.  I have reviewed the patient's chart and labs.  Questions were answered to the patient's satisfaction.     Pilar Plate Lendon George

## 2017-12-13 ENCOUNTER — Encounter (HOSPITAL_COMMUNITY): Payer: Self-pay | Admitting: Orthopedic Surgery

## 2017-12-13 LAB — BASIC METABOLIC PANEL
Anion gap: 7 (ref 5–15)
BUN: 14 mg/dL (ref 6–20)
CALCIUM: 8.8 mg/dL — AB (ref 8.9–10.3)
CO2: 24 mmol/L (ref 22–32)
CREATININE: 0.78 mg/dL (ref 0.61–1.24)
Chloride: 101 mmol/L (ref 101–111)
GFR calc Af Amer: >60 mL/min (ref >60)
GFR calc non Af Amer: >60 mL/min (ref >60)
GLUCOSE: 118 mg/dL — AB (ref 65–99)
Potassium: 4.1 mmol/L (ref 3.5–5.1)
Sodium: 132 mmol/L — ABNORMAL LOW (ref 135–145)

## 2017-12-13 LAB — CBC
HEMATOCRIT: 32.8 % — AB (ref 39.0–52.0)
Hemoglobin: 11.1 g/dL — ABNORMAL LOW (ref 13.0–17.0)
MCH: 29.9 pg (ref 26.0–34.0)
MCHC: 33.8 g/dL (ref 30.0–36.0)
MCV: 88.4 fL (ref 78.0–100.0)
Platelets: 224 10*3/uL (ref 150–400)
RBC: 3.71 MIL/uL — ABNORMAL LOW (ref 4.22–5.81)
RDW: 13.4 % (ref 11.5–15.5)
WBC: 12.5 10*3/uL — ABNORMAL HIGH (ref 4.0–10.5)

## 2017-12-13 MED ORDER — OXYCODONE HCL 5 MG PO TABS: 5.0000 mg | ORAL_TABLET | ORAL | 0 refills | Status: DC | PRN

## 2017-12-13 MED ORDER — RIVAROXABAN 10 MG PO TABS: 10.0000 mg | ORAL_TABLET | Freq: Every day | ORAL | 0 refills | Status: DC

## 2017-12-13 MED ORDER — ALUM & MAG HYDROXIDE-SIMETH 200-200-20 MG/5ML PO SUSP
30.0000 mL | Freq: Four times a day (QID) | ORAL | Status: DC | PRN
  Filled 2017-12-13: qty 30

## 2017-12-13 MED ORDER — TRAMADOL HCL 50 MG PO TABS: 50.0000 mg | ORAL_TABLET | Freq: Four times a day (QID) | ORAL | 0 refills | Status: DC | PRN

## 2017-12-13 MED ORDER — TIZANIDINE HCL 4 MG PO CAPS: 4.0000 mg | ORAL_CAPSULE | Freq: Three times a day (TID) | ORAL | 0 refills | Status: DC

## 2017-12-13 NOTE — Evaluation (Signed)
Physical Therapy Evaluation Patient Details Name: Mark Zamora MRN: [id number] DOB: October 04, 1946 Today's Date: 12/13/2017   History of Present Illness  Pt s/p L THR and with hx of MI and CAD  Clinical Impression  Pt s/p L THR and presents with decreased L LE strength/ROM and post op pain limiting functional mobility.  Pt should progress to dc home with family assist.    Follow Up Recommendations Home health PT;DC plan and follow up therapy as arranged by surgeon    Equipment Recommendations  3in1 (PT)    Recommendations for Other Services       Precautions / Restrictions Precautions Precautions: Fall Restrictions Weight Bearing Restrictions: No Other Position/Activity Restrictions: WBAT      Mobility  Bed Mobility Overal bed mobility: Needs Assistance Bed Mobility: Supine to Sit     Supine to sit: Min assist     General bed mobility comments: cues for sequence and use of R LE to self assist.  Physical assist to bring trunk to upright sitting  Transfers Overall transfer level: Needs assistance Equipment used: Rolling walker (2 wheeled) Transfers: Sit to/from Stand Sit to Stand: Min assist;Min guard         General transfer comment: cues for LE management and use of UEs to self assist  Ambulation/Gait Ambulation/Gait assistance: Min assist;Min guard Ambulation Distance (Feet): 68 Feet Assistive device: Rolling walker (2 wheeled) Gait Pattern/deviations: Step-to pattern;Step-through pattern;Decreased step length - right;Decreased step length - left;Shuffle;Trunk flexed Gait velocity: decr Gait velocity interpretation: Below normal speed for age/gender General Gait Details: cues for posture, position from RW and initial sequence  Stairs            Wheelchair Mobility    Modified Rankin (Stroke Patients Only)       Balance                                             Pertinent Vitals/Pain Pain Assessment: 0-10 Pain Score:  5  Pain Location: L hip/thigh Pain Descriptors / Indicators: Aching;Burning;Sore Pain Intervention(s): Limited activity within patient's tolerance;Monitored during session;Premedicated before session;Ice applied    Home Living Family/patient expects to be discharged to:: Private residence Living Arrangements: Spouse/significant other Available Help at Discharge: Family Type of Home: House Home Access: Stairs to enter   Technical brewer of Steps: 1 Home Layout: One level Home Equipment: [place] - 2 wheels Additional Comments: Pt is home with spouse who has dementia and recently broke her non-dominant UE.  Pt states son and dtr will assist    Prior Function Level of Independence: Independent               Hand Dominance        Extremity/Trunk Assessment   Upper Extremity Assessment Upper Extremity Assessment: Overall WFL for tasks assessed    Lower Extremity Assessment Lower Extremity Assessment: LLE deficits/detail LLE Deficits / Details: 2/5 strength at hip with AAROM at hip to 80 flex and 15 abd    Cervical / Trunk Assessment Cervical / Trunk Assessment: Normal  Communication   Communication: No difficulties  Cognition Arousal/Alertness: Awake/alert Behavior During Therapy: WFL for tasks assessed/performed Overall Cognitive Status: Within Functional Limits for tasks assessed  General Comments      Exercises Total Joint Exercises Ankle Circles/Pumps: AROM;Both;15 reps;Supine Quad Sets: AROM;Both;10 reps;Supine Heel Slides: AAROM;Left;20 reps;Supine Hip ABduction/ADduction: AAROM;Right;15 reps;Supine   Assessment/Plan    PT Assessment Patient needs continued PT services  PT Problem List Decreased strength;Decreased range of motion;Decreased activity tolerance;Decreased mobility;Decreased knowledge of use of DME;Pain;Obesity       PT Treatment Interventions DME instruction;Gait training;Stair  training;Functional mobility training;Therapeutic activities;Therapeutic exercise;Patient/family education    PT Goals (Current goals can be found in the Care Plan section)  Acute Rehab PT Goals Patient Stated Goal: Regain IND to care for spouse PT Goal Formulation: With patient Time For Goal Achievement: 12/15/17 Potential to Achieve Goals: Good    Frequency 7X/week   Barriers to discharge        Co-evaluation               AM-PAC PT "6 Clicks" Daily Activity  Outcome Measure Difficulty turning over in bed (including adjusting bedclothes, sheets and blankets)?: A Lot Difficulty moving from lying on back to sitting on the side of the bed? : Unable Difficulty sitting down on and standing up from a chair with arms (e.g., wheelchair, bedside commode, etc,.)?: A Lot Help needed moving to and from a bed to chair (including a wheelchair)?: A Little Help needed walking in hospital room?: A Little Help needed climbing 3-5 steps with a railing? : A Little 6 Click Score: 14    End of Session Equipment Utilized During Treatment: Gait belt Activity Tolerance: Patient tolerated treatment well Patient left: in chair;with call bell/phone within reach;with chair alarm set Nurse Communication: Mobility status PT Visit Diagnosis: Difficulty in walking, not elsewhere classified (R26.2)    Time: 0569-7948 PT Time Calculation (min) (ACUTE ONLY): 43 min   Charges:   PT Evaluation $PT Eval Low Complexity: 1 Low PT Treatments $Gait Training: 8-22 mins $Therapeutic Exercise: 8-22 mins   PT G Codes:        Pg [phone]   Eriel Doyon 12/13/2017, 3:40 PM

## 2017-12-13 NOTE — Progress Notes (Signed)
Subjective: 1 Day Post-Op Procedure(s) (LRB): LEFT TOTAL HIP ARTHROPLASTY ANTERIOR APPROACH (Left) Patient reports pain as mild.   Patient seen in rounds by Dr. Wynelle Link. Patient is well, but has had some minor complaints of pain in the hip, requiring pain medications We will start therapy today.  If they do well with therapy and meets all goals, then will allow home later this afternoon following therapy. Plan is to go Home after hospital stay.  Objective: Vital signs in last 24 hours: Temp:  [96.1 F (35.6 C)-98.7 F (37.1 C)] 98.4 F (36.9 C) (01/17 0553) Pulse Rate:  [63-83] 63 (01/17 0553) Resp:  [11-18] 18 (01/17 0553) BP: (105-153)/(55-91) 105/62 (01/17 0553) SpO2:  [98 %-100 %] 98 % (01/17 0553) Weight:  [93.4 kg (206 lb)] 93.4 kg (206 lb) (01/16 1715)  Intake/Output from previous day:  Intake/Output Summary (Last 24 hours) at 12/13/2017 0712 Last data filed at 12/13/2017 0600 Gross per 24 hour  Intake 4545 ml  Output 3835 ml  Net 710 ml    Intake/Output this shift: No intake/output data recorded.  Labs: Recent Labs    12/13/17 0546  HGB 11.1*   Recent Labs    12/13/17 0546  WBC 12.5*  RBC 3.71*  HCT 32.8*  PLT 224   Recent Labs    12/13/17 0546  NA 132*  K 4.1  CL 101  CO2 24  BUN 14  CREATININE 0.78  GLUCOSE 118*  CALCIUM 8.8*   No results for input(s): LABPT, INR in the last 72 hours.  EXAM General - Patient is Alert, Appropriate and Oriented Extremity - Neurovascular intact Sensation intact distally Intact pulses distally Dorsiflexion/Plantar flexion intact Dressing - dressing C/D/I Motor Function - intact, moving foot and toes well on exam.  Hemovac pulled without difficulty.  Past Medical History:  Diagnosis Date  . AAA (abdominal aortic aneurysm) ([facility]) 01/29/2017   2.9cm  . Allergic rhinitis   . Arthritis    l ankle  . BPH (benign prostatic hypertrophy)   . Cancer ([facility])    HX SKIN CANCER  . Cataract, bilateral   .  Chronic back pain   . Chronic left hip pain   . Constipation   . Depression   . Dyspnea    with exertion  . Essential hypertension   . GERD (gastroesophageal reflux disease)   . History of chronic sinusitis   . History of dizziness 03/2016  . History of skin cancer   . Hyperlipidemia   . Hypokalemia 03/2016  . Insomnia   . Neck pain, chronic   . Nocturia   . OSA on CPAP    uses cpap  . Pedal edema    Chronic  . Postherpetic neuralgia   . Shingles     Assessment/Plan: 1 Day Post-Op Procedure(s) (LRB): LEFT TOTAL HIP ARTHROPLASTY ANTERIOR APPROACH (Left) Principal Problem:   OA (osteoarthritis) of hip  Estimated body mass index is 35.36 kg/m as calculated from the following:   Height as of this encounter: 5' 4" (1.626 m).   Weight as of this encounter: 93.4 kg (206 lb). Up with therapy Discharge home with home health  DVT Prophylaxis - Xarelto Weight Bearing As Tolerated left Leg Hemovac Pulled Begin Therapy  If meets goals and able to go home: Discharge home with home health if met goals Diet - Cardiac diet Follow up - in 2 weeks Activity - WBAT Disposition - Home Condition Upon Discharge - pending therapy D/C Meds - See DC Summary DVT  Prophylaxis - Xarelto  Arlee Muslim, PA-C Orthopaedic Surgery 12/13/2017, 7:12 AM

## 2017-12-13 NOTE — Progress Notes (Signed)
Discharge planning, spoke with patient at bedside. Have chosen [facility] for [facility] PT, evaluate and treat. Contacted [facility] for referral. Has a RW but needs a 3n1, contacted [facility] to deliver to room. [phone]

## 2017-12-13 NOTE — Progress Notes (Signed)
Physical Therapy Treatment Patient Details Name: Mark Zamora MRN: [id number] DOB: 29-Nov-1945 Today's Date: 12/13/2017    History of Present Illness Pt s/p L THR and with hx of MI and CAD    PT Comments    Pt motivated and progressing well with mobility.  Reviewed home therex, stairs, car transfers and lower body dressing.   Follow Up Recommendations  Home health PT;DC plan and follow up therapy as arranged by surgeon     Equipment Recommendations  3in1 (PT)    Recommendations for Other Services       Precautions / Restrictions Precautions Precautions: Fall Restrictions Weight Bearing Restrictions: No Other Position/Activity Restrictions: WBAT    Mobility  Bed Mobility Overal bed mobility: Needs Assistance Bed Mobility: Sit to Supine     Supine to sit: Min assist Sit to supine: Min guard   General bed mobility comments: cues for sequence and use of R LE to self assist.  Physical assist to bring trunk to upright sitting  Transfers Overall transfer level: Needs assistance Equipment used: Rolling walker (2 wheeled) Transfers: Sit to/from Stand Sit to Stand: Min guard;Supervision         General transfer comment: cues for LE management and use of UEs to self assist  Ambulation/Gait Ambulation/Gait assistance: Min guard;Supervision Ambulation Distance (Feet): 160 Feet Assistive device: Rolling walker (2 wheeled) Gait Pattern/deviations: Step-to pattern;Step-through pattern;Decreased step length - right;Decreased step length - left;Shuffle;Trunk flexed Gait velocity: decr Gait velocity interpretation: Below normal speed for age/gender General Gait Details: cues for posture, position from RW and initial sequence   Stairs Stairs: Yes   Stair Management: No rails;Step to pattern;Forwards;With walker Number of Stairs: 6 General stair comments: single step twice and 2 step twice with RW and cues for sequence and foot/RW placement  Wheelchair Mobility     Modified Rankin (Stroke Patients Only)       Balance                                            Cognition Arousal/Alertness: Awake/alert Behavior During Therapy: WFL for tasks assessed/performed Overall Cognitive Status: Within Functional Limits for tasks assessed                                        Exercises Total Joint Exercises Ankle Circles/Pumps: AROM;Both;15 reps;Supine Quad Sets: AROM;Both;10 reps;Supine Heel Slides: AAROM;Left;20 reps;Supine Hip ABduction/ADduction: AAROM;Right;15 reps;Supine    General Comments        Pertinent Vitals/Pain Pain Assessment: 0-10 Pain Score: 5  Pain Location: L hip/thigh Pain Descriptors / Indicators: Aching;Burning;Sore Pain Intervention(s): Limited activity within patient's tolerance;Monitored during session;Premedicated before session;Ice applied    Home Living Family/patient expects to be discharged to:: Private residence Living Arrangements: Spouse/significant other Available Help at Discharge: Family Type of Home: House Home Access: Stairs to enter   Home Layout: One level Home Equipment: Environmental consultant - 2 wheels Additional Comments: Pt is home with spouse who has dementia and recently broke her non-dominant UE.  Pt states son and dtr will assist    Prior Function Level of Independence: Independent          PT Goals (current goals can now be found in the care plan section) Acute Rehab PT Goals Patient Stated Goal: Regain IND to care for spouse  PT Goal Formulation: With patient Time For Goal Achievement: 12/15/17 Potential to Achieve Goals: Good Progress towards PT goals: Progressing toward goals    Frequency    7X/week      PT Plan Current plan remains appropriate;Discharge plan needs to be updated    Co-evaluation              AM-PAC PT "6 Clicks" Daily Activity  Outcome Measure  Difficulty turning over in bed (including adjusting bedclothes, sheets and  blankets)?: A Lot Difficulty moving from lying on back to sitting on the side of the bed? : Unable Difficulty sitting down on and standing up from a chair with arms (e.g., wheelchair, bedside commode, etc,.)?: A Lot Help needed moving to and from a bed to chair (including a wheelchair)?: A Little Help needed walking in hospital room?: A Little Help needed climbing 3-5 steps with a railing? : A Little 6 Click Score: 14    End of Session Equipment Utilized During Treatment: Gait belt Activity Tolerance: Patient tolerated treatment well Patient left: in chair;with call bell/phone within reach;with chair alarm set Nurse Communication: Mobility status PT Visit Diagnosis: Difficulty in walking, not elsewhere classified (R26.2)     Time: 8315-1761 PT Time Calculation (min) (ACUTE ONLY): 48 min  Charges:  $Gait Training: 8-22 mins $Therapeutic Exercise: 8-22 mins                    G Codes:       Pg [phone]    Petar Mucci 12/13/2017, 3:47 PM

## 2017-12-13 NOTE — Discharge Summary (Signed)
Physician Discharge Summary   Patient ID: Mark Zamora MRN: [id number] DOB/AGE: 1946-06-03 72 y.o.  Admit date: 12/12/2017 Discharge date: 12-13-2017  Primary Diagnosis:  Osteoarthritis of the Left  hip.    Admission Diagnoses:  Past Medical History:  Diagnosis Date  . AAA (abdominal aortic aneurysm) ([facility]) 01/29/2017   2.9cm  . Allergic rhinitis   . Arthritis    l ankle  . BPH (benign prostatic hypertrophy)   . Cancer ([facility])    HX SKIN CANCER  . Cataract, bilateral   . Chronic back pain   . Chronic left hip pain   . Constipation   . Depression   . Dyspnea    with exertion  . Essential hypertension   . GERD (gastroesophageal reflux disease)   . History of chronic sinusitis   . History of dizziness 03/2016  . History of skin cancer   . Hyperlipidemia   . Hypokalemia 03/2016  . Insomnia   . Neck pain, chronic   . Nocturia   . OSA on CPAP    uses cpap  . Pedal edema    Chronic  . Postherpetic neuralgia   . Shingles    Discharge Diagnoses:   Principal Problem:   OA (osteoarthritis) of hip  Estimated body mass index is 35.36 kg/m as calculated from the following:   Height as of this encounter: 5' 4" (1.626 m).   Weight as of this encounter: 93.4 kg (206 lb).  Procedure(s) (LRB): LEFT TOTAL HIP ARTHROPLASTY ANTERIOR APPROACH (Left)   Consults: None  HPI:  Mark Zamora is a 72 y.o. male who has advancedarthritis of their Left  hip with progressively worsening pain and  dysfunction.The patient has failed nonoperative management and presents for  total hip arthroplasty.    Laboratory Data: Admission on 12/12/2017  Component Date Value Ref Range Status  . WBC 12/13/2017 12.5* 4.0 - 10.5 K/uL Final  . RBC 12/13/2017 3.71* 4.22 - 5.81 MIL/uL Final  . Hemoglobin 12/13/2017 11.1* 13.0 - 17.0 g/dL Final  . HCT 12/13/2017 32.8* 39.0 - 52.0 % Final  . MCV 12/13/2017 88.4  78.0 - 100.0 fL Final  . MCH 12/13/2017 29.9  26.0 - 34.0 pg Final  . MCHC  12/13/2017 33.8  30.0 - 36.0 g/dL Final  . RDW 12/13/2017 13.4  11.5 - 15.5 % Final  . Platelets 12/13/2017 224  150 - 400 K/uL Final  . Sodium 12/13/2017 132* 135 - 145 mmol/L Final  . Potassium 12/13/2017 4.1  3.5 - 5.1 mmol/L Final  . Chloride 12/13/2017 101  101 - 111 mmol/L Final  . CO2 12/13/2017 24  22 - 32 mmol/L Final  . Glucose, Bld 12/13/2017 118* 65 - 99 mg/dL Final  . BUN 12/13/2017 14  6 - 20 mg/dL Final  . Creatinine, Ser 12/13/2017 0.78  0.61 - 1.24 mg/dL Final  . Calcium 12/13/2017 8.8* 8.9 - 10.3 mg/dL Final  . GFR calc non Af Amer 12/13/2017 >60  >60 mL/min Final  . GFR calc Af Amer 12/13/2017 >60  >60 mL/min Final   Comment: (NOTE) The eGFR has been calculated using the CKD EPI equation. This calculation has not been validated in all clinical situations. eGFR's persistently <60 mL/min signify possible Chronic Kidney Disease.   Georgiann Hahn gap 12/13/2017 7  5 - 15 Final  Hospital Outpatient Visit on 12/04/2017  Component Date Value Ref Range Status  . aPTT 12/04/2017 26  24 - 36 seconds Final  . WBC 12/04/2017 8.2  4.0 - 10.5 K/uL Final  . RBC 12/04/2017 4.46  4.22 - 5.81 MIL/uL Final  . Hemoglobin 12/04/2017 13.3  13.0 - 17.0 g/dL Final  . HCT 12/04/2017 39.8  39.0 - 52.0 % Final  . MCV 12/04/2017 89.2  78.0 - 100.0 fL Final  . MCH 12/04/2017 29.8  26.0 - 34.0 pg Final  . MCHC 12/04/2017 33.4  30.0 - 36.0 g/dL Final  . RDW 12/04/2017 13.7  11.5 - 15.5 % Final  . Platelets 12/04/2017 242  150 - 400 K/uL Final  . Sodium 12/04/2017 134* 135 - 145 mmol/L Final  . Potassium 12/04/2017 4.3  3.5 - 5.1 mmol/L Final  . Chloride 12/04/2017 100* 101 - 111 mmol/L Final  . CO2 12/04/2017 27  22 - 32 mmol/L Final  . Glucose, Bld 12/04/2017 95  65 - 99 mg/dL Final  . BUN 12/04/2017 14  6 - 20 mg/dL Final  . Creatinine, Ser 12/04/2017 0.80  0.61 - 1.24 mg/dL Final  . Calcium 12/04/2017 9.6  8.9 - 10.3 mg/dL Final  . Total Protein 12/04/2017 7.4  6.5 - 8.1 g/dL Final  .  Albumin 12/04/2017 4.1  3.5 - 5.0 g/dL Final  . AST 12/04/2017 24  15 - 41 U/L Final  . ALT 12/04/2017 17  17 - 63 U/L Final  . Alkaline Phosphatase 12/04/2017 63  38 - 126 U/L Final  . Total Bilirubin 12/04/2017 0.8  0.3 - 1.2 mg/dL Final  . GFR calc non Af Amer 12/04/2017 >60  >60 mL/min Final  . GFR calc Af Amer 12/04/2017 >60  >60 mL/min Final   Comment: (NOTE) The eGFR has been calculated using the CKD EPI equation. This calculation has not been validated in all clinical situations. eGFR's persistently <60 mL/min signify possible Chronic Kidney Disease.   . Anion gap 12/04/2017 7  5 - 15 Final  . Prothrombin Time 12/04/2017 12.6  11.4 - 15.2 seconds Final  . INR 12/04/2017 0.96   Final  . ABO/RH(D) 12/04/2017 A NEG   Final  . Antibody Screen 12/04/2017 NEG   Final  . Sample Expiration 12/04/2017 12/15/2017   Final  . Extend sample reason 12/04/2017 NO TRANSFUSIONS OR PREGNANCY IN THE PAST 3 MONTHS   Final  . MRSA, PCR 12/04/2017 NEGATIVE  NEGATIVE Final  . Staphylococcus aureus 12/04/2017 NEGATIVE  NEGATIVE Final   Comment: (NOTE) The Xpert SA Assay (FDA approved for NASAL specimens in patients 24 years of age and older), is one component of a comprehensive surveillance program. It is not intended to diagnose infection nor to guide or monitor treatment.   . ABO/RH(D) 12/04/2017 A NEG   Final     X-Rays:Dg Pelvis Portable  Result Date: 12/12/2017 CLINICAL DATA:  Status post left total hip arthroplasty. EXAM: PORTABLE PELVIS 1-2 VIEWS; DG C-ARM 1-60 MIN-NO REPORT COMPARISON:  None in PACs FINDINGS: The fluoro spot radiograph reveals placement of the prosthesis. Fluoro time reported is 10.4 seconds. The portable AP view of the pelvis reveals reasonable positioning of the prosthetic components. IMPRESSION: No immediate postprocedure complication following left total hip joint prosthesis placement. A surgical drainage tube is present. Electronically Signed   By: David  Martinique M.D.    On: 12/12/2017 16:48   Dg C-arm 1-60 Min-no Report  Result Date: 12/12/2017 Fluoroscopy was utilized by the requesting physician.  No radiographic interpretation.    EKG: Orders placed or performed during the hospital encounter of 12/04/17  . EKG 12 lead  . EKG 12 lead  Hospital Course: Patient was admitted to [facility] and taken to the OR and underwent the above state procedure without complications.  Patient tolerated the procedure well and was later transferred to the recovery room and then to the orthopaedic floor for postoperative care.  They were given PO and IV analgesics for pain control following their surgery.  They were given 24 hours of postoperative antibiotics of  Anti-infectives (From admission, onward)   Start     Dose/Rate Route Frequency Ordered Stop   12/12/17 2030  ceFAZolin (ANCEF) IVPB 2g/100 mL premix     2 g 200 mL/hr over 30 Minutes Intravenous Every 6 hours 12/12/17 1724 12/13/17 0241   12/12/17 1245  ceFAZolin (ANCEF) IVPB 2g/100 mL premix     2 g 200 mL/hr over 30 Minutes Intravenous On call to O.R. 12/12/17 1236 12/12/17 1422     and started on DVT prophylaxis in the form of Xarelto.   PT and OT were ordered for total hip protocol.  The patient was allowed to be WBAT with therapy. Discharge planning was consulted to help with postop disposition and equipment needs.  Patient had a decent night on the evening of surgery.  They started to get up OOB with therapy on day one.  Hemovac drain was pulled without difficulty.   Dressing was clean and dry. Patient was seen in rounds, did okay with therapy, and was ready to go home.  Diet - Cardiac diet Follow up - in 2 weeks Activity - WBAT Disposition - Home Condition Upon Discharge - pending therapy D/C Meds - See DC Summary DVT Prophylaxis - Xarelto     Discharge Instructions    Call MD / Call 911   Complete by:  As directed    If you experience chest pain or shortness of breath, CALL 911 and  be transported to the hospital emergency room.  If you develope a fever above 101 F, pus (white drainage) or increased drainage or redness at the wound, or calf pain, call your surgeon's office.   Change dressing   Complete by:  As directed    You may change your dressing dressing daily with sterile 4 x 4 inch gauze dressing and paper tape.  Do not submerge the incision under water.   Constipation Prevention   Complete by:  As directed    Drink plenty of fluids.  Prune juice may be helpful.  You may use a stool softener, such as Colace (over the counter) 100 mg twice a day.  Use MiraLax (over the counter) for constipation as needed.   Diet - low sodium heart healthy   Complete by:  As directed    Discharge instructions   Complete by:  As directed    Take Xarelto for two and a half more weeks, then discontinue Xarelto. Once the patient has completed the Xarelto, they may resume the 325 mg Aspirin.   Pick up stool softner and laxative for home use following surgery while on pain medications. Do not submerge incision under water. Please use good hand washing techniques while changing dressing each day. May shower starting three days after surgery. Please use a clean towel to pat the incision dry following showers. Continue to use ice for pain and swelling after surgery. Do not use any lotions or creams on the incision until instructed by your surgeon.  Wear both TED hose on both legs during the day every day for three weeks, but may remove the TED hose at night  at home.  Postoperative Constipation Protocol  Constipation - defined medically as fewer than three stools per week and severe constipation as less than one stool per week.  One of the most common issues patients have following surgery is constipation.  Even if you have a regular bowel pattern at home, your normal regimen is likely to be disrupted due to multiple reasons following surgery.  Combination of anesthesia, postoperative  narcotics, change in appetite and fluid intake all can affect your bowels.  In order to avoid complications following surgery, here are some recommendations in order to help you during your recovery period.  Colace (docusate) - Pick up an over-the-counter form of Colace or another stool softener and take twice a day as long as you are requiring postoperative pain medications.  Take with a full glass of water daily.  If you experience loose stools or diarrhea, hold the colace until you stool forms back up.  If your symptoms do not get better within 1 week or if they get worse, check with your doctor.  Dulcolax (bisacodyl) - Pick up over-the-counter and take as directed by the product packaging as needed to assist with the movement of your bowels.  Take with a full glass of water.  Use this product as needed if not relieved by Colace only.   MiraLax (polyethylene glycol) - Pick up over-the-counter to have on hand.  MiraLax is a solution that will increase the amount of water in your bowels to assist with bowel movements.  Take as directed and can mix with a glass of water, juice, soda, coffee, or tea.  Take if you go more than two days without a movement. Do not use MiraLax more than once per day. Call your doctor if you are still constipated or irregular after using this medication for 7 days in a row.  If you continue to have problems with postoperative constipation, please contact the office for further assistance and recommendations.  If you experience "the worst abdominal pain ever" or develop nausea or vomiting, please contact the office immediatly for further recommendations for treatment.   Do not sit on low chairs, stoools or toilet seats, as it may be difficult to get up from low surfaces   Complete by:  As directed    Driving restrictions   Complete by:  As directed    No driving until released by the physician.   Increase activity slowly as tolerated   Complete by:  As directed    Lifting  restrictions   Complete by:  As directed    No lifting until released by the physician.   Patient may shower   Complete by:  As directed    You may shower without a dressing once there is no drainage.  Do not wash over the wound.  If drainage remains, do not shower until drainage stops.   TED hose   Complete by:  As directed    Use stockings (TED hose) for 3 weeks on both leg(s).  You may remove them at night for sleeping.   Weight bearing as tolerated   Complete by:  As directed    Laterality:  left   Extremity:  Lower     Allergies as of 12/13/2017      Reactions   Compazine [prochlorperazine Edisylate] Anaphylaxis, Other (See Comments)   Pt states "any nausea med ending in "zine"    Cymbalta [duloxetine Hcl] Other (See Comments)   Urinary retention    Promethazine Anaphylaxis, Other (  See Comments)   seizures   Codeine Other (See Comments)   constipation   Other Other (See Comments)   All antiemetic meds ending -zine  Prefers to not take any narcotic pain meds      Medication List    STOP taking these medications   aspirin 325 MG tablet   HYDROcodone-acetaminophen 5-325 MG tablet Commonly known as:  NORCO   Melatonin 10 MG Caps   meloxicam 15 MG tablet Commonly known as:  MOBIC   multivitamin with minerals Tabs tablet   oxyCODONE-acetaminophen 5-325 MG tablet Commonly known as:  PERCOCET/ROXICET   sildenafil 20 MG tablet Commonly known as:  REVATIO   Vitamin D 2000 units Caps   VITAMIN E PO     TAKE these medications   acetaminophen 650 MG CR tablet Commonly known as:  TYLENOL Take 1,300 mg by mouth every 8 (eight) hours as needed for pain.   amLODipine 10 MG tablet Commonly known as:  NORVASC Take 10 mg by mouth every morning.   doxazosin 8 MG tablet Commonly known as:  CARDURA Take 8 mg by mouth at bedtime.   fluticasone 50 MCG/ACT nasal spray Commonly known as:  FLONASE Place 2 sprays into both nostrils at bedtime.   lisinopril 40 MG  tablet Commonly known as:  PRINIVIL,ZESTRIL Take 40 mg by mouth daily.   meclizine 12.5 MG tablet Commonly known as:  ANTIVERT Take 1 tablet (12.5 mg total) by mouth 3 (three) times daily as needed for dizziness.   omeprazole 20 MG capsule Commonly known as:  PRILOSEC Take 20 mg by mouth every morning.   oxyCODONE 5 MG immediate release tablet Commonly known as:  Oxy IR/ROXICODONE Take 1-2 tablets (5-10 mg total) by mouth every 4 (four) hours as needed for moderate pain or severe pain.   potassium chloride SA 20 MEQ tablet Commonly known as:  K-DUR,KLOR-CON Take 20 mEq by mouth daily.   pravastatin 80 MG tablet Commonly known as:  PRAVACHOL Take 80 mg by mouth every evening.   rivaroxaban 10 MG Tabs tablet Commonly known as:  XARELTO Take 1 tablet (10 mg total) by mouth daily with breakfast. Take Xarelto for two and a half more weeks following discharge from the hospital, then discontinue Xarelto. Once the patient has completed the Xarelto, they may resume the 325 mg Aspirin.   spironolactone 25 MG tablet Commonly known as:  ALDACTONE Take 25 mg by mouth daily.   tiZANidine 4 MG capsule Commonly known as:  ZANAFLEX Take 1 capsule (4 mg total) by mouth 3 (three) times daily.   traMADol 50 MG tablet Commonly known as:  ULTRAM Take 1-2 tablets (50-100 mg total) by mouth every 6 (six) hours as needed (mild pain).   zolpidem 12.5 MG CR tablet Commonly known as:  AMBIEN CR Take 12.5 mg by mouth at bedtime.            Discharge Care Instructions  (From admission, onward)        Start     Ordered   12/13/17 0000  Weight bearing as tolerated    Question Answer Comment  Laterality left   Extremity Lower      12/13/17 0722   12/13/17 0000  Change dressing    Comments:  You may change your dressing dressing daily with sterile 4 x 4 inch gauze dressing and paper tape.  Do not submerge the incision under water.   12/13/17 7829     Follow-up Information  Gaynelle Arabian, MD. Schedule an appointment as soon as possible for a visit on 12/25/2017.   Specialty:  Orthopedic Surgery Contact information: [address] [place] [address] [phone]           Signed: Arlee Muslim, PA-C Orthopaedic Surgery 12/13/2017, 7:23 AM

## 2018-11-27 DIAGNOSIS — J449 Chronic obstructive pulmonary disease, unspecified: Secondary | ICD-10-CM

## 2018-11-27 HISTORY — DX: Chronic obstructive pulmonary disease, unspecified: J44.9

## 2019-12-16 ENCOUNTER — Other Ambulatory Visit: Payer: Self-pay | Admitting: Family Medicine

## 2019-12-16 ENCOUNTER — Ambulatory Visit
Admission: RE | Admit: 2019-12-16 | Discharge: 2019-12-16 | Disposition: A | Discharge status: Home / Self Care | Payer: Medicare Other | Source: Ambulatory Visit | Attending: Family Medicine | Admitting: Family Medicine

## 2019-12-16 DIAGNOSIS — R079 Chest pain, unspecified: Secondary | ICD-10-CM

## 2019-12-30 ENCOUNTER — Other Ambulatory Visit: Payer: Self-pay | Admitting: *Deleted

## 2019-12-30 ENCOUNTER — Telehealth: Payer: Self-pay | Admitting: *Deleted

## 2019-12-30 DIAGNOSIS — R079 Chest pain, unspecified: Secondary | ICD-10-CM

## 2019-12-30 DIAGNOSIS — I499 Cardiac arrhythmia, unspecified: Secondary | ICD-10-CM

## 2019-12-30 DIAGNOSIS — R002 Palpitations: Secondary | ICD-10-CM

## 2019-12-30 NOTE — Telephone Encounter (Signed)
Reviewed monitor instructions with patient.

## 2019-12-30 NOTE — Telephone Encounter (Signed)
Patient enrolled for [employer] to ship a 30 day cardiac event monitor to his home.   

## 2020-01-06 ENCOUNTER — Encounter: Payer: Self-pay | Admitting: Internal Medicine

## 2020-01-06 ENCOUNTER — Ambulatory Visit (INDEPENDENT_AMBULATORY_CARE_PROVIDER_SITE_OTHER): Payer: Medicare Other

## 2020-01-06 DIAGNOSIS — R002 Palpitations: Secondary | ICD-10-CM | POA: Diagnosis not present

## 2020-01-06 DIAGNOSIS — R079 Chest pain, unspecified: Secondary | ICD-10-CM | POA: Diagnosis not present

## 2020-01-06 DIAGNOSIS — I499 Cardiac arrhythmia, unspecified: Secondary | ICD-10-CM | POA: Diagnosis not present

## 2020-01-22 ENCOUNTER — Telehealth: Payer: Self-pay | Admitting: Medical

## 2020-01-22 NOTE — Telephone Encounter (Signed)
I was notified by [employer] of critical EKG. I called and they reported that the 'passed out' button had been pushed. They had tried to contact the patient but could not. They reported EKG showed NSR HR 87 with no ectopy during the event.   I got a hold of the patient and he said he did not pass out but must've accidentally pressed the button. In epic I cannot see where he has ever been seen by [facility]. He said the monitor was ordered by Dr. Estill Bamberg of [facility] physicians for chest pain and palpitations. He does have follow-up arranged with him.   Mark Zamora Kathlen Mody, PA-C

## 2020-02-18 ENCOUNTER — Telehealth: Payer: Self-pay | Admitting: Medical

## 2020-02-18 NOTE — Telephone Encounter (Signed)
Mark Zamora from Dr. Noland Fordyce office was hoping to get a copy of the patient's heart monitor results sent to their office. Results can be faxed to them at [phone].  The number provided is the main office line. Please ask for Mark Zamora

## 2020-02-20 NOTE — Telephone Encounter (Signed)
Spoke with Almyra Free at Dr. Noland Fordyce office.  Per Markus Daft: [employer] has not posted the End of Service report on their website yet. I sent them a message to post ASAP. Once the results post , I can import them and have our DOD read. The results will drop automatically to ordering physician once the test has been read.      Almyra Free verbalized understanding.

## 2020-03-17 ENCOUNTER — Ambulatory Visit
Admission: RE | Admit: 2020-03-17 | Discharge: 2020-03-17 | Disposition: A | Discharge status: Home / Self Care | Payer: Medicare Other | Source: Ambulatory Visit | Attending: Family Medicine | Admitting: Family Medicine

## 2020-03-17 ENCOUNTER — Other Ambulatory Visit: Payer: Self-pay | Admitting: Family Medicine

## 2020-03-17 DIAGNOSIS — S20212A Contusion of left front wall of thorax, initial encounter: Secondary | ICD-10-CM

## 2020-03-17 DIAGNOSIS — S60222A Contusion of left hand, initial encounter: Secondary | ICD-10-CM

## 2020-12-01 DIAGNOSIS — G4700 Insomnia, unspecified: Secondary | ICD-10-CM | POA: Diagnosis not present

## 2020-12-01 DIAGNOSIS — E782 Mixed hyperlipidemia: Secondary | ICD-10-CM | POA: Diagnosis not present

## 2020-12-01 DIAGNOSIS — K219 Gastro-esophageal reflux disease without esophagitis: Secondary | ICD-10-CM | POA: Diagnosis not present

## 2020-12-01 DIAGNOSIS — D500 Iron deficiency anemia secondary to blood loss (chronic): Secondary | ICD-10-CM | POA: Diagnosis not present

## 2020-12-01 DIAGNOSIS — H269 Unspecified cataract: Secondary | ICD-10-CM | POA: Diagnosis not present

## 2020-12-01 DIAGNOSIS — I10 Essential (primary) hypertension: Secondary | ICD-10-CM | POA: Diagnosis not present

## 2020-12-01 DIAGNOSIS — M1612 Unilateral primary osteoarthritis, left hip: Secondary | ICD-10-CM | POA: Diagnosis not present

## 2020-12-24 DIAGNOSIS — R3915 Urgency of urination: Secondary | ICD-10-CM | POA: Diagnosis not present

## 2021-01-14 DIAGNOSIS — M1612 Unilateral primary osteoarthritis, left hip: Secondary | ICD-10-CM | POA: Diagnosis not present

## 2021-01-14 DIAGNOSIS — D500 Iron deficiency anemia secondary to blood loss (chronic): Secondary | ICD-10-CM | POA: Diagnosis not present

## 2021-01-14 DIAGNOSIS — E782 Mixed hyperlipidemia: Secondary | ICD-10-CM | POA: Diagnosis not present

## 2021-01-14 DIAGNOSIS — G4700 Insomnia, unspecified: Secondary | ICD-10-CM | POA: Diagnosis not present

## 2021-01-14 DIAGNOSIS — H269 Unspecified cataract: Secondary | ICD-10-CM | POA: Diagnosis not present

## 2021-01-14 DIAGNOSIS — I10 Essential (primary) hypertension: Secondary | ICD-10-CM | POA: Diagnosis not present

## 2021-01-14 DIAGNOSIS — K219 Gastro-esophageal reflux disease without esophagitis: Secondary | ICD-10-CM | POA: Diagnosis not present

## 2021-02-18 DIAGNOSIS — R3915 Urgency of urination: Secondary | ICD-10-CM | POA: Diagnosis not present

## 2021-03-14 DIAGNOSIS — G4700 Insomnia, unspecified: Secondary | ICD-10-CM | POA: Diagnosis not present

## 2021-03-14 DIAGNOSIS — D500 Iron deficiency anemia secondary to blood loss (chronic): Secondary | ICD-10-CM | POA: Diagnosis not present

## 2021-03-14 DIAGNOSIS — I10 Essential (primary) hypertension: Secondary | ICD-10-CM | POA: Diagnosis not present

## 2021-03-14 DIAGNOSIS — K219 Gastro-esophageal reflux disease without esophagitis: Secondary | ICD-10-CM | POA: Diagnosis not present

## 2021-03-14 DIAGNOSIS — M1612 Unilateral primary osteoarthritis, left hip: Secondary | ICD-10-CM | POA: Diagnosis not present

## 2021-03-14 DIAGNOSIS — E782 Mixed hyperlipidemia: Secondary | ICD-10-CM | POA: Diagnosis not present

## 2021-03-14 DIAGNOSIS — H269 Unspecified cataract: Secondary | ICD-10-CM | POA: Diagnosis not present

## 2021-03-21 DIAGNOSIS — Z0000 Encounter for general adult medical examination without abnormal findings: Secondary | ICD-10-CM | POA: Diagnosis not present

## 2021-03-21 DIAGNOSIS — R609 Edema, unspecified: Secondary | ICD-10-CM | POA: Diagnosis not present

## 2021-03-21 DIAGNOSIS — E782 Mixed hyperlipidemia: Secondary | ICD-10-CM | POA: Diagnosis not present

## 2021-03-21 DIAGNOSIS — I714 Abdominal aortic aneurysm, without rupture: Secondary | ICD-10-CM | POA: Diagnosis not present

## 2021-03-21 DIAGNOSIS — M545 Low back pain, unspecified: Secondary | ICD-10-CM | POA: Diagnosis not present

## 2021-03-21 DIAGNOSIS — I493 Ventricular premature depolarization: Secondary | ICD-10-CM | POA: Diagnosis not present

## 2021-03-21 DIAGNOSIS — G4700 Insomnia, unspecified: Secondary | ICD-10-CM | POA: Diagnosis not present

## 2021-03-21 DIAGNOSIS — Z1389 Encounter for screening for other disorder: Secondary | ICD-10-CM | POA: Diagnosis not present

## 2021-03-21 DIAGNOSIS — I10 Essential (primary) hypertension: Secondary | ICD-10-CM | POA: Diagnosis not present

## 2021-03-21 DIAGNOSIS — M542 Cervicalgia: Secondary | ICD-10-CM | POA: Diagnosis not present

## 2021-04-20 DIAGNOSIS — Z8719 Personal history of other diseases of the digestive system: Secondary | ICD-10-CM | POA: Diagnosis not present

## 2021-04-29 ENCOUNTER — Other Ambulatory Visit: Payer: Medicare Other | Admitting: Hospice

## 2021-04-29 ENCOUNTER — Other Ambulatory Visit: Payer: Self-pay

## 2021-04-29 DIAGNOSIS — Z515 Encounter for palliative care: Secondary | ICD-10-CM | POA: Diagnosis not present

## 2021-04-29 DIAGNOSIS — J440 Chronic obstructive pulmonary disease with acute lower respiratory infection: Secondary | ICD-10-CM | POA: Diagnosis not present

## 2021-04-29 DIAGNOSIS — M1610 Unilateral primary osteoarthritis, unspecified hip: Secondary | ICD-10-CM | POA: Diagnosis not present

## 2021-04-29 DIAGNOSIS — J209 Acute bronchitis, unspecified: Secondary | ICD-10-CM | POA: Diagnosis not present

## 2021-04-29 NOTE — Progress Notes (Signed)
  [facility] Consult Note Telephone: [phone]  Fax: [phone]  PATIENT NAME: Mark Zamora [address] [place] [address] [phone] (home)  DOB: 09/17/1946 MRN: [id number]  PRIMARY CARE PROVIDER:    Reade, Robert, MD,  [address] [place] [address] [phone]  REFERRING PROVIDER:   Reade, Robert, MD [address] [place],  [place] [address] [phone]  RESPONSIBLE PARTY:   Self [phone] cell best number to call Contact Information    Name Relation Home Work Mobile   Zamora,Mark Daughter [phone]  [phone]   Zamora,Mark Spouse [phone]  [phone]       I met face to face with patient and family at home. Palliative Care was asked to follow this patient by consultation request of  Reade, Robert, MD to address advance care planning, complex medical decision making and goals of care clarification. This is the initial visit.    ASSESSMENT AND / RECOMMENDATIONS:   Advance Care Planning: Our advance care planning conversation included Zamora discussion about:     The value and importance of advance care planning   Difference between Hospice and Palliative care  Exploration of goals of care in the event of Zamora sudden injury or illness   Identification and preparation of Zamora healthcare agent   Review and updating or creation of an  advance directive document .  CODE STATUS: Discission on code status. Patient is a Do Not Resuscitate.  Patient has Zamora signed DNR form at home; same document uploaded to epic today.  Goals of Care: Goals include to maximize quality of life and symptom management. Patient is interested in hospice service in the future when he is appropriate for it.  MOST form given to patient in preparation for next visit.  I spent 46  minutes providing this initial consultation. More than 50% of the time in this consultation was spent on  counseling patient and coordinating communication. --------------------------------------------------------------------------------------------------------------------------------------  Symptom Management/Plan: COPD: Dyspnea on moderate to severe exertion, no recent exacerbation. Continue Albuterol and Symbicort as ordered. Uses CPAP at night.  Discussion on avoiding triggers, pursed lip breathing and adherence to breathing treatments. Insomnia: Continue Zolpidem and Melatonin as ordered.  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  Osteoarthritis of hip/right knee: Continue with Tylenol and Tizanidine. Heating pad as needed; continue to walk 2 miles Zamora day as currently doing.   Routine CBC BMP Follow up: Palliative care will continue to follow for complex medical decision making, advance care planning, and clarification of goals. Return 6 weeks or prn.Encouraged to call provider sooner with any concerns.   Family /Caregiver/Community Supports: Patient lives at home with spouse and son.   PPS: 60%  HOSPICE ELIGIBILITY/DIAGNOSIS: TBD  Chief Complaint: Initial Palliative care visit:COPD  HISTORY OF PRESENT ILLNESS:  Mark Zamora is Zamora 74 y.o. year old male  with multiple medical conditions including COPD which is chronic with occasional shortness of breath on moderate to severe exertion; associated symptoms of shortness of breath, weakness sometimes impairs his activities of living and quality of life;  he reports no recent exacerbation.  Patient reports his COPD is worse with strenous activities; it is alleviated with rest and breathing treatments. History of COPD depression, BPH, dizziness, constipation HTN, skin Cancer at back of right shoulder, Meniscus repair in bilateral knees/ left ankle and total hip replacement History obtained from review of EMR, discussion with primary team,   caregiver, family and/or Mark Zamora.  Review and summarization of  Epic records shows history from other than patient. Rest of 10 point ROS asked and negative.    ROS General: NAD EYES: denies vision changes ENMT: denies dysphagia Cardiovascular: denies chest pain/discomfort Pulmonary: denies cough, endorses occasional shortness of breath on exertion Abdomen: endorses good appetite, denies constipation/diarrhea GU: denies dysuria, urinary frequency MSK:  denies weakness,  no falls reported Skin: denies rashes or wounds Neurological: denies pain, denies insomnia Psych: Endorses positive mood Heme/lymph/immuno: denies bruises, abnormal bleeding  Physical Exam: Height/Weight 5 feet 4 inches/187 Ibs BP 132/78 P57 R18 02 97% RA Constitutional: NAD General: Well groomed, cooperative EYES: anicteric sclera, lids intact, no discharge  ENMT: Moist mucous membrane CV: S1 S2, RRR, no LE edema Pulmonary: LCTA, no increased work of breathing, no cough Abdomen: active BS + 4 quadrants, soft and non tender GU: no suprapubic tenderness MSK: ambulatory Skin: warm and dry, no rashes or wounds on visible skin Neuro:  weakness, otherwise non focal Psych: non-anxious affect Hem/lymph/immuno: no widespread bruising  Reviewed Labs Results for Mark Zamora (MRN [id number]) as of 04/29/2021 16:28  Ref. Range 12/13/2017 05:46  Sodium Latest Ref Range: 135 - 145 mmol/L 132 (L)  Potassium Latest Ref Range: 3.5 - 5.1 mmol/L 4.1  Chloride Latest Ref Range: 101 - 111 mmol/L 101  CO2 Latest Ref Range: 22 - 32 mmol/L 24  Glucose Latest Ref Range: 65 - 99 mg/dL 118 (H)  BUN Latest Ref Range: 6 - 20 mg/dL 14  Creatinine Latest Ref Range: 0.61 - 1.24 mg/dL 0.78  Calcium Latest Ref Range: 8.9 - 10.3 mg/dL 8.8 (L)  Anion gap Latest Ref Range: 5 - 15  7  GFR, Est Non African American Latest Ref Range: >60 mL/min >60  GFR, Est African American Latest Ref Range: >60 mL/min >60    PAST MEDICAL HISTORY:  Active Ambulatory Problems    Diagnosis Date Noted  . Acute medial  meniscal tear 05/18/2015  . BPH (benign prostatic hyperplasia) 06/25/2015  . OA (osteoarthritis) of hip 12/12/2017   Resolved Ambulatory Problems    Diagnosis Date Noted  . No Resolved Ambulatory Problems   Past Medical History:  Diagnosis Date  . AAA (abdominal aortic aneurysm) (HCC) 01/29/2017  . Allergic rhinitis   . Arthritis   . BPH (benign prostatic hypertrophy)   . Cancer (HCC)   . Cataract, bilateral   . Chronic back pain   . Chronic left hip pain   . Constipation   . Depression   . Dyspnea   . Essential hypertension   . GERD (gastroesophageal reflux disease)   . History of chronic sinusitis   . History of dizziness 03/2016  . History of skin cancer   . Hyperlipidemia   . Hypokalemia 03/2016  . Insomnia   . Neck pain, chronic   . Nocturia   . OSA on CPAP   . Pedal edema   . Postherpetic neuralgia   . Shingles     SOCIAL HX:  Social History   Tobacco Use  . Smoking status: Former Smoker    Quit date: 05/10/1994    Years since quitting: 26.9  . Smokeless tobacco: Never Used  Substance Use Topics  . Alcohol use: Yes    Alcohol/week: 3.0 standard drinks    Types: 3 Shots of liquor per week    Comment: 7 DRINKS PER WEEK     FAMILY HX:  Family History  Problem Relation Age of Onset  .   Hypercholesterolemia Mother   . Stroke Mother        diagnosed w/DM,CVD  . CVA Father   . Seizures Father       ALLERGIES:  Allergies  Allergen Reactions  . Compazine [Prochlorperazine Edisylate] Anaphylaxis and Other (See Comments)    Pt states "any nausea med ending in "zine"   . Cymbalta [Duloxetine Hcl] Other (See Comments)    Urinary retention   . Promethazine Anaphylaxis and Other (See Comments)    seizures  . Codeine Other (See Comments)    constipation  . Other Other (See Comments)    All antiemetic meds ending -zine  Prefers to not take any narcotic pain meds      PERTINENT MEDICATIONS:  Outpatient Encounter Medications as of 04/29/2021  Medication  Sig  . acetaminophen (TYLENOL) 650 MG CR tablet Take 1,300 mg by mouth every 8 (eight) hours as needed for pain.  . amLODipine (NORVASC) 10 MG tablet Take 10 mg by mouth every morning.   . doxazosin (CARDURA) 8 MG tablet Take 8 mg by mouth at bedtime.  . fluticasone (FLONASE) 50 MCG/ACT nasal spray Place 2 sprays into both nostrils at bedtime.   . lisinopril (PRINIVIL,ZESTRIL) 40 MG tablet Take 40 mg by mouth daily.  . meclizine (ANTIVERT) 12.5 MG tablet Take 1 tablet (12.5 mg total) by mouth 3 (three) times daily as needed for dizziness. (Patient not taking: Reported on 11/23/2017)  . omeprazole (PRILOSEC) 20 MG capsule Take 20 mg by mouth every morning.   . oxyCODONE (OXY IR/ROXICODONE) 5 MG immediate release tablet Take 1-2 tablets (5-10 mg total) by mouth every 4 (four) hours as needed for moderate pain or severe pain.  . potassium chloride SA (K-DUR,KLOR-CON) 20 MEQ tablet Take 20 mEq by mouth daily.   . pravastatin (PRAVACHOL) 80 MG tablet Take 80 mg by mouth every evening.   . rivaroxaban (XARELTO) 10 MG TABS tablet Take 1 tablet (10 mg total) by mouth daily with breakfast. Take Xarelto for two and Zamora half more weeks following discharge from the hospital, then discontinue Xarelto. Once the patient has completed the Xarelto, they may resume the 325 mg Aspirin.  . spironolactone (ALDACTONE) 25 MG tablet Take 25 mg by mouth daily.  . tiZANidine (ZANAFLEX) 4 MG capsule Take 1 capsule (4 mg total) by mouth 3 (three) times daily.  . traMADol (ULTRAM) 50 MG tablet Take 1-2 tablets (50-100 mg total) by mouth every 6 (six) hours as needed (mild pain).  . zolpidem (AMBIEN CR) 12.5 MG CR tablet Take 12.5 mg by mouth at bedtime.   No facility-administered encounter medications on file as of 04/29/2021.     Thank you for the opportunity to participate in the care of Mr. Neuman.  The palliative care team will continue to follow. Please call our office at [phone] if we can be of additional  assistance.   Note: Portions of this note were generated with Dragon dictation software. Dictation errors may occur despite best attempts at proofreading.  Livina I Eshiet, NP     

## 2021-05-11 DIAGNOSIS — R3915 Urgency of urination: Secondary | ICD-10-CM | POA: Diagnosis not present

## 2021-06-16 ENCOUNTER — Other Ambulatory Visit: Payer: Medicare Other | Admitting: Hospice

## 2021-06-16 ENCOUNTER — Other Ambulatory Visit: Payer: Self-pay

## 2021-06-16 DIAGNOSIS — J209 Acute bronchitis, unspecified: Secondary | ICD-10-CM

## 2021-06-16 DIAGNOSIS — M1610 Unilateral primary osteoarthritis, unspecified hip: Secondary | ICD-10-CM

## 2021-06-16 DIAGNOSIS — J440 Chronic obstructive pulmonary disease with acute lower respiratory infection: Secondary | ICD-10-CM | POA: Diagnosis not present

## 2021-06-16 DIAGNOSIS — Z515 Encounter for palliative care: Secondary | ICD-10-CM | POA: Diagnosis not present

## 2021-06-16 DIAGNOSIS — G4700 Insomnia, unspecified: Secondary | ICD-10-CM | POA: Diagnosis not present

## 2021-06-17 NOTE — Progress Notes (Signed)
[facility] Consult Note Telephone: [phone]  Fax: [phone]  PATIENT NAME: Mark Zamora DOB: May 23, 1946 MRN: [id number]  PRIMARY CARE PROVIDER:   Maury Dus, MD Maury Dus, MD [place],  [place] [address]  REFERRING PROVIDER: Maury Dus, MD Maury Dus, MD [place],  [place] [address]  RESPONSIBLE PARTY: Self [phone] Best number to call Contact Information     Name Relation Home Work Mobile   [place] [facility] Daughter [phone]  [phone]   Derrol, Sharman [phone]  [phone]      TELEHEALTH VISIT STATEMENT Due to the COVID-19 crisis, this visit was done via telemedicine and it was initiated and consent by this patient and or family. Video-audio (telehealth) contact was unable to be done due to technical barriers from the patient's side.   Visit is to build trust and highlight Palliative Medicine as specialized medical care for people living with serious illness, aimed at facilitating better quality of life through symptoms relief, assisting with advance care planning and complex medical decision making. This is a follow up visit.  RECOMMENDATIONS/PLAN:   Advance Care Planning/Code Status: Patient is a DO NOT RESUSCITATE  Goals of Care: Goals of care include to maximize quality of life and symptom management.  Visit consisted of counseling and education dealing with the complex and emotionally intense issues of symptom management and palliative care in the setting of serious and potentially life-threatening illness.  Spouse reports being mentally exhausted due to caregiver burden.  Discussion on self-care.  He acknowledged and expressed appreciation that patient now goes to adult daycare 2 days a week.  Therapeutic listening and ample emotional support provided.  Palliative care team will continue to support patient, patient's family, and  medical team.  Symptom management/Plan:  COPD: Patient is compliant with breathing treatments and CPAP at night.  No COPD exacerbation at this time.  He continues to work at least 2 miles daily.[place]  He reports avoiding triggers and practicing pursed lip breathing. Osteoarthritis: Hip/right knee. Managed with Tylenol and tizanidine as well as nonpharmacological heating pad on daily exercise. Insomnia: Patient continues on Zolpidem and Melatonin; he continues on his daily sleep hygiene.   Follow up: Palliative care will continue to follow for complex medical decision making, advance care planning, and clarification of goals. Return 6 weeks or prn. Encouraged to call provider sooner with any concerns.  CHIEF COMPLAINT: Palliative follow up  HISTORY OF PRESENT ILLNESS:  Mark Zamora a 75 y.o. male with multiple medical problems including COPD, Insomnia,  hip and right knee osteoarthritis. History of depression - in remission,  BPH, dizziness, constipation HTN, skin Cancer at back of right shoulder, Meniscus repair in bilateral knees/ left ankle and total hip replacement. History obtained from review of EMR, discussion with primary team, family and/or patient. Records reviewed and summarized above. All 10 point systems reviewed and are negative except as documented in history of present illness above  Review and summarization of Epic records shows history from other than patient.   Palliative Care was asked to follow this patient o help address complex decision making in the context of advance care planning and goals of care clarification.    PERTINENT MEDICATIONS:  Outpatient Encounter Medications as of 06/16/2021  Medication Sig   acetaminophen (TYLENOL) 650 MG CR tablet Take 1,300 mg by mouth every 8 (eight) hours as needed for pain.   amLODipine (NORVASC) 10 MG tablet Take 10 mg  by mouth every morning.    doxazosin (CARDURA) 8 MG tablet Take 8 mg by mouth at bedtime.   fluticasone (FLONASE)  50 MCG/ACT nasal spray Place 2 sprays into both nostrils at bedtime.    lisinopril (PRINIVIL,ZESTRIL) 40 MG tablet Take 40 mg by mouth daily.   meclizine (ANTIVERT) 12.5 MG tablet Take 1 tablet (12.5 mg total) by mouth 3 (three) times daily as needed for dizziness. (Patient not taking: Reported on 11/23/2017)   omeprazole (PRILOSEC) 20 MG capsule Take 20 mg by mouth every morning.    oxyCODONE (OXY IR/ROXICODONE) 5 MG immediate release tablet Take 1-2 tablets (5-10 mg total) by mouth every 4 (four) hours as needed for moderate pain or severe pain.   potassium chloride SA (K-DUR,KLOR-CON) 20 MEQ tablet Take 20 mEq by mouth daily.    pravastatin (PRAVACHOL) 80 MG tablet Take 80 mg by mouth every evening.    rivaroxaban (XARELTO) 10 MG TABS tablet Take 1 tablet (10 mg total) by mouth daily with breakfast. Take Xarelto for two and a half more weeks following discharge from the hospital, then discontinue Xarelto. Once the patient has completed the Xarelto, they may resume the 325 mg Aspirin.   spironolactone (ALDACTONE) 25 MG tablet Take 25 mg by mouth daily.   tiZANidine (ZANAFLEX) 4 MG capsule Take 1 capsule (4 mg total) by mouth 3 (three) times daily.   traMADol (ULTRAM) 50 MG tablet Take 1-2 tablets (50-100 mg total) by mouth every 6 (six) hours as needed (mild pain).   zolpidem (AMBIEN CR) 12.5 MG CR tablet Take 12.5 mg by mouth at bedtime.   No facility-administered encounter medications on file as of 06/16/2021.    HOSPICE ELIGIBILITY/DIAGNOSIS: TBD  PAST MEDICAL HISTORY:  Past Medical History:  Diagnosis Date   AAA (abdominal aortic aneurysm) ([facility]) 01/29/2017   2.9cm   Allergic rhinitis    Arthritis    l ankle   BPH (benign prostatic hypertrophy)    Cancer (HCC)    HX SKIN CANCER   Cataract, bilateral    Chronic back pain    Chronic left hip pain    Constipation    Depression    Dyspnea    with exertion   Essential hypertension    GERD (gastroesophageal reflux disease)     History of chronic sinusitis    History of dizziness 03/2016   History of skin cancer    Hyperlipidemia    Hypokalemia 03/2016   Insomnia    Neck pain, chronic    Nocturia    OSA on CPAP    uses cpap   Pedal edema    Chronic   Postherpetic neuralgia    Shingles      ALLERGIES:  Allergies  Allergen Reactions   Compazine [Prochlorperazine Edisylate] Anaphylaxis and Other (See Comments)    Pt states "any nausea med ending in "zine"    Cymbalta [Duloxetine Hcl] Other (See Comments)    Urinary retention    Promethazine Anaphylaxis and Other (See Comments)    seizures   Codeine Other (See Comments)    constipation   Other Other (See Comments)    All antiemetic meds ending -zine  Prefers to not take any narcotic pain meds      I spent 30 minutes providing this consultation; this includes time spent with patient/family, chart review and documentation. More than 50% of the time in this consultation was spent on counseling and coordinating communication   Thank you for the opportunity to participate in  the care of EIDAN DRUMMONDS Please call our office at [phone] if we can be of additional assistance.  Note: Portions of this note were generated with L[employer]. Dictation errors may occur despite best attempts at proofreading.  Teodoro Spray, NP

## 2021-06-23 DIAGNOSIS — K219 Gastro-esophageal reflux disease without esophagitis: Secondary | ICD-10-CM | POA: Diagnosis not present

## 2021-06-23 DIAGNOSIS — G4700 Insomnia, unspecified: Secondary | ICD-10-CM | POA: Diagnosis not present

## 2021-06-23 DIAGNOSIS — D500 Iron deficiency anemia secondary to blood loss (chronic): Secondary | ICD-10-CM | POA: Diagnosis not present

## 2021-06-23 DIAGNOSIS — M1612 Unilateral primary osteoarthritis, left hip: Secondary | ICD-10-CM | POA: Diagnosis not present

## 2021-06-23 DIAGNOSIS — E782 Mixed hyperlipidemia: Secondary | ICD-10-CM | POA: Diagnosis not present

## 2021-06-23 DIAGNOSIS — H269 Unspecified cataract: Secondary | ICD-10-CM | POA: Diagnosis not present

## 2021-06-23 DIAGNOSIS — I10 Essential (primary) hypertension: Secondary | ICD-10-CM | POA: Diagnosis not present

## 2021-07-22 ENCOUNTER — Other Ambulatory Visit: Payer: Medicare Other | Admitting: Hospice

## 2021-07-22 ENCOUNTER — Other Ambulatory Visit: Payer: Self-pay

## 2021-07-22 DIAGNOSIS — Z515 Encounter for palliative care: Secondary | ICD-10-CM

## 2021-07-22 DIAGNOSIS — J440 Chronic obstructive pulmonary disease with acute lower respiratory infection: Secondary | ICD-10-CM | POA: Diagnosis not present

## 2021-07-22 DIAGNOSIS — F439 Reaction to severe stress, unspecified: Secondary | ICD-10-CM

## 2021-07-22 DIAGNOSIS — J209 Acute bronchitis, unspecified: Secondary | ICD-10-CM | POA: Diagnosis not present

## 2021-07-22 DIAGNOSIS — M1610 Unilateral primary osteoarthritis, unspecified hip: Secondary | ICD-10-CM | POA: Diagnosis not present

## 2021-07-22 NOTE — Progress Notes (Signed)
[facility] Consult Note Telephone: [phone]  Fax: [phone]  PATIENT NAME: Mark Zamora DOB: 09/15/1946 MRN: [id number]  PRIMARY CARE PROVIDER:   Maury Dus, MD Maury Dus, [place],  [place] [address]  REFERRING PROVIDER: Maury Dus, MD Maury Dus, MD [place] [place],  [place] [address]  RESPONSIBLE PARTY:  Self [place]     Name Relation Home Work [place] Daughter [phone]  [phone]   [place], [place] [phone]  [phone]       Visit is to build trust and highlight Palliative Medicine as specialized medical care for people living with serious illness, aimed at facilitating better quality of life through symptoms relief, assisting with advance care planning and complex medical decision making. This is a follow up visit.  RECOMMENDATIONS/PLAN:   Advance Care Planning/Code Status: Patient is a DO NOT RESUSCITATE  Goals of Care: Goals of care include to maximize quality of life and symptom management.  Extensive discussion today goals of care clarification using medical orders for scope of treatment.  Most selections today include limited additional intervention, feeding tube for trial period time, IV fluids as indicated, antibiotics as indicated.  NP signed MOST form for patient to keep at home; same document uploaded to epic today.  Visit consisted of counseling and education dealing with the complex and emotionally intense issues of symptom management and palliative care in the setting of serious and potentially life-threatening illness. Palliative care team will continue to support patient, patient's family, and medical team.  Symptom management/Plan:  COPD: no recent exacerbation. Continue Albuterol and Symbicort as ordered. Uses CPAP at night.  Discussion on avoiding triggers, pursed lip breathing and  adherence to breathing treatments.  Patient able to go for 2 miles walk daily. HTN: Managed with Amlodipine, Lisinopril, Aldactone. Check BP at home daily and report BP consistently greater than [id number] systolic Insomnia: Continue Zolpidem and Melatonin as ordered.  Encourage sleep hygiene Stress: Associated with caring for his spouse at home.  Discussed getting needed help, deep breathing, de-escalation techniques, mindfulness. Depression: In remission with no medication at this time.  Osteoarthritis of hip/right knee: Managed with Tylenol and Tizanidine. Heating pad as needed.  Discussed exercises and movements to optimize joints and wellbeing.  Follow up: Palliative care will continue to follow for complex medical decision making, advance care planning, and clarification of goals. Return 6 weeks or prn. Encouraged to call provider sooner with any concerns.  CHIEF COMPLAINT: Palliative follow up  HISTORY OF PRESENT ILLNESS:  Mark PERI a 75 y.o. male with multiple medical problems including COPD with no recent exacerbation, depression, BPH, dizziness, constipation HTN, skin Cancer at back of right shoulder, Meniscus repair in bilateral knees/ left ankle and total hip replacement History.  Patient reports no pain/discomfort, overall doing well except for stress associated with taking care of his spouse.  History obtained from review of EMR, discussion with primary team, family and/or patient. Records reviewed and summarized above. All 10 point systems reviewed and are negative except as documented in history of present illness above  Review and summarization of Epic records shows history from other than patient.   Palliative Care was asked to follow this patient o help address complex decision making in the context of advance care planning and goals of care clarification.   PHYSICAL EXAM  BP 146/90 P 55 R 19 02 98% RA. Patient to recheck  today Height/Weight: 5 feet 5 inches/185 Ibs   General: In no acute distress, appropriately dressed Cardiovascular: regular rate and rhythm; no edema in BLE Pulmonary: no cough, no increased work of breathing, normal respiratory effort Abdomen: soft, non tender, no guarding, positive bowel sounds in all quadrants GU:  no suprapubic tenderness Eyes: Normal lids, no discharge ENMT: Moist mucous membranes Musculoskeletal:  weakness, sarcopenia Skin: no rash to visible skin, warm without cyanosis,  Psych: non-anxious affect Neurological: Weakness but otherwise non focal Heme/lymph/immuno: no bruises, no bleeding  PERTINENT MEDICATIONS:  Outpatient Encounter Medications as of 07/22/2021  Medication Sig   acetaminophen (TYLENOL) 650 MG CR tablet Take 1,300 mg by mouth every 8 (eight) hours as needed for pain.   amLODipine (NORVASC) 10 MG tablet Take 10 mg by mouth every morning.    doxazosin (CARDURA) 8 MG tablet Take 8 mg by mouth at bedtime.   fluticasone (FLONASE) 50 MCG/ACT nasal spray Place 2 sprays into both nostrils at bedtime.    lisinopril (PRINIVIL,ZESTRIL) 40 MG tablet Take 40 mg by mouth daily.   meclizine (ANTIVERT) 12.5 MG tablet Take 1 tablet (12.5 mg total) by mouth 3 (three) times daily as needed for dizziness. (Patient not taking: Reported on 11/23/2017)   omeprazole (PRILOSEC) 20 MG capsule Take 20 mg by mouth every morning.    oxyCODONE (OXY IR/ROXICODONE) 5 MG immediate release tablet Take 1-2 tablets (5-10 mg total) by mouth every 4 (four) hours as needed for moderate pain or severe pain.   potassium chloride SA (K-DUR,KLOR-CON) 20 MEQ tablet Take 20 mEq by mouth daily.    pravastatin (PRAVACHOL) 80 MG tablet Take 80 mg by mouth every evening.    rivaroxaban (XARELTO) 10 MG TABS tablet Take 1 tablet (10 mg total) by mouth daily with breakfast. Take Xarelto for two and a half more weeks following discharge from the hospital, then discontinue Xarelto. Once the patient has completed the Xarelto, they may resume the 325 mg  Aspirin.   spironolactone (ALDACTONE) 25 MG tablet Take 25 mg by mouth daily.   tiZANidine (ZANAFLEX) 4 MG capsule Take 1 capsule (4 mg total) by mouth 3 (three) times daily.   traMADol (ULTRAM) 50 MG tablet Take 1-2 tablets (50-100 mg total) by mouth every 6 (six) hours as needed (mild pain).   zolpidem (AMBIEN CR) 12.5 MG CR tablet Take 12.5 mg by mouth at bedtime.   No facility-administered encounter medications on file as of 07/22/2021.    HOSPICE ELIGIBILITY/DIAGNOSIS: TBD  PAST MEDICAL HISTORY:  Past Medical History:  Diagnosis Date   AAA (abdominal aortic aneurysm) ([facility]) 01/29/2017   2.9cm   Allergic rhinitis    Arthritis    l ankle   BPH (benign prostatic hypertrophy)    Cancer (HCC)    HX SKIN CANCER   Cataract, bilateral    Chronic back pain    Chronic left hip pain    Constipation    Depression    Dyspnea    with exertion   Essential hypertension    GERD (gastroesophageal reflux disease)    History of chronic sinusitis    History of dizziness 03/2016   History of skin cancer    Hyperlipidemia    Hypokalemia 03/2016   Insomnia    Neck pain, chronic    Nocturia    OSA on CPAP    uses cpap   Pedal edema    Chronic   Postherpetic neuralgia    Shingles       ALLERGIES:  Allergies  Allergen Reactions   Compazine [Prochlorperazine Edisylate] Anaphylaxis and Other (See Comments)    Pt states "any nausea med ending in "zine"    Cymbalta [Duloxetine Hcl] Other (See Comments)    Urinary retention    Promethazine Anaphylaxis and Other (See Comments)    seizures   Codeine Other (See Comments)    constipation   Other Other (See Comments)    All antiemetic meds ending -zine  Prefers to not take any narcotic pain meds      I spent 60 minutes providing this consultation; this includes time spent with patient/family, chart review and documentation. More than 50% of the time in this consultation was spent on counseling and coordinating communication   Thank  you for the opportunity to participate in the care of THURMON GREENEY Please call our office at [phone] if we can be of additional assistance.  Note: Portions of this note were generated with Lobbyist. Dictation errors may occur despite best attempts at proofreading.  Teodoro Spray, NP

## 2021-07-26 DIAGNOSIS — G4700 Insomnia, unspecified: Secondary | ICD-10-CM | POA: Diagnosis not present

## 2021-07-26 DIAGNOSIS — D500 Iron deficiency anemia secondary to blood loss (chronic): Secondary | ICD-10-CM | POA: Diagnosis not present

## 2021-07-26 DIAGNOSIS — I10 Essential (primary) hypertension: Secondary | ICD-10-CM | POA: Diagnosis not present

## 2021-07-26 DIAGNOSIS — K219 Gastro-esophageal reflux disease without esophagitis: Secondary | ICD-10-CM | POA: Diagnosis not present

## 2021-07-26 DIAGNOSIS — M1612 Unilateral primary osteoarthritis, left hip: Secondary | ICD-10-CM | POA: Diagnosis not present

## 2021-07-26 DIAGNOSIS — E782 Mixed hyperlipidemia: Secondary | ICD-10-CM | POA: Diagnosis not present

## 2021-07-26 DIAGNOSIS — H269 Unspecified cataract: Secondary | ICD-10-CM | POA: Diagnosis not present

## 2021-08-09 IMAGING — DX DG CHEST 2V
2 series · 2 of 2 positions shown · non-contrast
Comparison: 04/01/2016

CLINICAL DATA: Left-sided chest pain for 6 weeks.

EXAM:
CHEST - 2 VIEW

[dg chest 2 view (1 of 2)]
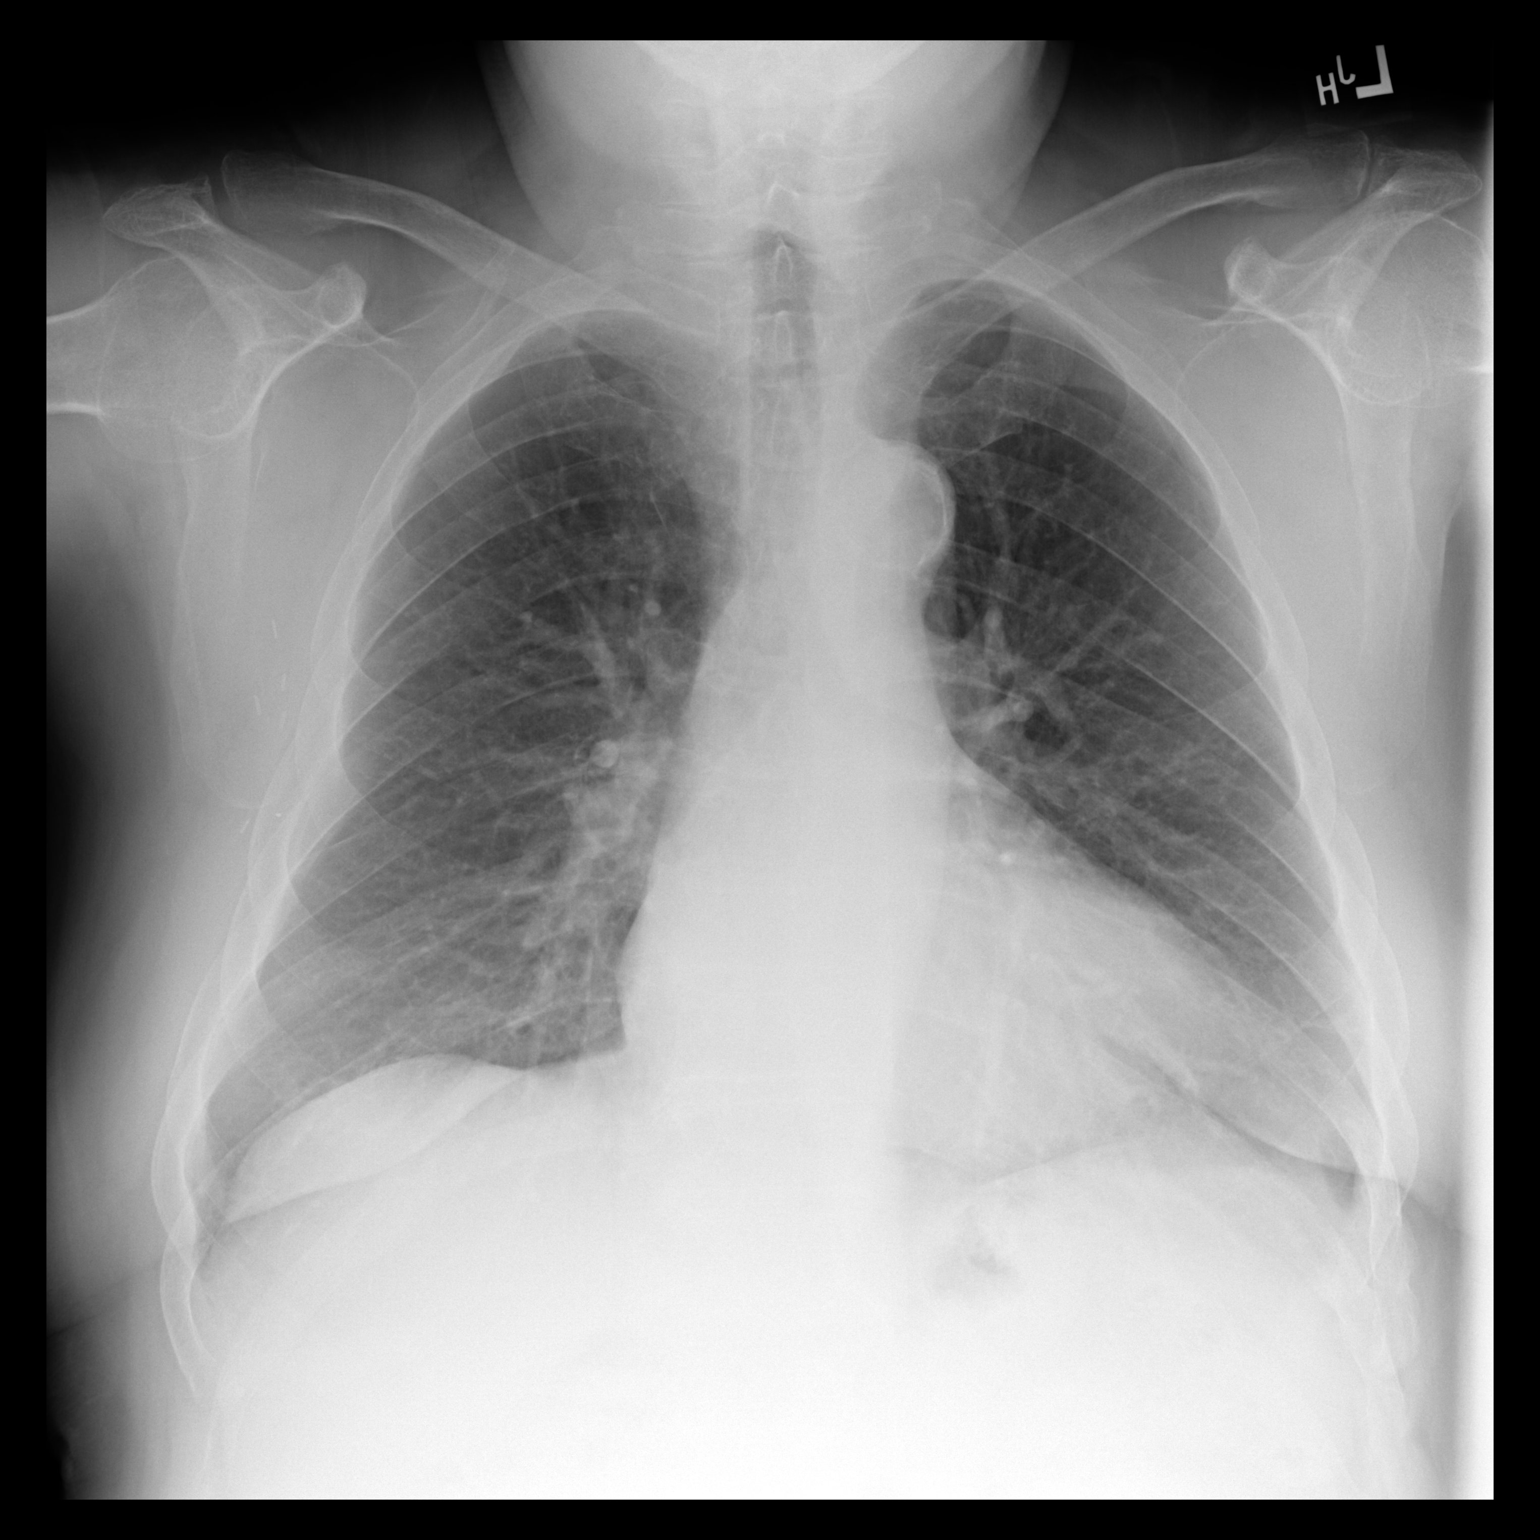

[dg chest 2 view (2 of 2)]
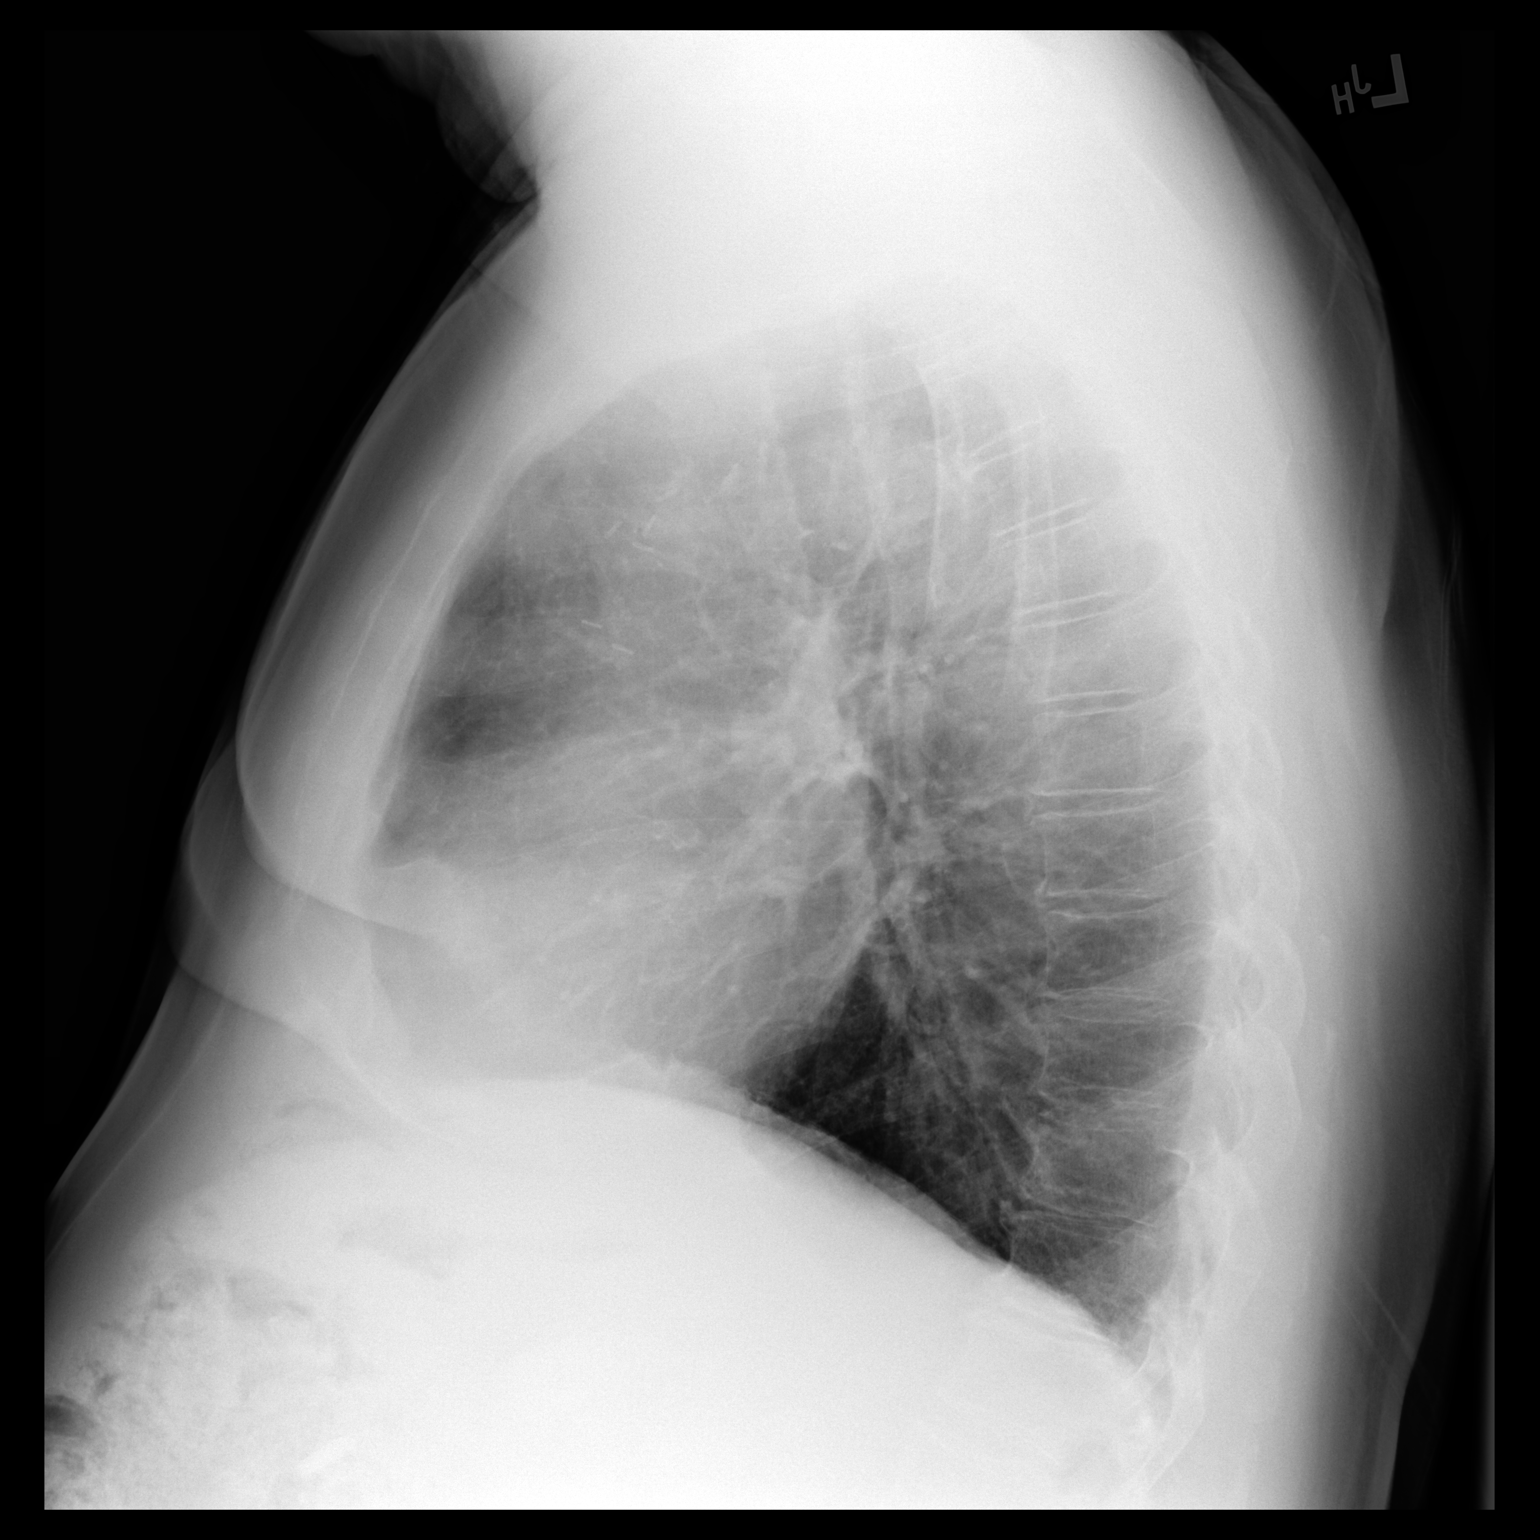

[2 of 2 positions shown; findings below may reference images not displayed]

FINDINGS: The heart size and mediastinal contours are within normal limits.
Aortic atherosclerosis incidentally noted. Both lungs are clear.
Surgical clips again seen within right axilla. The visualized
skeletal structures are unremarkable.
IMPRESSION: No active cardiopulmonary disease.

## 2021-08-24 DIAGNOSIS — Y92009 Unspecified place in unspecified non-institutional (private) residence as the place of occurrence of the external cause: Secondary | ICD-10-CM | POA: Diagnosis not present

## 2021-08-24 DIAGNOSIS — S79929A Unspecified injury of unspecified thigh, initial encounter: Secondary | ICD-10-CM | POA: Diagnosis not present

## 2021-08-24 DIAGNOSIS — W19XXXA Unspecified fall, initial encounter: Secondary | ICD-10-CM | POA: Diagnosis not present

## 2021-08-24 DIAGNOSIS — M79652 Pain in left thigh: Secondary | ICD-10-CM | POA: Diagnosis not present

## 2021-08-24 DIAGNOSIS — I10 Essential (primary) hypertension: Secondary | ICD-10-CM | POA: Diagnosis not present

## 2021-08-24 DIAGNOSIS — M79651 Pain in right thigh: Secondary | ICD-10-CM | POA: Diagnosis not present

## 2021-09-02 DIAGNOSIS — M1612 Unilateral primary osteoarthritis, left hip: Secondary | ICD-10-CM | POA: Diagnosis not present

## 2021-09-02 DIAGNOSIS — G4700 Insomnia, unspecified: Secondary | ICD-10-CM | POA: Diagnosis not present

## 2021-09-02 DIAGNOSIS — K219 Gastro-esophageal reflux disease without esophagitis: Secondary | ICD-10-CM | POA: Diagnosis not present

## 2021-09-02 DIAGNOSIS — E782 Mixed hyperlipidemia: Secondary | ICD-10-CM | POA: Diagnosis not present

## 2021-09-02 DIAGNOSIS — I10 Essential (primary) hypertension: Secondary | ICD-10-CM | POA: Diagnosis not present

## 2021-09-02 DIAGNOSIS — H269 Unspecified cataract: Secondary | ICD-10-CM | POA: Diagnosis not present

## 2021-09-02 DIAGNOSIS — D500 Iron deficiency anemia secondary to blood loss (chronic): Secondary | ICD-10-CM | POA: Diagnosis not present

## 2021-09-07 DIAGNOSIS — M4726 Other spondylosis with radiculopathy, lumbar region: Secondary | ICD-10-CM | POA: Diagnosis not present

## 2021-09-07 DIAGNOSIS — M79652 Pain in left thigh: Secondary | ICD-10-CM | POA: Diagnosis not present

## 2021-09-07 DIAGNOSIS — M545 Low back pain, unspecified: Secondary | ICD-10-CM | POA: Diagnosis not present

## 2021-09-07 DIAGNOSIS — M79651 Pain in right thigh: Secondary | ICD-10-CM | POA: Diagnosis not present

## 2021-09-14 DIAGNOSIS — M79652 Pain in left thigh: Secondary | ICD-10-CM | POA: Diagnosis not present

## 2021-09-14 DIAGNOSIS — M79651 Pain in right thigh: Secondary | ICD-10-CM | POA: Diagnosis not present

## 2021-09-14 DIAGNOSIS — M4726 Other spondylosis with radiculopathy, lumbar region: Secondary | ICD-10-CM | POA: Diagnosis not present

## 2021-09-14 DIAGNOSIS — M545 Low back pain, unspecified: Secondary | ICD-10-CM | POA: Diagnosis not present

## 2021-09-16 DIAGNOSIS — M4726 Other spondylosis with radiculopathy, lumbar region: Secondary | ICD-10-CM | POA: Diagnosis not present

## 2021-09-16 DIAGNOSIS — M79651 Pain in right thigh: Secondary | ICD-10-CM | POA: Diagnosis not present

## 2021-09-16 DIAGNOSIS — M79652 Pain in left thigh: Secondary | ICD-10-CM | POA: Diagnosis not present

## 2021-09-16 DIAGNOSIS — M545 Low back pain, unspecified: Secondary | ICD-10-CM | POA: Diagnosis not present

## 2021-09-21 DIAGNOSIS — M545 Low back pain, unspecified: Secondary | ICD-10-CM | POA: Diagnosis not present

## 2021-09-21 DIAGNOSIS — M4726 Other spondylosis with radiculopathy, lumbar region: Secondary | ICD-10-CM | POA: Diagnosis not present

## 2021-09-21 DIAGNOSIS — M79652 Pain in left thigh: Secondary | ICD-10-CM | POA: Diagnosis not present

## 2021-09-21 DIAGNOSIS — M79651 Pain in right thigh: Secondary | ICD-10-CM | POA: Diagnosis not present

## 2021-09-23 DIAGNOSIS — M4726 Other spondylosis with radiculopathy, lumbar region: Secondary | ICD-10-CM | POA: Diagnosis not present

## 2021-09-23 DIAGNOSIS — M79651 Pain in right thigh: Secondary | ICD-10-CM | POA: Diagnosis not present

## 2021-09-23 DIAGNOSIS — M79652 Pain in left thigh: Secondary | ICD-10-CM | POA: Diagnosis not present

## 2021-09-23 DIAGNOSIS — M545 Low back pain, unspecified: Secondary | ICD-10-CM | POA: Diagnosis not present

## 2021-09-28 DIAGNOSIS — M545 Low back pain, unspecified: Secondary | ICD-10-CM | POA: Diagnosis not present

## 2021-09-28 DIAGNOSIS — M79651 Pain in right thigh: Secondary | ICD-10-CM | POA: Diagnosis not present

## 2021-09-28 DIAGNOSIS — M4726 Other spondylosis with radiculopathy, lumbar region: Secondary | ICD-10-CM | POA: Diagnosis not present

## 2021-09-28 DIAGNOSIS — M79652 Pain in left thigh: Secondary | ICD-10-CM | POA: Diagnosis not present

## 2021-09-30 DIAGNOSIS — M545 Low back pain, unspecified: Secondary | ICD-10-CM | POA: Diagnosis not present

## 2021-09-30 DIAGNOSIS — M79651 Pain in right thigh: Secondary | ICD-10-CM | POA: Diagnosis not present

## 2021-09-30 DIAGNOSIS — M4726 Other spondylosis with radiculopathy, lumbar region: Secondary | ICD-10-CM | POA: Diagnosis not present

## 2021-09-30 DIAGNOSIS — M79652 Pain in left thigh: Secondary | ICD-10-CM | POA: Diagnosis not present

## 2021-10-04 DIAGNOSIS — M4726 Other spondylosis with radiculopathy, lumbar region: Secondary | ICD-10-CM | POA: Diagnosis not present

## 2021-10-04 DIAGNOSIS — M545 Low back pain, unspecified: Secondary | ICD-10-CM | POA: Diagnosis not present

## 2021-10-04 DIAGNOSIS — M79652 Pain in left thigh: Secondary | ICD-10-CM | POA: Diagnosis not present

## 2021-10-04 DIAGNOSIS — M79651 Pain in right thigh: Secondary | ICD-10-CM | POA: Diagnosis not present

## 2021-10-05 DIAGNOSIS — K5900 Constipation, unspecified: Secondary | ICD-10-CM | POA: Diagnosis not present

## 2021-10-05 DIAGNOSIS — I493 Ventricular premature depolarization: Secondary | ICD-10-CM | POA: Diagnosis not present

## 2021-10-05 DIAGNOSIS — R609 Edema, unspecified: Secondary | ICD-10-CM | POA: Diagnosis not present

## 2021-10-05 DIAGNOSIS — E782 Mixed hyperlipidemia: Secondary | ICD-10-CM | POA: Diagnosis not present

## 2021-10-05 DIAGNOSIS — K219 Gastro-esophageal reflux disease without esophagitis: Secondary | ICD-10-CM | POA: Diagnosis not present

## 2021-10-05 DIAGNOSIS — M542 Cervicalgia: Secondary | ICD-10-CM | POA: Diagnosis not present

## 2021-10-05 DIAGNOSIS — L219 Seborrheic dermatitis, unspecified: Secondary | ICD-10-CM | POA: Diagnosis not present

## 2021-10-05 DIAGNOSIS — M545 Low back pain, unspecified: Secondary | ICD-10-CM | POA: Diagnosis not present

## 2021-10-05 DIAGNOSIS — I10 Essential (primary) hypertension: Secondary | ICD-10-CM | POA: Diagnosis not present

## 2021-10-05 DIAGNOSIS — R0602 Shortness of breath: Secondary | ICD-10-CM | POA: Diagnosis not present

## 2021-10-05 DIAGNOSIS — I714 Abdominal aortic aneurysm, without rupture, unspecified: Secondary | ICD-10-CM | POA: Diagnosis not present

## 2021-10-10 ENCOUNTER — Other Ambulatory Visit: Payer: Self-pay

## 2021-10-10 ENCOUNTER — Other Ambulatory Visit: Payer: Medicare Other | Admitting: Hospice

## 2021-10-10 DIAGNOSIS — W19XXXD Unspecified fall, subsequent encounter: Secondary | ICD-10-CM

## 2021-10-10 DIAGNOSIS — Z515 Encounter for palliative care: Secondary | ICD-10-CM

## 2021-10-10 DIAGNOSIS — M545 Low back pain, unspecified: Secondary | ICD-10-CM

## 2021-10-10 DIAGNOSIS — J440 Chronic obstructive pulmonary disease with acute lower respiratory infection: Secondary | ICD-10-CM | POA: Diagnosis not present

## 2021-10-10 DIAGNOSIS — J209 Acute bronchitis, unspecified: Secondary | ICD-10-CM | POA: Diagnosis not present

## 2021-10-10 NOTE — Progress Notes (Signed)
[facility] Consult Note Telephone: [phone]  Fax: [phone]  PATIENT NAME: [name] DOB: June 14, 1946 MRN: [id number]  PRIMARY CARE PROVIDER:   Maury Dus, MD Mark Zamora, [place],  [place] [address]  REFERRING PROVIDER: Maury Dus, MD Mark Dus, MD [place],  [place] [address]  RESPONSIBLE PARTY: Self [place]     Name Relation Home Work Mobile   [facility] Daughter [phone]  [phone]   Zamora, Mark [phone]  [phone]      TELEHEALTH VISIT STATEMENT Due to the COVID-19 crisis, this visit was done via telemedicine- audio visual from my office and it was initiated and consent by this patient and or family. I connected with patient via his telephone audio visual call and verified that I am speaking with the correct person. I discussed the limitations of evaluation and management by telemedicine. The patient expressed understanding and agreed to proceed.  Visit is to build trust and highlight Palliative Medicine as specialized medical care for people living with serious illness, aimed at facilitating better quality of life through symptoms relief, assisting with advance care planning and complex medical decision making. This is a follow up visit.  RECOMMENDATIONS/PLAN:   Advance Care Planning/Code Status: Patient is a DO NOT RESUSCITATE  Goals of Care: Goals of care include to maximize quality of life and symptom management. Most selections include limited additional intervention, feeding tube for trial period time, IV fluids as indicated, antibiotics as indicated.    Visit consisted of counseling and education dealing with the complex and emotionally intense issues of symptom management and palliative care in the setting of serious and potentially life-threatening illness. Palliative care team will  continue to support patient, patient's family, and medical team.  Symptom management/Plan:  Low back pain:  following a fall. PT/OT is ongoing.Tramadol and Tylenol in use, not effective. Use of heating pad discussed in addition to medications. Plan is see Ortho for further evaluation and possible steroid shots.  Fall: recent fall, while his dog pulled at him while going for a walk.  Fall resulted in ongoing low back pain.  PT/OT is ongoing for gait training.  COPD: no recent exacerbation. Continue Albuterol and Symbicort as ordered. Continue CPAP at night.  Discussion on avoiding triggers, pursed lip breathing and adherence to breathing treatments.    Follow up: Palliative care will continue to follow for complex medical decision making, advance care planning, and clarification of goals. Return 6 weeks or prn. Encouraged to call provider sooner with any concerns.  CHIEF COMPLAINT: Palliative follow up  HISTORY OF PRESENT ILLNESS:  Mark Zamora a 75 y.o. male with multiple medical problems including acute low back pain, aching, radiating, moderate pain following a fall while walking his dog.  Pain impairs his activities of daily living worsened when he tries to get out of bed; he reports some relief from use of tramadol and Tylenol.  Appointment to see Ortho for further evaluation and possible steroid shots.  Patient was premedicated before visit, denies pain, in no respiratory distress, no fever/chills.  History of osteoarthritis of hip/right knee, insomnia, depression in remission, hypertension, COPD. History obtained from review of EMR, discussion with primary team, family and/or patient. Records reviewed and summarized above. All 10 point systems reviewed and are negative except as documented in history of present illness above  Review and summarization of Epic records shows history from other than  patient.   Palliative Care was asked to follow this patient o help address complex decision  making in the context of advance care planning and goals of care clarification. I reviewed, as needed, available labs, patient records, imaging, studies and related documents from the EMR.    PHYSICAL EXAM  General: appropriately dressed, cooperative Pulmonary: no cough, no increased work of breathing, normal respiratory effort Eyes: Normal lids, no discharge Skin: no rash to visible skin, warm without cyanosis,  Psych: non-anxious affect Neurological: Weakness but otherwise non focal Heme/lymph/immuno: no bruises, no bleeding  PERTINENT MEDICATIONS:  Outpatient Encounter Medications as of 10/10/2021  Medication Sig   acetaminophen (TYLENOL) 650 MG CR tablet Take 1,300 mg by mouth every 8 (eight) hours as needed for pain.   amLODipine (NORVASC) 10 MG tablet Take 10 mg by mouth every morning.    doxazosin (CARDURA) 8 MG tablet Take 8 mg by mouth at bedtime.   fluticasone (FLONASE) 50 MCG/ACT nasal spray Place 2 sprays into both nostrils at bedtime.    lisinopril (PRINIVIL,ZESTRIL) 40 MG tablet Take 40 mg by mouth daily.   meclizine (ANTIVERT) 12.5 MG tablet Take 1 tablet (12.5 mg total) by mouth 3 (three) times daily as needed for dizziness. (Patient not taking: Reported on 11/23/2017)   omeprazole (PRILOSEC) 20 MG capsule Take 20 mg by mouth every morning.    oxyCODONE (OXY IR/ROXICODONE) 5 MG immediate release tablet Take 1-2 tablets (5-10 mg total) by mouth every 4 (four) hours as needed for moderate pain or severe pain.   potassium chloride SA (K-DUR,KLOR-CON) 20 MEQ tablet Take 20 mEq by mouth daily.    pravastatin (PRAVACHOL) 80 MG tablet Take 80 mg by mouth every evening.    rivaroxaban (XARELTO) 10 MG TABS tablet Take 1 tablet (10 mg total) by mouth daily with breakfast. Take Xarelto for two and a half more weeks following discharge from the hospital, then discontinue Xarelto. Once the patient has completed the Xarelto, they may resume the 325 mg Aspirin.   spironolactone (ALDACTONE)  25 MG tablet Take 25 mg by mouth daily.   tiZANidine (ZANAFLEX) 4 MG capsule Take 1 capsule (4 mg total) by mouth 3 (three) times daily.   traMADol (ULTRAM) 50 MG tablet Take 1-2 tablets (50-100 mg total) by mouth every 6 (six) hours as needed (mild pain).   zolpidem (AMBIEN CR) 12.5 MG CR tablet Take 12.5 mg by mouth at bedtime.   No facility-administered encounter medications on file as of 10/10/2021.    HOSPICE ELIGIBILITY/DIAGNOSIS: TBD  PAST MEDICAL HISTORY:  Past Medical History:  Diagnosis Date   AAA (abdominal aortic aneurysm) ([facility]) 01/29/2017   2.9cm   Allergic rhinitis    Arthritis    l ankle   BPH (benign prostatic hypertrophy)    Cancer (HCC)    HX SKIN CANCER   Cataract, bilateral    Chronic back pain    Chronic left hip pain    Constipation    Depression    Dyspnea    with exertion   Essential hypertension    GERD (gastroesophageal reflux disease)    History of chronic sinusitis    History of dizziness 03/2016   History of skin cancer    Hyperlipidemia    Hypokalemia 03/2016   Insomnia    Neck pain, chronic    Nocturia    OSA on CPAP    uses cpap   Pedal edema    Chronic   Postherpetic neuralgia    Shingles  ALLERGIES:  Allergies  Allergen Reactions   Compazine [Prochlorperazine Edisylate] Anaphylaxis and Other (See Comments)    Pt states "any nausea med ending in "zine"    Cymbalta [Duloxetine Hcl] Other (See Comments)    Urinary retention    Promethazine Anaphylaxis and Other (See Comments)    seizures   Codeine Other (See Comments)    constipation   Other Other (See Comments)    All antiemetic meds ending -zine  Prefers to not take any narcotic pain meds     Thank you for the opportunity to participate in the care of Mark Zamora Please call our office at [phone] if we can be of additional assistance.  Note: Portions of this note were generated with Lobbyist. Dictation errors may occur despite best  attempts at proofreading.  Teodoro Spray, NP

## 2021-10-18 DIAGNOSIS — M5459 Other low back pain: Secondary | ICD-10-CM | POA: Diagnosis not present

## 2021-10-18 DIAGNOSIS — I700 Atherosclerosis of aorta: Secondary | ICD-10-CM | POA: Diagnosis not present

## 2021-10-18 DIAGNOSIS — E785 Hyperlipidemia, unspecified: Secondary | ICD-10-CM | POA: Diagnosis not present

## 2021-10-18 DIAGNOSIS — M5416 Radiculopathy, lumbar region: Secondary | ICD-10-CM | POA: Diagnosis not present

## 2021-11-02 DIAGNOSIS — M5459 Other low back pain: Secondary | ICD-10-CM | POA: Diagnosis not present

## 2021-11-12 DIAGNOSIS — M5416 Radiculopathy, lumbar region: Secondary | ICD-10-CM | POA: Diagnosis not present

## 2021-12-15 DIAGNOSIS — M5416 Radiculopathy, lumbar region: Secondary | ICD-10-CM | POA: Diagnosis not present

## 2021-12-15 DIAGNOSIS — M48062 Spinal stenosis, lumbar region with neurogenic claudication: Secondary | ICD-10-CM | POA: Diagnosis not present

## 2021-12-27 DIAGNOSIS — M5416 Radiculopathy, lumbar region: Secondary | ICD-10-CM | POA: Diagnosis not present

## 2022-01-13 DIAGNOSIS — E782 Mixed hyperlipidemia: Secondary | ICD-10-CM | POA: Diagnosis not present

## 2022-01-13 DIAGNOSIS — I10 Essential (primary) hypertension: Secondary | ICD-10-CM | POA: Diagnosis not present

## 2022-01-25 ENCOUNTER — Telehealth: Payer: Self-pay | Admitting: *Deleted

## 2022-01-25 NOTE — Telephone Encounter (Signed)
Call made to follow up on patient's overall condition. Left a voicemail providing contact information for a return call.  ?

## 2022-01-30 DIAGNOSIS — M5416 Radiculopathy, lumbar region: Secondary | ICD-10-CM | POA: Diagnosis not present

## 2022-02-03 DIAGNOSIS — R3915 Urgency of urination: Secondary | ICD-10-CM | POA: Diagnosis not present

## 2022-02-13 ENCOUNTER — Other Ambulatory Visit: Payer: Self-pay | Admitting: Family Medicine

## 2022-02-13 DIAGNOSIS — I77819 Aortic ectasia, unspecified site: Secondary | ICD-10-CM

## 2022-02-22 ENCOUNTER — Ambulatory Visit
Admission: RE | Admit: 2022-02-22 | Discharge: 2022-02-22 | Disposition: A | Discharge status: Home / Self Care | Payer: Medicare Other | Source: Ambulatory Visit | Attending: Family Medicine | Admitting: Family Medicine

## 2022-02-22 DIAGNOSIS — I77811 Abdominal aortic ectasia: Secondary | ICD-10-CM | POA: Diagnosis not present

## 2022-02-22 DIAGNOSIS — I77819 Aortic ectasia, unspecified site: Secondary | ICD-10-CM

## 2022-03-22 DIAGNOSIS — L821 Other seborrheic keratosis: Secondary | ICD-10-CM | POA: Diagnosis not present

## 2022-03-22 DIAGNOSIS — Z08 Encounter for follow-up examination after completed treatment for malignant neoplasm: Secondary | ICD-10-CM | POA: Diagnosis not present

## 2022-03-22 DIAGNOSIS — D225 Melanocytic nevi of trunk: Secondary | ICD-10-CM | POA: Diagnosis not present

## 2022-03-22 DIAGNOSIS — Z85820 Personal history of malignant melanoma of skin: Secondary | ICD-10-CM | POA: Diagnosis not present

## 2022-04-10 DIAGNOSIS — E782 Mixed hyperlipidemia: Secondary | ICD-10-CM | POA: Diagnosis not present

## 2022-04-10 DIAGNOSIS — Z0000 Encounter for general adult medical examination without abnormal findings: Secondary | ICD-10-CM | POA: Diagnosis not present

## 2022-04-10 DIAGNOSIS — J449 Chronic obstructive pulmonary disease, unspecified: Secondary | ICD-10-CM | POA: Diagnosis not present

## 2022-04-10 DIAGNOSIS — I493 Ventricular premature depolarization: Secondary | ICD-10-CM | POA: Diagnosis not present

## 2022-04-10 DIAGNOSIS — I714 Abdominal aortic aneurysm, without rupture, unspecified: Secondary | ICD-10-CM | POA: Diagnosis not present

## 2022-04-10 DIAGNOSIS — R011 Cardiac murmur, unspecified: Secondary | ICD-10-CM | POA: Diagnosis not present

## 2022-04-10 DIAGNOSIS — I10 Essential (primary) hypertension: Secondary | ICD-10-CM | POA: Diagnosis not present

## 2022-04-10 DIAGNOSIS — G4700 Insomnia, unspecified: Secondary | ICD-10-CM | POA: Diagnosis not present

## 2022-04-10 DIAGNOSIS — R609 Edema, unspecified: Secondary | ICD-10-CM | POA: Diagnosis not present

## 2022-04-21 DIAGNOSIS — R011 Cardiac murmur, unspecified: Secondary | ICD-10-CM | POA: Diagnosis not present

## 2022-05-01 DIAGNOSIS — M5416 Radiculopathy, lumbar region: Secondary | ICD-10-CM | POA: Diagnosis not present

## 2022-05-02 ENCOUNTER — Telehealth: Payer: Self-pay

## 2022-05-02 NOTE — Telephone Encounter (Signed)
NOTES SCANNED TO REFERRAL 

## 2022-05-11 ENCOUNTER — Other Ambulatory Visit: Payer: Medicare Other | Admitting: Hospice

## 2022-05-11 DIAGNOSIS — M545 Low back pain, unspecified: Secondary | ICD-10-CM

## 2022-05-11 DIAGNOSIS — J209 Acute bronchitis, unspecified: Secondary | ICD-10-CM | POA: Diagnosis not present

## 2022-05-11 DIAGNOSIS — G8929 Other chronic pain: Secondary | ICD-10-CM | POA: Diagnosis not present

## 2022-05-11 DIAGNOSIS — Z515 Encounter for palliative care: Secondary | ICD-10-CM

## 2022-05-11 DIAGNOSIS — J440 Chronic obstructive pulmonary disease with acute lower respiratory infection: Secondary | ICD-10-CM | POA: Diagnosis not present

## 2022-05-11 NOTE — Progress Notes (Signed)
[facility] Consult Note Telephone: [phone]  Fax: [phone]  PATIENT NAME: Mark Zamora DOB: [birth year] MRN: [id number]  PRIMARY CARE PROVIDER:   Maury Dus, MD Mark Zamora, [place],  [place] [address]  REFERRING PROVIDER: Maury Dus, MD Mark Dus, [place],  [place] [address]  RESPONSIBLE PARTY: Self [place]     Name Relation Home Work [place] Daughter [phone]  [phone]   Mark Zamora [phone]  [phone]       Visit is to build trust and highlight Palliative Medicine as specialized medical care for people living with serious illness, aimed at facilitating better quality of life through symptoms relief, assisting with advance care planning and complex medical decision making. This is a follow up visit.  RECOMMENDATIONS/PLAN:   Advance Care Planning/Code Status: Patient is a DO NOT RESUSCITATE  Goals of Care: Goals of care include to maximize quality of life and symptom management. Most selections include limited additional intervention, feeding tube for trial period time, IV fluids as indicated, antibiotics as indicated.    Visit consisted of counseling and education dealing with the complex and emotionally intense issues of symptom management and palliative care in the setting of serious and potentially life-threatening illness. Palliative care team will continue to support patient, patient's family, and medical team.  Symptom management/Plan:  Low back pain: chronic, related to osteoarthritis and spinal cord stenosis. Continue with Tramadol and Tylenol in use, continue steroid shots from Ortho every three months.  COPD: no recent recent exacerbation. Continue Albuterol and Symbicort as ordered. Continue CPAP at night. Avoid triggers. Slow deep breathing.  Aortic stenosis: Managed by  Cardiologist. Follow up with Dr Mark Zamora - Cardiologist as planned 05/22/2022. Follow up: Palliative care will continue to follow for complex medical decision making, advance care planning, and clarification of goals. Return 6 weeks or prn. Encouraged to call provider sooner with any concerns.  CHIEF COMPLAINT: Palliative follow up  HISTORY OF PRESENT ILLNESS:  Mark Zamora a 76 y.o. male with multiple morbidities requiring close monitoring/management with high risk of complications and morbidity: Osteoarthritis, spinal stenosis, aortic stenosis, insomnia, depression in remission, hypertension, COPD. History obtained from review of EMR, discussion with primary team, family and/or patient. Records reviewed and summarized above. All 10 point systems reviewed and are negative except as documented in history of present illness above  Review and summarization of Epic records shows history from other than patient.   Palliative Care was asked to follow this patient o help address complex decision making in the context of advance care planning and goals of care clarification. I reviewed, as needed, available labs, patient records, imaging, studies and related documents from the EMR.   PERTINENT MEDICATIONS:  Outpatient Encounter Medications as of 05/11/2022  Medication Sig   acetaminophen (TYLENOL) 650 MG CR tablet Take 1,300 mg by mouth every 8 (eight) hours as needed for pain.   amLODipine (NORVASC) 10 MG tablet Take 10 mg by mouth every morning.    doxazosin (CARDURA) 8 MG tablet Take 8 mg by mouth at bedtime.   fluticasone (FLONASE) 50 MCG/ACT nasal spray Place 2 sprays into both nostrils at bedtime.    lisinopril (PRINIVIL,ZESTRIL) 40 MG tablet Take 40 mg by mouth daily.   meclizine (ANTIVERT) 12.5 MG tablet Take 1 tablet (12.5 mg total) by mouth 3 (three) times daily as needed for dizziness. (Patient not taking:  Reported on 11/23/2017)   omeprazole (PRILOSEC) 20 MG capsule Take 20 mg by mouth  every morning.    oxyCODONE (OXY IR/ROXICODONE) 5 MG immediate release tablet Take 1-2 tablets (5-10 mg total) by mouth every 4 (four) hours as needed for moderate pain or severe pain.   potassium chloride SA (K-DUR,KLOR-CON) 20 MEQ tablet Take 20 mEq by mouth daily.    pravastatin (PRAVACHOL) 80 MG tablet Take 80 mg by mouth every evening.    rivaroxaban (XARELTO) 10 MG TABS tablet Take 1 tablet (10 mg total) by mouth daily with breakfast. Take Xarelto for two and a half more weeks following discharge from the hospital, then discontinue Xarelto. Once the patient has completed the Xarelto, they may resume the 325 mg Aspirin.   spironolactone (ALDACTONE) 25 MG tablet Take 25 mg by mouth daily.   tiZANidine (ZANAFLEX) 4 MG capsule Take 1 capsule (4 mg total) by mouth 3 (three) times daily.   traMADol (ULTRAM) 50 MG tablet Take 1-2 tablets (50-100 mg total) by mouth every 6 (six) hours as needed (mild pain).   zolpidem (AMBIEN CR) 12.5 MG CR tablet Take 12.5 mg by mouth at bedtime.   No facility-administered encounter medications on file as of 05/11/2022.    HOSPICE ELIGIBILITY/DIAGNOSIS: TBD  PAST MEDICAL HISTORY:  Past Medical History:  Diagnosis Date   AAA (abdominal aortic aneurysm) ([facility]) 01/29/2017   2.9cm   Allergic rhinitis    Arthritis    l ankle   BPH (benign prostatic hypertrophy)    Cancer (HCC)    HX SKIN CANCER   Cataract, bilateral    Chronic back pain    Chronic left hip pain    Constipation    Depression    Dyspnea    with exertion   Essential hypertension    GERD (gastroesophageal reflux disease)    History of chronic sinusitis    History of dizziness 03/2016   History of skin cancer    Hyperlipidemia    Hypokalemia 03/2016   Insomnia    Neck pain, chronic    Nocturia    OSA on CPAP    uses cpap   Pedal edema    Chronic   Postherpetic neuralgia    Shingles       ALLERGIES:  Allergies  Allergen Reactions   Compazine [Prochlorperazine Edisylate]  Anaphylaxis and Other (See Comments)    Pt states "any nausea med ending in "zine"    Cymbalta [Duloxetine Hcl] Other (See Comments)    Urinary retention    Promethazine Anaphylaxis and Other (See Comments)    seizures   Codeine Other (See Comments)    constipation   Other Other (See Comments)    All antiemetic meds ending -zine  Prefers to not take any narcotic pain meds    I spent 45 minutes providing this consultation; this includes time spent with patient/family, chart review and documentation. More than 50% of the time in this consultation was spent on counseling and coordinating communication  Thank you for the opportunity to participate in the care of BRITTEN SEYFRIED Please call our office at [phone] if we can be of additional assistance.  Note: Portions of this note were generated with Lobbyist. Dictation errors may occur despite best attempts at proofreading.  Teodoro Spray, NP

## 2022-05-15 ENCOUNTER — Other Ambulatory Visit: Payer: Self-pay

## 2022-05-15 DIAGNOSIS — I350 Nonrheumatic aortic (valve) stenosis: Secondary | ICD-10-CM

## 2022-05-17 ENCOUNTER — Ambulatory Visit (HOSPITAL_COMMUNITY): Payer: Medicare Other | Attending: Cardiology

## 2022-05-17 DIAGNOSIS — I350 Nonrheumatic aortic (valve) stenosis: Secondary | ICD-10-CM | POA: Insufficient documentation

## 2022-05-17 LAB — ECHOCARDIOGRAM COMPLETE
AR max vel: 0.67 cm2
AV Area VTI: 0.62 cm2
AV Area mean vel: 0.55 cm2
AV Mean grad: 38 mmHg
AV Peak grad: 59.9 mmHg
Ao pk vel: 3.87 m/s
Area-P 1/2: 2.99 cm2
S' Lateral: 3.7 cm

## 2022-05-22 ENCOUNTER — Ambulatory Visit: Payer: Medicare Other | Admitting: Internal Medicine

## 2022-05-22 ENCOUNTER — Encounter: Payer: Self-pay | Admitting: Internal Medicine

## 2022-05-22 VITALS — BP 132/80 | HR 56 | Ht 64.0 in | Wt 176.8 lb

## 2022-05-22 DIAGNOSIS — I10 Essential (primary) hypertension: Secondary | ICD-10-CM | POA: Diagnosis not present

## 2022-05-22 DIAGNOSIS — I350 Nonrheumatic aortic (valve) stenosis: Secondary | ICD-10-CM

## 2022-05-22 DIAGNOSIS — Z01812 Encounter for preprocedural laboratory examination: Secondary | ICD-10-CM | POA: Diagnosis not present

## 2022-05-22 DIAGNOSIS — J449 Chronic obstructive pulmonary disease, unspecified: Secondary | ICD-10-CM | POA: Diagnosis not present

## 2022-05-22 DIAGNOSIS — I700 Atherosclerosis of aorta: Secondary | ICD-10-CM

## 2022-05-22 LAB — CBC
Hematocrit: 39.4 % (ref 37.5–51.0)
Hemoglobin: 13.5 g/dL (ref 13.0–17.7)
MCH: 29.8 pg (ref 26.6–33.0)
MCHC: 34.3 g/dL (ref 31.5–35.7)
MCV: 87 fL (ref 79–97)
Platelets: 230 10*3/uL (ref 150–450)
RBC: 4.53 x10E6/uL (ref 4.14–5.80)
RDW: 14.3 % (ref 11.6–15.4)
WBC: 8.5 10*3/uL (ref 3.4–10.8)

## 2022-05-22 LAB — BASIC METABOLIC PANEL
BUN/Creatinine Ratio: 19 (ref 10–24)
BUN: 14 mg/dL (ref 8–27)
CO2: 25 mmol/L (ref 20–29)
Calcium: 9.8 mg/dL (ref 8.6–10.2)
Chloride: 104 mmol/L (ref 96–106)
Creatinine, Ser: 0.75 mg/dL — ABNORMAL LOW (ref 0.76–1.27)
Glucose: 96 mg/dL (ref 70–99)
Potassium: 3.9 mmol/L (ref 3.5–5.2)
Sodium: 138 mmol/L (ref 134–144)
eGFR: 94 mL/min/{1.73_m2} (ref >59)

## 2022-05-22 MED ORDER — ASPIRIN 81 MG PO TBEC: 81.0000 mg | DELAYED_RELEASE_TABLET | Freq: Every day | ORAL | 3 refills | Status: DC

## 2022-05-22 NOTE — H&P (View-Only) (Signed)
Patient ID: Mark Zamora MRN: [id number] DOB/AGE: 06/14/1946 76 y.o.  Primary Care Physician:Reade, Herbie [place], MD Primary Cardiologist: None   FOCUSED CARDIOVASCULAR PROBLEM LIST:   1.  Severe aortic stenosis with an aortic valve area of 0.16 cm, peak velocity of 3.8 m/s, mean gradient 38 mmHg with an ejection fraction of 60 to 65%; EKG today demonstrates sinus bradycardia without bundle-branch blocks 2.  COPD 3.  Spinal stenosis 4.  DNR followed by palliative care (this was a hold over from the pandemic and he is willing to revert to full code in the future) 5.  Osteoarthritis 6.  Spinal stenosis 7.  Frailty and ambulates with a cane  HISTORY OF PRESENT ILLNESS: The patient is a 76 y.o. male with the indicated medical history here for recommendations regarding his severe aortic stenosis.  The patient is a very pleasant elderly male here with his daughter.  He is the primary caregiver for his wife who is suffering from dementia.  He does all of his activities of daily living but has noticed over the last 6 months he has had increasing shortness of breath.  On occasion he will get lightheaded when going from sitting to standing.  He additionally occasionally develops chest discomfort.  He has required no emergency room visits or hospitalizations.  Over the last 6 months he is noticing increasing shortness of breath with walking around his house and walking his dog.  He has had to stop and rest on occasion.  He is never really had shortness of breath at rest.  All of this has interrupted his activities of daily living especially given the fact he has to do a lot of things to take care of his wife.  He wishes he were not as short as breath and could eat more easily handled these tasks.  He quit smoking many years ago.  He sees a Pharmacist, community on a regular basis and in fact saw one not too long ago.  He reports very good dental health.  Past Medical History:  Diagnosis Date   AAA (abdominal  aortic aneurysm) ([facility]) 01/29/2017   2.9cm   Allergic rhinitis    Arthritis    l ankle   BPH (benign prostatic hypertrophy)    Cancer (HCC)    HX SKIN CANCER   Cataract, bilateral    Chronic back pain    Chronic left hip pain    Constipation    Depression    Dyspnea    with exertion   Essential hypertension    GERD (gastroesophageal reflux disease)    History of chronic sinusitis    History of dizziness 03/2016   History of skin cancer    Hyperlipidemia    Hypokalemia 03/2016   Insomnia    Neck pain, chronic    Nocturia    OSA on CPAP    uses cpap   Pedal edema    Chronic   Postherpetic neuralgia    Shingles     Past Surgical History:  Procedure Laterality Date   ANKLE ARTHROSCOPY Left 03/ 2012   CATARACT EXTRACTION, BILATERAL     COLONOSCOPY     KNEE ARTHROSCOPY Left 04-19-2011   KNEE ARTHROSCOPY WITH MEDIAL MENISECTOMY Right 05/18/2015   Procedure: RIGHT KNEE ARTHROSCOPY WITH  MEDIAL MENISCAL DEBRIDEMENT;  Surgeon: Gaynelle Arabian, MD;  Location: [facility];  Service: Orthopedics;  Laterality: Right;   pilonidal fistulectomy     TOTAL HIP ARTHROPLASTY Left 12/12/2017   Procedure: LEFT TOTAL  HIP ARTHROPLASTY ANTERIOR APPROACH;  Surgeon: Gaynelle Arabian, MD;  Location: [facility];  Service: Orthopedics;  Laterality: Left;   TRANSURETHRAL RESECTION OF PROSTATE     TRANSURETHRAL RESECTION OF PROSTATE N/A 06/25/2015   Procedure: TRANSURETHRAL RESECTION OF THE PROSTATE WITH GYRUS INSTRUMENTS BLADDER BIOPSY AND FULGURATION;  Surgeon: Cleon Gustin, MD;  Location: [facility];  Service: Urology;  Laterality: N/A;    Family History  Problem Relation Age of Onset   Hypercholesterolemia Mother    Stroke Mother        diagnosed w/DM,CVD   CVA Father    Seizures Father     Social History   Socioeconomic History   Marital status: Married    Spouse name: Not on file   Number of children: Not on file   Years of education: Not on file   Highest education level: Not  on file  Occupational History   Not on file  Tobacco Use   Smoking status: Former    Types: Cigarettes    Quit date: 05/10/1994    Years since quitting: 28.0   Smokeless tobacco: Never  Vaping Use   Vaping Use: Never used  Substance and Sexual Activity   Alcohol use: Yes    Alcohol/week: 3.0 standard drinks of alcohol    Types: 3 Shots of liquor per week    Comment: 7 DRINKS PER WEEK   Drug use: No   Sexual activity: Not on file  Other Topics Concern   Not on file  Social History Narrative   Not on file   Social Determinants of Health   Financial Resource Strain: Not on file  Food Insecurity: Not on file  Transportation Needs: Not on file  Physical Activity: Not on file  Stress: Not on file  Social Connections: Not on file  Intimate Partner Violence: Not on file     Prior to Admission medications   Medication Sig Start Date End Date Taking? Authorizing Provider  acetaminophen (TYLENOL) 650 MG CR tablet Take 1,300 mg by mouth every 8 (eight) hours as needed for pain.    [provider]  amLODipine (NORVASC) 10 MG tablet Take 10 mg by mouth every morning.     [provider]  doxazosin (CARDURA) 8 MG tablet Take 8 mg by mouth at bedtime.    [provider]  fluticasone (FLONASE) 50 MCG/ACT nasal spray Place 2 sprays into both nostrils at bedtime.     [provider]  lisinopril (PRINIVIL,ZESTRIL) 40 MG tablet Take 40 mg by mouth daily.    [provider]  meclizine (ANTIVERT) 12.5 MG tablet Take 1 tablet (12.5 mg total) by mouth 3 (three) times daily as needed for dizziness. Patient not taking: Reported on 11/23/2017 04/01/16   Lajean Saver, MD  omeprazole (PRILOSEC) 20 MG capsule Take 20 mg by mouth every morning.     [provider]  oxyCODONE (OXY IR/ROXICODONE) 5 MG immediate release tablet Take 1-2 tablets (5-10 mg total) by mouth every 4 (four) hours as needed for moderate pain or severe pain. 12/13/17   Perkins,  Alexzandrew L, PA-C  potassium chloride SA (K-DUR,KLOR-CON) 20 MEQ tablet Take 20 mEq by mouth daily.     [provider]  pravastatin (PRAVACHOL) 80 MG tablet Take 80 mg by mouth every evening.     [provider]  rivaroxaban (XARELTO) 10 MG TABS tablet Take 1 tablet (10 mg total) by mouth daily with breakfast. Take Xarelto for two and a half more weeks  following discharge from the hospital, then discontinue Xarelto. Once the patient has completed the Xarelto, they may resume the 325 mg Aspirin. 12/13/17   Perkins, Alexzandrew L, PA-C  spironolactone (ALDACTONE) 25 MG tablet Take 25 mg by mouth daily.    [provider]  tiZANidine (ZANAFLEX) 4 MG capsule Take 1 capsule (4 mg total) by mouth 3 (three) times daily. 12/13/17   Perkins, Alexzandrew L, PA-C  traMADol (ULTRAM) 50 MG tablet Take 1-2 tablets (50-100 mg total) by mouth every 6 (six) hours as needed (mild pain). 12/13/17   Perkins, Alexzandrew L, PA-C  zolpidem (AMBIEN CR) 12.5 MG CR tablet Take 12.5 mg by mouth at bedtime.    [provider]    Allergies  Allergen Reactions   Compazine [Prochlorperazine Edisylate] Anaphylaxis and Other (See Comments)    Pt states "any nausea med ending in "zine"    Cymbalta [Duloxetine Hcl] Other (See Comments)    Urinary retention    Promethazine Anaphylaxis and Other (See Comments)    seizures   Codeine Other (See Comments)    constipation   Other Other (See Comments)    All antiemetic meds ending -zine  Prefers to not take any narcotic pain meds    REVIEW OF SYSTEMS:  General: no fevers/chills/night sweats Eyes: no blurry vision, diplopia, or amaurosis ENT: no sore throat or hearing loss Resp: no cough, wheezing, or hemoptysis CV: no edema or palpitations GI: no abdominal pain, nausea, vomiting, diarrhea, or constipation GU: no dysuria, frequency, or hematuria Skin: no rash Neuro: no headache, numbness, tingling, or weakness of  extremities Musculoskeletal: no joint pain or swelling Heme: no bleeding, DVT, or easy bruising Endo: no polydipsia or polyuria  BP 132/80   Pulse (!) 56   Ht '5\' 4"'$  (1.626 m)   Wt 176 lb 12.8 oz (80.2 kg)   SpO2 98%   BMI 30.35 kg/m   PHYSICAL EXAM: GEN:  AO x 3 in no acute distress HEENT: normal Dentition: Normal Neck: JVP normal. +1 carotid upstrokes without bruits but with radiation of aortic stenosis murmur. No thyromegaly. Lungs: equal expansion, clear bilaterally CV: Apex is discrete and nondisplaced, RRR with 3 out of 6 systolic ejection murmur  Abd: soft, non-tender, non-distended; no bruit; positive bowel sounds Ext: no edema, ecchymoses, or cyanosis Vascular: 2+ femoral pulses, 2+ radial pulses       Skin: warm and dry without rash Neuro: CN II-XII grossly intact; motor and sensory grossly intact    DATA AND STUDIES:  EKG: Performed today which I reviewed demonstrates sinus bradycardia with nonspecific ST and T wave changes no bundle-branch blocks.  2D ECHO: Severe aortic stenosis with aortic valve area of 0.67 square, mean gradient of 30 mmHg, and peak velocity 3.8 m/s with an ejection fraction of 55 to 60% and no significant other valvular abnormalities  CARDIAC CATH: n/a  STS RISK CALCULATOR: Pending  NHYA CLASS: 2  Outside hospital labs from May 2023 showed sodium 142, potassium 3.6, BUN 16, creatinine 0.7, LFTs within normal limits, hemoglobin 13.2, platelets 209  Abdominal ultrasound 2023 demonstrates aortic atherosclerosis and mild ectasia of proximal abdominal aorta without aneurysm  ASSESSMENT AND PLAN:   Aortic valve stenosis, etiology of cardiac valve disease unspecified - Plan: EKG 12-Lead  Chronic obstructive pulmonary disease, unspecified COPD type (HCC)  Aortic atherosclerosis ([facility])  Pre-procedure lab exam - Plan: EKG 52-DPOE, Basic metabolic panel, CBC  The patient has developed severe symptomatic aortic stenosis stage D1.  While he does  have COPD this has been longstanding and I think his most recent functional decline is likely due to progression of his aortic stenosis.  He is frail and does have osteoarthritis and spinal stenosis but seems to have a relatively good functional capacity.  He reports very good dental health.  Given that he is at the primary care giver of his wife I believe an aortic valve intervention and likely a TAVR procedure would be his best option.  We will reserve final recommendations regarding aortic valve intervention type until he is seen cardiothoracic surgery.  Our refer him for right heart catheterization and coronary angiography study as well as a CT scan.  Further recommendations will follow the results of this testing.  I did again inquire about the patient's CODE STATUS and he is willing to revert to full code and tells me that nodes characterizing him as DNR or based on discussions he had during the [employer] pandemic.  I have personally reviewed the patients imaging data as summarized above.  I have reviewed the natural history of aortic stenosis with the patient and family members who are present today. We have discussed the limitations of medical therapy and the poor prognosis associated with symptomatic aortic stenosis. We have also reviewed potential treatment options, including palliative medical therapy, conventional surgical aortic valve replacement, and transcatheter aortic valve replacement. We discussed treatment options in the context of this patient's specific comorbid medical conditions.   All of the patient's questions were answered today. Will make further recommendations based on the results of studies outlined above.   Total time spent with patient today 60 minutes. This includes reviewing records, evaluating the patient and coordinating care.   Early Osmond, MD  05/22/2022 11:53 AM    [facility] [place], [place],   [address] Phone: [phone];  Fax: [phone]

## 2022-05-22 NOTE — Progress Notes (Addendum)
Pre Surgical Assessment: 5 M Walk Test  46M=16.48f  5 Meter Walk Test- trial 1: 9.32 seconds 5 Meter Walk Test- trial 2: 7.81 seconds 5 Meter Walk Test- trial 3: 7.83 seconds 5 Meter Walk Test Average: 8.32 seconds  Procedure Type: Isolated AVR  Operative Mortality 3.59% Morbidity & Mortality 10.7% Stroke 0.871% Renal Failure 1.97% Reoperation 5.65% Prolonged Ventilation 4.98% Deep Sternal Wound Infection 0.094% [place] [place] [facility] 5.8% Short Hospital Stay (<6 days)* 42.6%

## 2022-05-25 ENCOUNTER — Telehealth (HOSPITAL_COMMUNITY): Payer: Self-pay | Admitting: Licensed Clinical Social Worker
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
## 2022-05-26 ENCOUNTER — Other Ambulatory Visit: Payer: Self-pay

## 2022-05-26 DIAGNOSIS — I350 Nonrheumatic aortic (valve) stenosis: Secondary | ICD-10-CM

## 2022-05-29 ENCOUNTER — Telehealth: Payer: Self-pay | Admitting: *Deleted

## 2022-05-29 NOTE — Telephone Encounter (Signed)
Cardiac Catheterization scheduled at [facility] for: Wednesday May 31, 2022 8:30 AM Arrival time and place: [facility] Entrance A at: 6:30 AM   Nothing to eat after midnight prior to procedure, clear liquids until 5 AM day of procedure.  Medication instructions: -Hold:  Spironolactone/KCl-AM of procedure  -Except hold medications usual morning medications can be taken with sips of water including aspirin 81 mg.  Confirmed patient has responsible adult to drive home post procedure and be with patient first 24 hours after arriving home.  Patient reports no new symptoms concerning for COVID-19 in the past 10 days.  Reviewed procedure instructions  patient.

## 2022-05-31 ENCOUNTER — Encounter (HOSPITAL_COMMUNITY)
Admission: RE | Disposition: A | Discharge status: Home / Self Care | Payer: Self-pay | Source: Home / Self Care | Attending: Internal Medicine

## 2022-05-31 ENCOUNTER — Ambulatory Visit (HOSPITAL_COMMUNITY)
Admission: RE | Admit: 2022-05-31 | Discharge: 2022-05-31 | Disposition: A | Discharge status: Home / Self Care | Payer: Medicare Other | Attending: Internal Medicine | Admitting: Internal Medicine

## 2022-05-31 ENCOUNTER — Other Ambulatory Visit: Payer: Self-pay

## 2022-05-31 DIAGNOSIS — Z66 Do not resuscitate: Secondary | ICD-10-CM | POA: Diagnosis not present

## 2022-05-31 DIAGNOSIS — M1990 Unspecified osteoarthritis, unspecified site: Secondary | ICD-10-CM | POA: Insufficient documentation

## 2022-05-31 DIAGNOSIS — R54 Age-related physical debility: Secondary | ICD-10-CM | POA: Insufficient documentation

## 2022-05-31 DIAGNOSIS — Z87891 Personal history of nicotine dependence: Secondary | ICD-10-CM | POA: Insufficient documentation

## 2022-05-31 DIAGNOSIS — J449 Chronic obstructive pulmonary disease, unspecified: Secondary | ICD-10-CM | POA: Insufficient documentation

## 2022-05-31 DIAGNOSIS — M4800 Spinal stenosis, site unspecified: Secondary | ICD-10-CM | POA: Diagnosis not present

## 2022-05-31 DIAGNOSIS — I350 Nonrheumatic aortic (valve) stenosis: Secondary | ICD-10-CM | POA: Diagnosis not present

## 2022-05-31 DIAGNOSIS — I700 Atherosclerosis of aorta: Secondary | ICD-10-CM | POA: Insufficient documentation

## 2022-05-31 DIAGNOSIS — I2510 Atherosclerotic heart disease of native coronary artery without angina pectoris: Secondary | ICD-10-CM | POA: Diagnosis not present

## 2022-05-31 HISTORY — PX: RIGHT HEART CATH AND CORONARY ANGIOGRAPHY: CATH118264

## 2022-05-31 HISTORY — PX: ABDOMINAL AORTOGRAM: CATH118222

## 2022-05-31 LAB — POCT I-STAT 7, (LYTES, BLD GAS, ICA,H+H)
Acid-Base Excess: 1 mmol/L (ref 0.0–2.0)
Bicarbonate: 27.1 mmol/L (ref 20.0–28.0)
Calcium, Ion: 1.24 mmol/L (ref 1.15–1.40)
HCT: 39 % (ref 39.0–52.0)
Hemoglobin: 13.3 g/dL (ref 13.0–17.0)
O2 Saturation: 93 %
Potassium: 3.2 mmol/L — ABNORMAL LOW (ref 3.5–5.1)
Sodium: 144 mmol/L (ref 135–145)
TCO2: 29 mmol/L (ref 22–32)
pCO2 arterial: 47.3 mmHg (ref 32–48)
pH, Arterial: 7.366 (ref 7.35–7.45)
pO2, Arterial: 71 mmHg — ABNORMAL LOW (ref 83–108)

## 2022-05-31 LAB — POCT I-STAT EG7
Acid-Base Excess: 1 mmol/L (ref 0.0–2.0)
Acid-Base Excess: 1 mmol/L (ref 0.0–2.0)
Bicarbonate: 27.7 mmol/L (ref 20.0–28.0)
Bicarbonate: 27.8 mmol/L (ref 20.0–28.0)
Calcium, Ion: 1.21 mmol/L (ref 1.15–1.40)
Calcium, Ion: 1.22 mmol/L (ref 1.15–1.40)
HCT: 38 % — ABNORMAL LOW (ref 39.0–52.0)
HCT: 39 % (ref 39.0–52.0)
Hemoglobin: 12.9 g/dL — ABNORMAL LOW (ref 13.0–17.0)
Hemoglobin: 13.3 g/dL (ref 13.0–17.0)
O2 Saturation: 69 %
O2 Saturation: 72 %
Potassium: 3.1 mmol/L — ABNORMAL LOW (ref 3.5–5.1)
Potassium: 3.2 mmol/L — ABNORMAL LOW (ref 3.5–5.1)
Sodium: 143 mmol/L (ref 135–145)
Sodium: 144 mmol/L (ref 135–145)
TCO2: 29 mmol/L (ref 22–32)
TCO2: 29 mmol/L (ref 22–32)
pCO2, Ven: 49.7 mmHg (ref 44–60)
pCO2, Ven: 50.2 mmHg (ref 44–60)
pH, Ven: 7.35 (ref 7.25–7.43)
pH, Ven: 7.355 (ref 7.25–7.43)
pO2, Ven: 38 mmHg (ref 32–45)
pO2, Ven: 40 mmHg (ref 32–45)

## 2022-05-31 SURGERY — RIGHT HEART CATH AND CORONARY ANGIOGRAPHY
Anesthesia: LOCAL

## 2022-05-31 MED ORDER — FENTANYL CITRATE (PF) 100 MCG/2ML IJ SOLN
INTRAMUSCULAR | Status: AC
  Filled 2022-05-31: qty 2

## 2022-05-31 MED ORDER — LIDOCAINE HCL (PF) 1 % IJ SOLN
INTRAMUSCULAR | Status: AC
Start: 2022-05-31 — End: ?
  Filled 2022-05-31: qty 30

## 2022-05-31 MED ORDER — SODIUM CHLORIDE 0.9% FLUSH
3.0000 mL | INTRAVENOUS | Status: DC | PRN
Start: 2022-05-31 — End: 2022-05-31

## 2022-05-31 MED ORDER — HEPARIN (PORCINE) IN NACL 1000-0.9 UT/500ML-% IV SOLN
INTRAVENOUS | Status: AC
  Filled 2022-05-31: qty 500

## 2022-05-31 MED ORDER — SODIUM CHLORIDE 0.9% FLUSH: 3.0000 mL | Freq: Two times a day (BID) | INTRAVENOUS | Status: DC

## 2022-05-31 MED ORDER — VERAPAMIL HCL 2.5 MG/ML IV SOLN
INTRAVENOUS | Status: DC | PRN
  Administered 2022-05-31: 10 mL via INTRA_ARTERIAL

## 2022-05-31 MED ORDER — HEPARIN SODIUM (PORCINE) 1000 UNIT/ML IJ SOLN
INTRAMUSCULAR | Status: AC
Start: 2022-05-31 — End: ?
  Filled 2022-05-31: qty 10

## 2022-05-31 MED ORDER — MIDAZOLAM HCL 2 MG/2ML IJ SOLN
INTRAMUSCULAR | Status: DC | PRN
  Administered 2022-05-31 (×2): 1 mg via INTRAVENOUS

## 2022-05-31 MED ORDER — IOHEXOL 350 MG/ML SOLN
INTRAVENOUS | Status: DC | PRN
  Administered 2022-05-31: 85 mL

## 2022-05-31 MED ORDER — LABETALOL HCL 5 MG/ML IV SOLN: 10.0000 mg | INTRAVENOUS | Status: DC | PRN

## 2022-05-31 MED ORDER — SODIUM CHLORIDE 0.9 % IV SOLN: 250.0000 mL | INTRAVENOUS | Status: DC | PRN

## 2022-05-31 MED ORDER — SODIUM CHLORIDE 0.9 % WEIGHT BASED INFUSION
1.0000 mL/kg/h | INTRAVENOUS | Status: DC
Start: 2022-05-31 — End: 2022-05-31

## 2022-05-31 MED ORDER — SODIUM CHLORIDE 0.9 % IV SOLN
250.0000 mL | INTRAVENOUS | Status: DC | PRN
Start: 2022-05-31 — End: 2022-05-31

## 2022-05-31 MED ORDER — ACETAMINOPHEN 325 MG PO TABS: 650.0000 mg | ORAL_TABLET | ORAL | Status: DC | PRN

## 2022-05-31 MED ORDER — VERAPAMIL HCL 2.5 MG/ML IV SOLN
INTRAVENOUS | Status: AC
  Filled 2022-05-31: qty 2

## 2022-05-31 MED ORDER — ASPIRIN 81 MG PO CHEW: 81.0000 mg | CHEWABLE_TABLET | ORAL | Status: DC

## 2022-05-31 MED ORDER — SODIUM CHLORIDE 0.9 % IV SOLN
INTRAVENOUS | Status: DC
Start: 2022-05-31 — End: 2022-05-31

## 2022-05-31 MED ORDER — HYDRALAZINE HCL 20 MG/ML IJ SOLN: 10.0000 mg | INTRAMUSCULAR | Status: DC | PRN

## 2022-05-31 MED ORDER — HEPARIN (PORCINE) IN NACL 1000-0.9 UT/500ML-% IV SOLN
INTRAVENOUS | Status: DC | PRN
  Administered 2022-05-31 (×2): 500 mL

## 2022-05-31 MED ORDER — ONDANSETRON HCL 4 MG/2ML IJ SOLN: 4.0000 mg | Freq: Four times a day (QID) | INTRAMUSCULAR | Status: DC | PRN

## 2022-05-31 MED ORDER — SODIUM CHLORIDE 0.9 % WEIGHT BASED INFUSION
3.0000 mL/kg/h | INTRAVENOUS | Status: AC
Start: 2022-05-31 — End: 2022-05-31
  Administered 2022-05-31: 3 mL/kg/h via INTRAVENOUS

## 2022-05-31 MED ORDER — LIDOCAINE HCL (PF) 1 % IJ SOLN
INTRAMUSCULAR | Status: DC | PRN
  Administered 2022-05-31 (×2): 5 mL via SUBCUTANEOUS

## 2022-05-31 MED ORDER — FENTANYL CITRATE (PF) 100 MCG/2ML IJ SOLN
INTRAMUSCULAR | Status: DC | PRN
  Administered 2022-05-31 (×2): 25 ug via INTRAVENOUS

## 2022-05-31 MED ORDER — MIDAZOLAM HCL 2 MG/2ML IJ SOLN
INTRAMUSCULAR | Status: AC
  Filled 2022-05-31: qty 2

## 2022-05-31 MED ORDER — HEPARIN SODIUM (PORCINE) 1000 UNIT/ML IJ SOLN
INTRAMUSCULAR | Status: DC | PRN
  Administered 2022-05-31: 5000 [IU] via INTRAVENOUS

## 2022-05-31 SURGICAL SUPPLY — 17 items
BAND CMPR LRG ZPHR (HEMOSTASIS) ×1
BAND ZEPHYR COMPRESS 30 LONG (HEMOSTASIS) ×1 IMPLANT
CATH BALLN WEDGE 5F 110CM (CATHETERS) ×1 IMPLANT
CATH INFINITI 6F ANG MULTIPACK (CATHETERS) ×1 IMPLANT
CATH INFINITI 6F FL3.5 (CATHETERS) ×1 IMPLANT
GLIDESHEATH SLEND SS 6F .021 (SHEATH) ×1 IMPLANT
GUIDEWIRE INQWIRE 1.5J.035X260 (WIRE) IMPLANT
INQWIRE 1.5J .035X260CM (WIRE) ×2
KIT HEART LEFT (KITS) ×2 IMPLANT
KIT MICROPUNCTURE NIT STIFF (SHEATH) ×1 IMPLANT
PACK CARDIAC CATHETERIZATION (CUSTOM PROCEDURE TRAY) ×2 IMPLANT
SHEATH GLIDE SLENDER 4/5FR (SHEATH) ×1 IMPLANT
SYR MEDRAD MARK 7 150ML (SYRINGE) ×2 IMPLANT
TRANSDUCER W/STOPCOCK (MISCELLANEOUS) ×2 IMPLANT
TUBING CIL FLEX 10 FLL-RA (TUBING) ×2 IMPLANT
TUBING CONTRAST HIGH PRESS 48 (TUBING) ×1 IMPLANT
WIRE HI TORQ VERSACORE-J 145CM (WIRE) ×1 IMPLANT

## 2022-05-31 NOTE — Interval H&P Note (Signed)
History and Physical Interval Note:  05/31/2022 7:13 AM  Mark Zamora  has presented today for surgery, with the diagnosis of aortic stenosis.  The various methods of treatment have been discussed with the patient and family. After consideration of risks, benefits and other options for treatment, the patient has consented to  Procedure(s): RIGHT/LEFT HEART CATH AND CORONARY ANGIOGRAPHY (N/A) as a surgical intervention.  The patient's history has been reviewed, patient examined, no change in status, stable for surgery.  I have reviewed the patient's chart and labs.  Questions were answered to the patient's satisfaction.     Early Osmond

## 2022-06-01 ENCOUNTER — Encounter (HOSPITAL_COMMUNITY): Payer: Self-pay | Admitting: Internal Medicine

## 2022-06-15 ENCOUNTER — Telehealth: Payer: Medicare Other | Admitting: Hospice

## 2022-06-15 DIAGNOSIS — R531 Weakness: Secondary | ICD-10-CM

## 2022-06-15 DIAGNOSIS — J209 Acute bronchitis, unspecified: Secondary | ICD-10-CM

## 2022-06-15 DIAGNOSIS — Z515 Encounter for palliative care: Secondary | ICD-10-CM

## 2022-06-15 DIAGNOSIS — G8929 Other chronic pain: Secondary | ICD-10-CM

## 2022-06-15 DIAGNOSIS — M545 Low back pain, unspecified: Secondary | ICD-10-CM

## 2022-06-15 DIAGNOSIS — J440 Chronic obstructive pulmonary disease with acute lower respiratory infection: Secondary | ICD-10-CM | POA: Diagnosis not present

## 2022-06-15 NOTE — Progress Notes (Signed)
[place] Consult Note Telephone: [phone]  Fax: [phone]  PATIENT NAME: Mark Zamora DOB: [birth year] MRN: [id number]  PRIMARY CARE PROVIDER:   Maury Dus, MD Mark Zamora, [place],  [place] [address]  REFERRING PROVIDER: Maury Dus, MD Mark Dus, MD [place],  [place] [address]  RESPONSIBLE PARTY: Self [place]     Name Relation Home Work Mobile   [facility] Daughter [phone]  [phone]   Mark Zamora, [name] [phone]  [phone]      TELEHEALTH VISIT STATEMENT Due to the COVID-19 crisis, this visit was done via telemedicine from my office and it was initiated and consent by this patient and or family.  I connected with patient OR PROXY by a telephone/video  and verified that I am speaking with the correct person. I discussed the limitations of evaluation and management by telemedicine. Patient/proxy expressed understanding and agreed to proceed. Palliative Care was asked to follow this patient to address advance care planning, complex medical decision making and goals of care clarification.  Visit is to build trust and highlight Palliative Medicine as specialized medical care for people living with serious illness, aimed at facilitating better quality of life through symptoms relief, assisting with advance care planning and complex medical decision making. This is a follow up visit.  RECOMMENDATIONS/PLAN:   Advance Care Planning/Code Status: Patient is a DO NOT RESUSCITATE  Goals of Care: Goals of care include to maximize quality of life and symptom management. Most selections include limited additional intervention, feeding tube for trial period time, IV fluids as indicated, antibiotics as indicated.    Visit consisted of counseling and education dealing with the complex and emotionally intense issues of symptom  management and palliative care in the setting of serious and potentially life-threatening illness. Palliative care team will continue to support patient, patient's family, and medical team.  Symptom management/Plan:  Weakness: Related to aortic stenosis/aortic valve dysfunction. Aortic stenosis managed by Cardiologist- Dr Mark Zamora . Recent heart catherization 05/31/2022 with no blockages; aortic valve repair is planned. F/u Jul 25, 2022.  Balance of rest and performance activity. 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 Continue with Tramadol and Tylenol in use; has not used tramadol in over a week.  Continue steroid shots from Ortho every three months.  COPD: no recent recent exacerbation. Continue Albuterol and Symbicort as ordered. Continue CPAP at night. Avoid triggers. Slow deep breathing.   Follow up: Palliative care will continue to follow for complex medical decision making, advance care planning, and clarification of goals. Return 6 weeks or prn. Encouraged to call provider sooner with any concerns.  CHIEF COMPLAINT: Palliative follow up  HISTORY OF PRESENT ILLNESS:  Mark Zamora a 76 y.o. male with multiple morbidities requiring close monitoring/management with high risk of complications and morbidity: Osteoarthritis, spinal stenosis, aortic stenosis, insomnia, depression in remission, hypertension, COPD. History obtained from review of EMR, discussion with primary team, family and/or patient. Records reviewed and summarized above. All 10 point systems reviewed and are negative except as documented in history of present illness above  Review and summarization of Epic records shows history from other than patient.   Palliative Care was asked to follow this patient o help address complex decision making in the context of advance care planning and goals of care clarification. I reviewed, as needed, available labs, patient records, imaging, studies and  related documents  from the EMR.   PERTINENT MEDICATIONS:  Outpatient Encounter Medications as of 06/15/2022  Medication Sig   acetaminophen (TYLENOL) 650 MG CR tablet Take 1,300 mg by mouth every 8 (eight) hours as needed for pain.   amLODipine (NORVASC) 5 MG tablet Take 5 mg by mouth daily.   aspirin EC 81 MG tablet Take 1 tablet (81 mg total) by mouth daily. Swallow whole.   budesonide-formoterol (SYMBICORT) 160-4.5 MCG/ACT inhaler Inhale 2 puffs into the lungs 2 (two) times daily.   Cholecalciferol (VITAMIN D3) 50 MCG (2000 UT) capsule Take 2,000 Units by mouth daily.   doxazosin (CARDURA) 8 MG tablet Take 8 mg by mouth at bedtime.   famotidine (PEPCID) 40 MG tablet Take 40 mg by mouth daily.   fluticasone (FLONASE) 50 MCG/ACT nasal spray Place 2 sprays into both nostrils at bedtime.   lisinopril (PRINIVIL,ZESTRIL) 40 MG tablet Take 40 mg by mouth daily.   Melatonin 10 MG TABS Take 20 mg by mouth at bedtime.   Multiple Vitamin (MULTIVITAMIN) capsule Take 1 capsule by mouth daily.   omeprazole (PRILOSEC) 20 MG capsule Take 20 mg by mouth every morning.    potassium chloride SA (K-DUR,KLOR-CON) 20 MEQ tablet Take 20 mEq by mouth daily.    pravastatin (PRAVACHOL) 80 MG tablet Take 80 mg by mouth every evening.    Psyllium (METAMUCIL) 28.3 % POWD Take 10 mLs by mouth daily.   spironolactone (ALDACTONE) 25 MG tablet Take 6.25 mg by mouth daily. Pt takes 1/4 tablet every day   tiZANidine (ZANAFLEX) 4 MG capsule Take 1 capsule (4 mg total) by mouth 3 (three) times daily.   traMADol (ULTRAM) 50 MG tablet Take 1-2 tablets (50-100 mg total) by mouth every 6 (six) hours as needed (mild pain). (Patient taking differently: Take 50-100 mg by mouth every 6 (six) hours as needed (Back pain).)   vitamin E 180 MG (400 UNITS) capsule Take 400 Units by mouth daily.   zolpidem (AMBIEN CR) 12.5 MG CR tablet Take 12.5 mg by mouth at bedtime.   No facility-administered encounter medications on file as of  06/15/2022.    HOSPICE ELIGIBILITY/DIAGNOSIS: TBD  PAST MEDICAL HISTORY:  Past Medical History:  Diagnosis Date   AAA (abdominal aortic aneurysm) ([place]) 01/29/2017   2.9cm   Allergic rhinitis    Arthritis    l ankle   BPH (benign prostatic hypertrophy)    Cancer (HCC)    HX SKIN CANCER   Cataract, bilateral    Chronic back pain    Chronic left hip pain    Constipation    Depression    Dyspnea    with exertion   Essential hypertension    GERD (gastroesophageal reflux disease)    History of chronic sinusitis    History of dizziness 03/2016   History of skin cancer    Hyperlipidemia    Hypokalemia 03/2016   Insomnia    Neck pain, chronic    Nocturia    OSA on CPAP    uses cpap   Pedal edema    Chronic   Postherpetic neuralgia    Shingles       ALLERGIES:  Allergies  Allergen Reactions   Compazine [Prochlorperazine Edisylate] Anaphylaxis and Other (See Comments)    Pt states "any nausea med ending in "zine"  Seizures   Cymbalta [Duloxetine Hcl] Other (See Comments)    Urinary retention    Promethazine Anaphylaxis and Other (See Comments)    seizures   Codeine Other (See  Comments)    constipation   Other Other (See Comments)    All antiemetic meds ending -zine  Prefers to not take any narcotic pain meds    I spent 40 minutes providing this consultation; this includes time spent with patient/family, chart review and documentation. More than 50% of the time in this consultation was spent on counseling and coordinating communication.   Thank you for the opportunity to participate in the care of Mark Zamora Please call our office at [phone] if we can be of additional assistance.  Note: Portions of this note were generated with Lobbyist. Dictation errors may occur despite best attempts at proofreading.  Teodoro Spray, NP

## 2022-06-15 NOTE — Progress Notes (Unsigned)
[facility] Consult Note Telephone: [phone]  Fax: [phone]  PATIENT NAME: Mark Zamora DOB: 07-10-1946 MRN: [id number]  PRIMARY CARE PROVIDER:   Maury Dus, MD Maury Dus, [place],  [place] [address]  REFERRING PROVIDER: Maury Dus, MD Maury Dus, MD [place],  [place] [address]  RESPONSIBLE PARTY: Self [place]     Name Relation Home Work Mobile   [facility] Daughter [phone]  [phone]   Xavi, [place] [phone]  [phone]      TELEHEALTH VISIT STATEMENT Due to the COVID-19 crisis, this visit was done via telemedicine from my office and it was initiated and consent by this patient and or family.  I connected with patient OR PROXY by a telephone/video  and verified that I am speaking with the correct person. I discussed the limitations of evaluation and management by telemedicine. Patient/proxy expressed understanding and agreed to proceed. Palliative Care was asked to follow this patient to address advance care planning, complex medical decision making and goals of care clarification.  Visit is to build trust and highlight Palliative Medicine as specialized medical care for people living with serious illness, aimed at facilitating better quality of life through symptoms relief, assisting with advance care planning and complex medical decision making. This is a follow up visit.  RECOMMENDATIONS/PLAN:   Advance Care Planning/Code Status: Patient is a DO NOT RESUSCITATE  Goals of Care: Goals of care include to maximize quality of life and symptom management. Most selections include limited additional intervention, feeding tube for trial period time, IV fluids as indicated, antibiotics as indicated.    Visit consisted of counseling and education dealing with the complex and emotionally intense issues of symptom  management and palliative care in the setting of serious and potentially life-threatening illness. Palliative care team will continue to support patient, patient's family, and medical team.  Symptom management/Plan:  Weakness: Related to aortic stenosis/aortic valve dysfunction. Aortic stenosis managed by Cardiologist- Dr Ali Lowe . Recent heart catherization 05/31/2022 with no blockages; aortic valve repair is planned. F/u Jul 25, 2022.  Balance of rest and performance activity. 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 Continue with Tramadol and Tylenol in use; has not used tramadol in over a week.  Continue steroid shots from Ortho every three months.  COPD: no recent recent exacerbation. Continue Albuterol and Symbicort as ordered. Continue CPAP at night. Avoid triggers. Slow deep breathing.   Follow up: Palliative care will continue to follow for complex medical decision making, advance care planning, and clarification of goals. Return 6 weeks or prn. Encouraged to call provider sooner with any concerns.  CHIEF COMPLAINT: Palliative follow up  HISTORY OF PRESENT ILLNESS:  Mark Zamora a 76 y.o. male with multiple morbidities requiring close monitoring/management with high risk of complications and morbidity: Osteoarthritis, spinal stenosis, aortic stenosis, insomnia, depression in remission, hypertension, COPD. History obtained from review of EMR, discussion with primary team, family and/or patient. Records reviewed and summarized above. All 10 point systems reviewed and are negative except as documented in history of present illness above  Review and summarization of Epic records shows history from other than patient.   Palliative Care was asked to follow this patient o help address complex decision making in the context of advance care planning and goals of care clarification. I reviewed, as needed, available labs, patient records, imaging, studies and  related documents  from the EMR.   PERTINENT MEDICATIONS:  Outpatient Encounter Medications as of 06/15/2022  Medication Sig   acetaminophen (TYLENOL) 650 MG CR tablet Take 1,300 mg by mouth every 8 (eight) hours as needed for pain.   amLODipine (NORVASC) 5 MG tablet Take 5 mg by mouth daily.   aspirin EC 81 MG tablet Take 1 tablet (81 mg total) by mouth daily. Swallow whole.   budesonide-formoterol (SYMBICORT) 160-4.5 MCG/ACT inhaler Inhale 2 puffs into the lungs 2 (two) times daily.   Cholecalciferol (VITAMIN D3) 50 MCG (2000 UT) capsule Take 2,000 Units by mouth daily.   doxazosin (CARDURA) 8 MG tablet Take 8 mg by mouth at bedtime.   famotidine (PEPCID) 40 MG tablet Take 40 mg by mouth daily.   fluticasone (FLONASE) 50 MCG/ACT nasal spray Place 2 sprays into both nostrils at bedtime.   lisinopril (PRINIVIL,ZESTRIL) 40 MG tablet Take 40 mg by mouth daily.   Melatonin 10 MG TABS Take 20 mg by mouth at bedtime.   Multiple Vitamin (MULTIVITAMIN) capsule Take 1 capsule by mouth daily.   omeprazole (PRILOSEC) 20 MG capsule Take 20 mg by mouth every morning.    potassium chloride SA (K-DUR,KLOR-CON) 20 MEQ tablet Take 20 mEq by mouth daily.    pravastatin (PRAVACHOL) 80 MG tablet Take 80 mg by mouth every evening.    Psyllium (METAMUCIL) 28.3 % POWD Take 10 mLs by mouth daily.   spironolactone (ALDACTONE) 25 MG tablet Take 6.25 mg by mouth daily. Pt takes 1/4 tablet every day   tiZANidine (ZANAFLEX) 4 MG capsule Take 1 capsule (4 mg total) by mouth 3 (three) times daily.   traMADol (ULTRAM) 50 MG tablet Take 1-2 tablets (50-100 mg total) by mouth every 6 (six) hours as needed (mild pain). (Patient taking differently: Take 50-100 mg by mouth every 6 (six) hours as needed (Back pain).)   vitamin E 180 MG (400 UNITS) capsule Take 400 Units by mouth daily.   zolpidem (AMBIEN CR) 12.5 MG CR tablet Take 12.5 mg by mouth at bedtime.   No facility-administered encounter medications on file as of  06/15/2022.    HOSPICE ELIGIBILITY/DIAGNOSIS: TBD  PAST MEDICAL HISTORY:  Past Medical History:  Diagnosis Date   AAA (abdominal aortic aneurysm) ([place]) 01/29/2017   2.9cm   Allergic rhinitis    Arthritis    l ankle   BPH (benign prostatic hypertrophy)    Cancer (HCC)    HX SKIN CANCER   Cataract, bilateral    Chronic back pain    Chronic left hip pain    Constipation    Depression    Dyspnea    with exertion   Essential hypertension    GERD (gastroesophageal reflux disease)    History of chronic sinusitis    History of dizziness 03/2016   History of skin cancer    Hyperlipidemia    Hypokalemia 03/2016   Insomnia    Neck pain, chronic    Nocturia    OSA on CPAP    uses cpap   Pedal edema    Chronic   Postherpetic neuralgia    Shingles       ALLERGIES:  Allergies  Allergen Reactions   Compazine [Prochlorperazine Edisylate] Anaphylaxis and Other (See Comments)    Pt states "any nausea med ending in "zine"  Seizures   Cymbalta [Duloxetine Hcl] Other (See Comments)    Urinary retention    Promethazine Anaphylaxis and Other (See Comments)    seizures   Codeine Other (See  Comments)    constipation   Other Other (See Comments)    All antiemetic meds ending -zine  Prefers to not take any narcotic pain meds    I spent 40 minutes providing this consultation; this includes time spent with patient/family, chart review and documentation. More than 50% of the time in this consultation was spent on counseling and coordinating communication.   Thank you for the opportunity to participate in the care of GIORDANO GETMAN Please call our office at [phone] if we can be of additional assistance.  Note: Portions of this note were generated with Lobbyist. Dictation errors may occur despite best attempts at proofreading.  Teodoro Spray, NP

## 2022-06-20 ENCOUNTER — Telehealth: Payer: Self-pay | Admitting: Internal Medicine

## 2022-06-20 NOTE — Telephone Encounter (Signed)
Pt c/o medication issue:  1. Name of Medication: Mucinex  Maximum Strength 1200 mg   2. How are you currently taking this medication (dosage and times per day)? Once daily at night   3. Are you having a reaction (difficulty breathing--STAT)?   4. What is your medication issue? Patient wanted to know if he should hold this medication prior to his CT Wed 06/21/22

## 2022-06-20 NOTE — Telephone Encounter (Signed)
Pt will hold his Mucinex tonight prior to his Cardiac CT planned for tomorrow as a precaution for some rick of increased HR with taking it... he denies acute illness just typical congestion this time of year that he has chronically.

## 2022-06-21 ENCOUNTER — Ambulatory Visit (HOSPITAL_COMMUNITY)
Admission: RE | Admit: 2022-06-21 | Discharge: 2022-06-21 | Disposition: A | Discharge status: Home / Self Care | Payer: Medicare Other | Source: Ambulatory Visit | Attending: Internal Medicine | Admitting: Internal Medicine

## 2022-06-21 ENCOUNTER — Other Ambulatory Visit (HOSPITAL_COMMUNITY): Payer: Medicare Other

## 2022-06-21 DIAGNOSIS — I350 Nonrheumatic aortic (valve) stenosis: Secondary | ICD-10-CM

## 2022-06-21 DIAGNOSIS — Z01818 Encounter for other preprocedural examination: Secondary | ICD-10-CM | POA: Diagnosis not present

## 2022-06-21 DIAGNOSIS — K5790 Diverticulosis of intestine, part unspecified, without perforation or abscess without bleeding: Secondary | ICD-10-CM | POA: Diagnosis not present

## 2022-06-21 DIAGNOSIS — I358 Other nonrheumatic aortic valve disorders: Secondary | ICD-10-CM | POA: Diagnosis not present

## 2022-06-21 DIAGNOSIS — I2510 Atherosclerotic heart disease of native coronary artery without angina pectoris: Secondary | ICD-10-CM | POA: Diagnosis not present

## 2022-06-21 DIAGNOSIS — N3289 Other specified disorders of bladder: Secondary | ICD-10-CM | POA: Diagnosis not present

## 2022-06-21 DIAGNOSIS — I771 Stricture of artery: Secondary | ICD-10-CM | POA: Diagnosis not present

## 2022-06-21 MED ORDER — IOHEXOL 350 MG/ML SOLN
95.0000 mL | Freq: Once | INTRAVENOUS | Status: AC | PRN
  Administered 2022-06-21: 95 mL via INTRAVENOUS

## 2022-06-24 DIAGNOSIS — M5416 Radiculopathy, lumbar region: Secondary | ICD-10-CM | POA: Diagnosis not present

## 2022-06-26 DIAGNOSIS — R3915 Urgency of urination: Secondary | ICD-10-CM | POA: Diagnosis not present

## 2022-06-26 DIAGNOSIS — R3912 Poor urinary stream: Secondary | ICD-10-CM | POA: Diagnosis not present

## 2022-07-26 ENCOUNTER — Institutional Professional Consult (permissible substitution): Payer: Medicare Other | Admitting: Surgery

## 2022-07-26 ENCOUNTER — Encounter: Payer: Self-pay | Admitting: Surgery

## 2022-07-26 ENCOUNTER — Other Ambulatory Visit: Payer: Self-pay

## 2022-07-26 VITALS — BP 135/74 | HR 59 | Resp 18 | Ht 64.0 in

## 2022-07-26 DIAGNOSIS — I350 Nonrheumatic aortic (valve) stenosis: Secondary | ICD-10-CM | POA: Diagnosis not present

## 2022-07-26 NOTE — Progress Notes (Signed)
Patient ID: Mark Zamora, male   DOB: 02-14-1946, 76 y.o.   MRN: [id number]  [facility]       [address]       [place] [address]             [phone]          CARDIOTHORACIC SURGERY CONSULTATION REPORT  PCP is Maury Dus, MD Referring Provider is Lenna Sciara, MD Primary Cardiologist is None  Reason for consultation:  Severe aortic stenosis  HPI:  The patient is a 76 year old gentleman with a history of hypertension, hyperlipidemia, OSA on CPAP, previous smoking and COPD, osteoarthritis and spinal stenosis, and severe aortic stenosis who was referred for consideration of TAVR.  Over the past 6 months he has had increasing shortness of breath with activities around his house and walking his dog.  He has been the primary caregiver for his wife who is suffering from dementia but recently had to put her in an assisted living center because he could not care for her any longer.  He is still able to do his activities of daily living for himself.  A 2D echocardiogram on 05/17/2022 showed a moderately thickened and calcified aortic valve with a mean gradient of 38 mmHg and a dimensionless index of 0.2 with a valve area by VTI of 0.62 cm.  Left ventricular ejection fraction was 55 to 60%.  There was trivial mitral regurgitation.  Cardiac catheterization was performed on 05/31/2022 and showed mild nonobstructive coronary disease.  The patient is here today by himself.  He quit smoking over 25 years ago.  He does see a dentist regularly.  He has 2 adult children.  Past Medical History:  Diagnosis Date   AAA (abdominal aortic aneurysm) ([place]) 01/29/2017   2.9cm   Allergic rhinitis    Arthritis    l ankle   BPH (benign prostatic hypertrophy)    Cancer (HCC)    HX SKIN CANCER   Cataract, bilateral    Chronic back pain    Chronic left hip pain    Constipation    Depression    Dyspnea    with exertion    Essential hypertension    GERD (gastroesophageal reflux disease)    History of chronic sinusitis    History of dizziness 03/2016   History of skin cancer    Hyperlipidemia    Hypokalemia 03/2016   Insomnia    Neck pain, chronic    Nocturia    OSA on CPAP    uses cpap   Pedal edema    Chronic   Postherpetic neuralgia    Shingles     Past Surgical History:  Procedure Laterality Date   ABDOMINAL AORTOGRAM N/A 05/31/2022   Procedure: ABDOMINAL AORTOGRAM;  Surgeon: Early Osmond, MD;  Location: [facility];  Service: Cardiovascular;  Laterality: N/A;   ANKLE ARTHROSCOPY Left 03/ 2012   CATARACT EXTRACTION, BILATERAL     COLONOSCOPY     KNEE ARTHROSCOPY Left 04-19-2011   KNEE ARTHROSCOPY WITH MEDIAL MENISECTOMY Right 05/18/2015   Procedure: RIGHT KNEE ARTHROSCOPY WITH  MEDIAL MENISCAL DEBRIDEMENT;  Surgeon: Gaynelle Arabian, MD;  Location: [facility];  Service: Orthopedics;  Laterality: Right;   pilonidal fistulectomy     RIGHT HEART CATH AND CORONARY ANGIOGRAPHY N/A 05/31/2022   Procedure: RIGHT HEART CATH AND CORONARY ANGIOGRAPHY;  Surgeon: Early Osmond, MD;  Location: [facility];  Service: Cardiovascular;  Laterality: N/A;   TOTAL HIP ARTHROPLASTY Left 12/12/2017   Procedure: LEFT TOTAL HIP ARTHROPLASTY ANTERIOR APPROACH;  Surgeon: Gaynelle Arabian, MD;  Location: [facility];  Service: Orthopedics;  Laterality: Left;   TRANSURETHRAL RESECTION OF PROSTATE     TRANSURETHRAL RESECTION OF PROSTATE N/A 06/25/2015   Procedure: TRANSURETHRAL RESECTION OF THE PROSTATE WITH GYRUS INSTRUMENTS BLADDER BIOPSY AND FULGURATION;  Surgeon: Cleon Gustin, MD;  Location: [facility];  Service: Urology;  Laterality: N/A;    Family History  Problem Relation Age of Onset   Hypercholesterolemia Mother    Stroke Mother        diagnosed w/DM,CVD   CVA Father    Seizures Father     Social History   Socioeconomic History   Marital status: Married    Spouse name: Not on  file   Number of children: Not on file   Years of education: Not on file   Highest education level: Not on file  Occupational History   Not on file  Tobacco Use   Smoking status: Former    Types: Cigarettes    Quit date: 05/10/1994    Years since quitting: 28.2   Smokeless tobacco: Never  Vaping Use   Vaping Use: Never used  Substance and Sexual Activity   Alcohol use: Yes    Alcohol/week: 3.0 standard drinks of alcohol    Types: 3 Shots of liquor per week    Comment: 7 DRINKS PER WEEK   Drug use: No   Sexual activity: Not on file  Other Topics Concern   Not on file  Social History Narrative   Not on file   Social Determinants of Health   Financial Resource Strain: Not on file  Food Insecurity: Not on file  Transportation Needs: Not on file  Physical Activity: Not on file  Stress: Not on file  Social Connections: Not on file  Intimate Partner Violence: Not on file    Prior to Admission medications   Medication Sig Start Date End Date Taking? Authorizing Provider  acetaminophen (TYLENOL) 650 MG CR tablet Take 1,300 mg by mouth every 8 (eight) hours as needed for pain.   Yes [provider]  amLODipine (NORVASC) 5 MG tablet Take 5 mg by mouth daily.   Yes [provider]  aspirin EC 81 MG tablet Take 1 tablet (81 mg total) by mouth daily. Swallow whole. 05/22/22  Yes Early Osmond, MD  budesonide-formoterol [facility]) 160-4.5 MCG/ACT inhaler Inhale 2 puffs into the lungs 2 (two) times daily.   Yes [provider]  Cholecalciferol (VITAMIN D3) 50 MCG (2000 UT) capsule Take 2,000 Units by mouth daily.   Yes [provider]  doxazosin (CARDURA) 8 MG tablet Take 8 mg by mouth at bedtime.   Yes [provider]  famotidine (PEPCID) 40 MG tablet Take 40 mg by mouth daily.   Yes [provider]  fluticasone (FLONASE) 50 MCG/ACT nasal spray Place 2 sprays into both nostrils at bedtime.   Yes [provider]   lisinopril (PRINIVIL,ZESTRIL) 40 MG tablet Take 40 mg by mouth daily.   Yes [provider]  Melatonin 10 MG TABS Take 20 mg by mouth at bedtime.   Yes [provider]  Multiple Vitamin (MULTIVITAMIN) capsule Take 1 capsule by mouth daily.   Yes [provider]  omeprazole (PRILOSEC) 20 MG capsule Take 20 mg by mouth every morning.    Yes [provider]  potassium chloride SA (K-DUR,KLOR-CON)  20 MEQ tablet Take 20 mEq by mouth daily.    Yes [provider]  pravastatin (PRAVACHOL) 80 MG tablet Take 80 mg by mouth every evening.    Yes [provider]  Psyllium (METAMUCIL) 28.3 % POWD Take 10 mLs by mouth daily.   Yes [provider]  spironolactone (ALDACTONE) 25 MG tablet Take 6.25 mg by mouth daily. Pt takes 1/4 tablet every day   Yes [provider]  tiZANidine (ZANAFLEX) 4 MG capsule Take 1 capsule (4 mg total) by mouth 3 (three) times daily. 12/13/17  Yes Perkins, Alexzandrew L, PA-C  traMADol (ULTRAM) 50 MG tablet Take 1-2 tablets (50-100 mg total) by mouth every 6 (six) hours as needed (mild pain). Patient taking differently: Take 50-100 mg by mouth every 6 (six) hours as needed (Back pain). 12/13/17  Yes Perkins, Alexzandrew L, PA-C  vitamin E 180 MG (400 UNITS) capsule Take 400 Units by mouth daily.   Yes [provider]  zolpidem (AMBIEN CR) 12.5 MG CR tablet Take 12.5 mg by mouth at bedtime.   Yes [provider]    Current Outpatient Medications  Medication Sig Dispense Refill   acetaminophen (TYLENOL) 650 MG CR tablet Take 1,300 mg by mouth every 8 (eight) hours as needed for pain.     amLODipine (NORVASC) 5 MG tablet Take 5 mg by mouth daily.     aspirin EC 81 MG tablet Take 1 tablet (81 mg total) by mouth daily. Swallow whole. 90 tablet 3   budesonide-formoterol (SYMBICORT) 160-4.5 MCG/ACT inhaler Inhale 2 puffs into the lungs 2 (two) times daily.     Cholecalciferol (VITAMIN D3) 50 MCG  (2000 UT) capsule Take 2,000 Units by mouth daily.     doxazosin (CARDURA) 8 MG tablet Take 8 mg by mouth at bedtime.     famotidine (PEPCID) 40 MG tablet Take 40 mg by mouth daily.     fluticasone (FLONASE) 50 MCG/ACT nasal spray Place 2 sprays into both nostrils at bedtime.     lisinopril (PRINIVIL,ZESTRIL) 40 MG tablet Take 40 mg by mouth daily.     Melatonin 10 MG TABS Take 20 mg by mouth at bedtime.     Multiple Vitamin (MULTIVITAMIN) capsule Take 1 capsule by mouth daily.     omeprazole (PRILOSEC) 20 MG capsule Take 20 mg by mouth every morning.      potassium chloride SA (K-DUR,KLOR-CON) 20 MEQ tablet Take 20 mEq by mouth daily.      pravastatin (PRAVACHOL) 80 MG tablet Take 80 mg by mouth every evening.      Psyllium (METAMUCIL) 28.3 % POWD Take 10 mLs by mouth daily.     spironolactone (ALDACTONE) 25 MG tablet Take 6.25 mg by mouth daily. Pt takes 1/4 tablet every day     tiZANidine (ZANAFLEX) 4 MG capsule Take 1 capsule (4 mg total) by mouth 3 (three) times daily. 90 capsule 0   traMADol (ULTRAM) 50 MG tablet Take 1-2 tablets (50-100 mg total) by mouth every 6 (six) hours as needed (mild pain). (Patient taking differently: Take 50-100 mg by mouth every 6 (six) hours as needed (Back pain).) 56 tablet 0   vitamin E 180 MG (400 UNITS) capsule Take 400 Units by mouth daily.     zolpidem (AMBIEN CR) 12.5 MG CR tablet Take 12.5 mg by mouth at bedtime.     No current facility-administered medications for this visit.    Allergies  Allergen Reactions   Compazine [Prochlorperazine Edisylate] Anaphylaxis and  Other (See Comments)    Pt states "any nausea med ending in "zine"  Seizures   Cymbalta [Duloxetine Hcl] Other (See Comments)    Urinary retention    Promethazine Anaphylaxis and Other (See Comments)    seizures   Codeine Other (See Comments)    constipation   Other Other (See Comments)    All antiemetic meds ending -zine  Prefers to not take any narcotic pain meds       Review of Systems:   General:  normal appetite, + decreased energy, no weight gain, no weight loss, no fever  Cardiac:  no chest pain with exertion, no chest pain at rest, +SOB with moderate exertion, no resting SOB, no PND, no orthopnea, no palpitations, no arrhythmia, no atrial fibrillation, no LE edema, no dizzy spells, no syncope  Respiratory:  + exertional shortness of breath, no home oxygen, no productive cough, no dry cough, no bronchitis, no wheezing, no hemoptysis, no asthma, no pain with inspiration or cough, + sleep apnea, + CPAP at night  GI:   no difficulty swallowing, no reflux, + frequent heartburn, no hiatal hernia, no abdominal pain, no constipation, no diarrhea, no hematochezia, no hematemesis, no melena  GU:   no dysuria,  + frequency, no urinary tract infection, no hematuria, no enlarged prostate, no kidney stones, no kidney disease  Vascular:  no pain suggestive of claudication, no pain in feet, no leg cramps, no varicose veins, no DVT, no non-healing foot ulcer  Neuro:   no stroke, no TIA's, no seizures, no headaches, no temporary blindness one eye,  no slurred speech, no peripheral neuropathy, no chronic pain, + instability of gait, no memory/cognitive dysfunction  Musculoskeletal: No arthritis, no joint swelling, no myalgias, + difficulty walking, + decreased mobility   Skin:   no rash, no itching, no skin infections, no pressure sores or ulcerations  Psych:   no anxiety, no depression, no nervousness, no unusual recent stress  Eyes:   no blurry vision, no floaters, no recent vision changes, + wears glasses  ENT:   no hearing loss, no loose or painful teeth, no dentures, last saw dentist recently  Hematologic:  no easy bruising, no abnormal bleeding, no clotting disorder, no frequent epistaxis  Endocrine:  no diabetes, does not check CBG's at home     Physical Exam:   BP 135/74 (BP Location: Right Arm, Patient Position: Sitting)   Pulse (!) 59   Resp 18   Ht 5'  4" (1.626 m)   SpO2 94% Comment: RA  BMI 29.70 kg/m   General:  Elderly,  well-appearing  HEENT:  Unremarkable, NCAT, PERLA, EOMI  Neck:   no JVD, no bruits, no adenopathy   Chest:   clear to auscultation, symmetrical breath sounds, no wheezes, no rhonchi   CV:   RRR, 3/6 systolic murmur RSB, no diastolic murmur  Abdomen:  soft, non-tender, no masses   Extremities:  warm, well-perfused, pulses palpable at ankles, no lower extremity edema  Rectal/GU  Deferred  Neuro:   Grossly non-focal and symmetrical throughout  Skin:   Clean and dry, no rashes, no breakdown  Diagnostic Tests:  ECHOCARDIOGRAM REPORT         Patient Name:   Mark Zamora Date of Exam: 05/17/2022  Medical Rec #:  [id number]          Height:       64.0 in  Accession #:    [id number]         Weight:  206.0 lb  Date of Birth:  08/02/1946         BSA:          1.981 m  Patient Age:    26 years           BP:           172/90 mmHg  Patient Gender: M                  HR:           49 bpm.  Exam Location:  [facility]   Procedure: 2D Echo, Cardiac Doppler, Color Doppler and Strain Analysis   Indications:    I35.0 Aortic Stenosis     History:        Patient has no prior history of Echocardiogram  examinations. No                  cardiac history.     Sonographer:    Marygrace Drought RCS  Referring Phys: [id number] [place]     1. Left ventricular ejection fraction, by estimation, is 55 to 60%. The  left ventricle has normal function. The left ventricle demonstrates  regional wall motion abnormalities (see scoring diagram/findings for  description). Left ventricular diastolic  parameters are indeterminate. There is mild hypokinesis of the left  ventricular, basal inferolateral wall and inferior wall. The average left  ventricular global longitudinal strain is -16.6 %. The global longitudinal  strain is normal.   2. Right ventricular systolic function is normal. The right ventricular   size is normal. There is normal pulmonary artery systolic pressure.   3. Left atrial size was mild to moderately dilated.   4. The mitral valve is normal in structure. Trivial mitral valve  regurgitation. No evidence of mitral stenosis.   5. The aortic valve has an indeterminant number of cusps. There is  moderate calcification of the aortic valve. There is moderate thickening  of the aortic valve. Aortic valve regurgitation is not visualized. Severe  aortic valve stenosis. Aortic valve  area, by VTI measures 0.62 cm. Aortic valve mean gradient measures 38.0  mmHg. Aortic valve Vmax measures 3.87 m/s.   6. The inferior vena cava is normal in size with greater than 50%  respiratory variability, suggesting right atrial pressure of 3 mmHg.   Comparison(s): No prior Echocardiogram.   Conclusion(s)/Recommendation(s): Likely severe aortic stenosis--moderate  by gradient/velocity, but severe by DI and AVA.   FINDINGS   Left Ventricle: Left ventricular ejection fraction, by estimation, is 55  to 60%. The left ventricle has normal function. The left ventricle  demonstrates regional wall motion abnormalities. Mild hypokinesis of the  left ventricular, basal inferolateral  wall and inferior wall. The average left ventricular global longitudinal  strain is -16.6 %. The global longitudinal strain is normal. The left  ventricular internal cavity size was normal in size. There is no left  ventricular hypertrophy. Left ventricular   diastolic parameters are indeterminate.   Right Ventricle: The right ventricular size is normal. No increase in  right ventricular wall thickness. Right ventricular systolic function is  normal. There is normal pulmonary artery systolic pressure. The tricuspid  regurgitant velocity is 1.81 m/s, and   with an assumed right atrial pressure of 3 mmHg, the estimated right  ventricular systolic pressure is 26.3 mmHg.   Left Atrium: Left atrial size was mild to  moderately dilated.   Right Atrium: Right atrial size was normal in  size.   Pericardium: Trivial pericardial effusion is present.   Mitral Valve: The mitral valve is normal in structure. There is mild  thickening of the mitral valve leaflet(s). There is mild calcification of  the mitral valve leaflet(s). Mild mitral annular calcification. Trivial  mitral valve regurgitation. No evidence   of mitral valve stenosis.   Tricuspid Valve: The tricuspid valve is normal in structure. Tricuspid  valve regurgitation is trivial. No evidence of tricuspid stenosis.   Aortic Valve: The aortic valve has an indeterminant number of cusps. There  is moderate calcification of the aortic valve. There is moderate  thickening of the aortic valve. Aortic valve regurgitation is not  visualized. Severe aortic stenosis is present.  Aortic valve mean gradient measures 38.0 mmHg. Aortic valve peak gradient  measures 59.9 mmHg. Aortic valve area, by VTI measures 0.62 cm.   Pulmonic Valve: The pulmonic valve was grossly normal. Pulmonic valve  regurgitation is trivial. No evidence of pulmonic stenosis.   Aorta: The aortic root, ascending aorta, aortic arch and descending aorta  are all structurally normal, with no evidence of dilitation or  obstruction.   Venous: The inferior vena cava is normal in size with greater than 50%  respiratory variability, suggesting right atrial pressure of 3 mmHg.   IAS/Shunts: The atrial septum is grossly normal.      LEFT VENTRICLE  PLAX 2D  LVIDd:         5.40 cm   Diastology  LVIDs:         3.70 cm   LV e' medial:    5.00 cm/s  LV PW:         0.90 cm   LV E/e' medial:  17.2  LV IVS:        1.20 cm   LV e' lateral:   7.83 cm/s  LVOT diam:     2.00 cm   LV E/e' lateral: 11.0  LV SV:         69  LV SV Index:   35        2D Longitudinal Strain  LVOT Area:     3.14 cm  2D Strain GLS (A2C):   -17.5 %                           2D Strain GLS (A3C):   -15.2 %                            2D Strain GLS (A4C):   -17.0 %                           2D Strain GLS Avg:     -16.6 %   RIGHT VENTRICLE  RV Basal diam:  3.60 cm  RV S prime:     14.50 cm/s  TAPSE (M-mode): 2.5 cm  RVSP:           16.1 mmHg   LEFT ATRIUM             Index        RIGHT ATRIUM           Index  LA diam:        4.30 cm 2.17 cm/m   RA Pressure: 3.00 mmHg  LA Vol (A2C):   61.1 ml 30.84 ml/m  RA Area:     16.30 cm  LA  Vol (A4C):   60.2 ml 30.39 ml/m  RA Volume:   42.40 ml  21.40 ml/m  LA Biplane Vol: 63.2 ml 31.90 ml/m   AORTIC VALVE  AV Area (Vmax):    0.67 cm  AV Area (Vmean):   0.55 cm  AV Area (VTI):     0.62 cm  AV Vmax:           387.00 cm/s  AV Vmean:          291.000 cm/s  AV VTI:            1.120 m  AV Peak Grad:      59.9 mmHg  AV Mean Grad:      38.0 mmHg  LVOT Vmax:         82.20 cm/s  LVOT Vmean:        50.600 cm/s  LVOT VTI:          0.221 m  LVOT/AV VTI ratio: 0.20     AORTA  Ao Root diam: 3.30 cm  Ao Asc diam:  3.60 cm   MITRAL VALVE                TRICUSPID VALVE  MV Area (PHT):              TR Peak grad:   13.1 mmHg  MV Decel Time:              TR Vmax:        181.00 cm/s  MV E velocity: 85.90 cm/s   Estimated RAP:  3.00 mmHg  MV A velocity: 123.00 cm/s  RVSP:           16.1 mmHg  MV E/A ratio:  0.70                              SHUNTS                              Systemic VTI:  0.22 m                              Systemic Diam: 2.00 cm   Buford Dresser MD  Electronically signed by Buford Dresser MD  Signature Date/Time: 05/17/2022/6:55:48 PM         Final     Physicians  Panel Physicians Referring Physician Case Authorizing Physician  Early Osmond, MD (Primary)     Procedures  ABDOMINAL AORTOGRAM  RIGHT HEART CATH AND CORONARY ANGIOGRAPHY   Conclusion  1.  Mild obstructive coronary artery disease. 2.  Cardiac output of 6.3 L/min and index of 3.4 L/min with a mean RA pressure of 10 mmHg and a mean wedge pressure of 14  mmHg. 3.  Capacious iliofemoral vessels bilaterally.   Recommendation: Continue evaluation for aortic valve intervention.   Indications  Nonrheumatic aortic valve stenosis [I35.0 (ICD-10-CM)]   Clinical Presentation  CHF/Shock Congestive heart failure not present. No shock present.   Procedural Details  Technical Details The patient is a 76 year old male with a history of severe symptomatic aortic stenosis, COPD, osteoarthritis, and spinal stenosis was evaluated in the outpatient setting due to dyspnea and exertional angina.  He is referred for right heart catheterization and coronary angiography study for preprocedural assessment.  Ultrasound was used to gain access to the right internal jugular vein  and a 5 French sheath was placed due to the inability to exchange for sheath in the antecubital vein. Ultrasound was then used to gain access to the right radial artery and a 6 French femoral glide sheath was placed there.  5000 units heparin and 5 mg of verapamil were administered through the sheath.  Right heart catheterization, coronary angiography, and abdominal aortography was performed with standard catheters.  There were no acute complications.  A TR band was placed and manual pressure applied to the right internal jugular site. Estimated blood loss <50 mL.   During this procedure medications were administered to achieve and maintain moderate conscious sedation while the patient's heart rate, blood pressure, and oxygen saturation were continuously monitored and I was present face-to-face 100% of this time.   Medications (Filter: Administrations occurring from 0749 to 0853 on 05/31/22) fentaNYL (SUBLIMAZE) injection (mcg) Total dose:  50 mcg  Date/Time Rate/Dose/Volume Action   05/31/22 0752 25 mcg Given   0804 25 mcg Given    midazolam (VERSED) injection (mg) Total dose:  2 mg  Date/Time Rate/Dose/Volume Action   05/31/22 0753 1 mg Given   0804 1 mg Given    lidocaine  (PF) (XYLOCAINE) 1 % injection (mL) Total volume:  10 mL  Date/Time Rate/Dose/Volume Action   05/31/22 0758 5 mL Given   0758 5 mL Given    Radial Cocktail/Verapamil only (mL) Total volume:  10 mL  Date/Time Rate/Dose/Volume Action   05/31/22 0806 10 mL Given    heparin sodium (porcine) injection (Units) Total dose:  5,000 Units  Date/Time Rate/Dose/Volume Action   05/31/22 0806 5,000 Units Given    Heparin (Porcine) in NaCl 1000-0.9 UT/500ML-% SOLN (mL) Total volume:  1,000 mL  Date/Time Rate/Dose/Volume Action   05/31/22 0835 500 mL Given   0835 500 mL Given    iohexol (OMNIPAQUE) 350 MG/ML injection (mL) Total volume:  85 mL  Date/Time Rate/Dose/Volume Action   05/31/22 0835 85 mL Given    Sedation Time  Sedation Time Physician-1: 40 minutes 26 seconds Contrast  Medication Name Total Dose  iohexol (OMNIPAQUE) 350 MG/ML injection 85 mL   Radiation/Fluoro  Fluoro time: 5.8 (min) DAP: 13310 (mGycm2) Cumulative Air Kerma: 944 (mGy) Complications  Complications documented before study signed (05/31/2022  9:67 AM)   No complications were associated with this study.  Documented by Stacy Gardner, RN - 05/31/2022  8:43 AM     Wound Indication  Wound Assessment No wound present.   Coronary Findings  Diagnostic Dominance: Right Left Anterior Descending  There is mild diffuse disease throughout the vessel.    Left Circumflex  The vessel exhibits minimal luminal irregularities.    Right Coronary Artery  There is mild diffuse disease throughout the vessel.    Intervention   No interventions have been documented.   Peripheral Vascular Findings  Diagnostic  Left External Iliac  The vessel exhibits minimal luminal irregularities.    Right External Iliac  The vessel exhibits minimal luminal irregularities.    Intervention   No interventions have been documented.   Coronary Diagrams  Diagnostic Dominance: Right  Intervention  Peripheral  Vascular Diagram  Diagnostic   Intervention  Implants     No implant documentation for this case.   Syngo Images   Show images for CARDIAC CATHETERIZATION Images on Long Term Storage   Show images for Drue, Harr to Procedure Log  Procedure Log    Hemo Data  Flowsheet Row Most Recent Value  Fick Cardiac Output 6.36 L/min  Fick Cardiac Output Index 3.45 (L/min)/BSA  RA A Wave 14 mmHg  RA V Wave 10 mmHg  RA Mean 10 mmHg  RV Systolic Pressure 39 mmHg  RV Diastolic Pressure 15 mmHg  RV EDP 13 mmHg  PA Systolic Pressure 40 mmHg  PA Diastolic Pressure 16 mmHg  PA Mean 25 mmHg  PW A Wave 18 mmHg  PW V Wave 16 mmHg  PW Mean 14 mmHg  AO Systolic Pressure 193 mmHg  AO Diastolic Pressure 97 mmHg  AO Mean 97 mmHg  QP/QS 0.87  TPVR Index 8.29 HRUI  TSVR Index 28.13 HRUI  PVR SVR Ratio 0.14  TPVR/TSVR Ratio 0.29    ADDENDUM REPORT: 06/22/2022 21:35   CLINICAL DATA:  30 -year-old male with aortic stenosis being evaluated for a TAVR procedure.   EXAM: Cardiac TAVR CT   TECHNIQUE: The patient was scanned on a [employer]. A 120 kV retrospective scan was triggered in the descending thoracic aorta at 111 HU's. Gantry rotation speed was 250 msecs and collimation was .6 mm. No beta blockade or nitro were given. The 3D data set was reconstructed in 5% intervals of the R-R cycle. Systolic and diastolic phases were analyzed on a dedicated work station using MPR, MIP and VRT modes. The patient received 80 cc of contrast.   FINDINGS: Aortic Root:   Aortic valve: Tricuspid   Aortic valve calcium score: 2634   Aortic annulus:   Diameter: 87m x 245m  Perimeter: 8419m Area: 547 mm^2   Calcifications: No calcifications   Coronary height: Min Left - 41m77max Left - 21mm64mn Right - 21mm 57mnotubular height: Left cusp - 22mm; 78mt cusp - 26mm; N66mronary cusp - 22mm   L110m(as measured 3 mm below the annulus):   Diameter: 32mm x  275m  Are87m74 mm^2   Calcifications: No calcifications   Aortic sinus width: Left cusp - 34mm; Right27mp - 33mm; Noncor35my cusp -34mm   Sinotu49mr junction width: 29mm x 28mm   36mmum 3mroscopic Angle for Delivery: LAO 11 CAU 5   Cardiac:   Right atrium: Moderate enlargement   Right ventricle: Moderate enlargement   Pulmonary arteries: Dilated main PA measuring 31mm   Pulmonary21mns: Normal configuration   Left atrium: Mild enlargement   Left ventricle: Mild enlargement   Pericardium: Normal thickness   Coronary arteries: Calcium score 2766 (92nd percentile)   IMPRESSION: 1. Trileaflet aortic valve with severe calcifications (AV calcium score 2634)   2. Aortic annulus measures 30mm x 25mm in di42mer w62mperimeter 84mm and area 547 m27m No annular or LVOT calcifications. Annular measurements suitable for delivery of 29mm Edwards Sapien 40mlve.   3. Sufficient coronary to annulus distance.   4. Optimum Fluoroscopic Angle for Delivery:  LAO 11 CAU 5   5. Coronary calcium score 2766 (92nd percentile)     Electronically Signed   By: Christopher  SchumannOswaldo Milian3 21:35    Addended by Schumann, ChristopherDonato Heinz38 PM   Study Result  Narrative & Impression  EXAM: OVER-READ INTERPRETATION  CT CHEST   The following report is a limited chest CT over-read performed by radiologist Dr. Leah Strickland of [facility]6/2023. This[place]onaver-read does not include interpretation of cardiac or coronary anatomy or pathology. The cardiac TAVR interpretation by the cardiologist is attached.   COMPARISON:  None Available.  FINDINGS: Extracardiac findings will be described separately under dictation for contemporaneously obtained CTA chest, abdomen and pelvis.   IMPRESSION: Please see separate dictation for contemporaneously obtained CTA chest, abdomen and pelvis dated 06/21/2022 for full description  of relevant extracardiac findings.   Electronically Signed: By: Yetta Glassman M.D. On: 06/21/2022 16:43   Narrative & Impression  CLINICAL DATA:  Preop evaluation for aortic valve replacement   EXAM: CT ANGIOGRAPHY CHEST, ABDOMEN AND PELVIS   TECHNIQUE: Non-contrast CT of the chest was initially obtained.   Multidetector CT imaging through the chest, abdomen and pelvis was performed using the standard protocol during bolus administration of intravenous contrast. Multiplanar reconstructed images and MIPs were obtained and reviewed to evaluate the vascular anatomy.   RADIATION DOSE REDUCTION: This exam was performed according to the departmental dose-optimization program which includes automated exposure control, adjustment of the mA and/or kV according to patient size and/or use of iterative reconstruction technique.   CONTRAST:  62m OMNIPAQUE IOHEXOL 350 MG/ML SOLN   COMPARISON:  None Available.   FINDINGS: CTA CHEST FINDINGS   Cardiovascular: Normal heart size. No pericardial effusion. Left main and three-vessel coronary artery calcifications. Calcifications and thickening of the aortic valve. Normal caliber thoracic aorta with moderate atherosclerotic disease. Standard three-vessel aortic arch with no significant stenosis.   Mediastinum/Nodes: Esophagus and thyroid are unremarkable. No suspicious filling defects of the pulmonary arteries.   Lungs/Pleura: Central airways are patent. No consolidation, pleural effusion or pneumothorax.   Musculoskeletal: No chest wall abnormality. No acute or significant osseous findings.   CTA ABDOMEN AND PELVIS FINDINGS   Hepatobiliary: Scattered low-attenuation liver lesions, compatible with simple cysts. No suspicious liver lesions. Gallbladder is unremarkable. No biliary ductal dilation.   Pancreas: Unremarkable. No pancreatic ductal dilatation or surrounding inflammatory changes.   Spleen: Normal in size without focal  abnormality.   Adrenals/Urinary Tract: Bilateral adrenal glands are unremarkable. No hydronephrosis or nephrolithiasis. Scattered small low-attenuation renal lesions which are too small to completely characterize, but likely simple cysts, no further follow-up imaging recommended. Bladder wall thickening.   Stomach/Bowel: Stomach is within normal limits. Appendix appears normal. Diverticulosis. No evidence of bowel wall thickening, distention, or inflammatory changes.   Vascular/lymphatic: Normal caliber abdominal aorta with severe atherosclerotic disease, no significant stenosis. No pathologically enlarged lymph nodes seen in the abdomen or pelvis.   Reproductive: Prostatomegaly with TURP defect.   Other: No abdominal wall hernia or abnormality. No abdominopelvic ascites.   Musculoskeletal: Prior total left hip replacement. No aggressive appearing osseous lesions.   VASCULAR MEASUREMENTS PERTINENT TO TAVR:   AORTA:   Minimal Aortic Diameter-17.9 mm   Severity of Aortic Calcification-severe   RIGHT PELVIS:   Right Common Iliac Artery -   Minimal Diameter-7.6 mm   Tortuosity-mild   Calcification-severe   Right External Iliac Artery -   Minimal Diameter-7.8 mm   Tortuosity-moderate   Calcification-severe   Right Common Femoral Artery -   Minimal Diameter-4.8 mm   Tortuosity-none   Calcification-severe   LEFT PELVIS:   Left Common Iliac Artery -   Minimal Diameter-9.8 mm   Tortuosity-moderate   Calcification-severe   Left External Iliac Artery -   Minimal Diameter-4.0 mm   Tortuosity-moderate   Calcification-severe   Left Common Femoral Artery -   Minimal Diameter-7.9 mm   Tortuosity-none   Calcification-moderate   Review of the MIP images confirms the above findings.   IMPRESSION: 1. Vascular findings and measurements pertinent to potential TAVR procedure, as detailed above. 2. Thickening and  calcification of the aortic valve,  compatible with reported clinical history of aortic stenosis. 3. Severe aortoiliac atherosclerosis. Left main and 3 vessel coronary artery disease. 4. Bladder wall thickening, likely secondary to chronic outlet obstruction. 5. Diverticulosis evidence of diverticulitis.     Electronically Signed   By: Yetta Glassman M.D.   On: 06/21/2022 18:41     Impression:  This 76 year old gentleman has stage D1, severe, symptomatic aortic stenosis with NYHA class II symptoms of exertional fatigue and shortness of breath consistent with chronic diastolic congestive heart failure.  I have personally reviewed his 2D echocardiogram, cardiac catheterization, and CTA studies.  His echocardiogram shows a moderately calcified and thickened aortic valve with a mean gradient of 38 mmHg and a valve area of 0.62 cm consistent with severe aortic stenosis.  Cardiac catheterization shows mild nonobstructive coronary disease.  I agree that aortic valve replacement is indicated in this patient for relief of his symptoms and to prevent left ventricular dysfunction.  Given his age I think transcatheter aortic valve replacement would be the best option for treating him.  His gated cardiac CTA shows anatomy suitable for TAVR using a SAPIEN 3 valve.  Abdominal and pelvic CTA shows adequate pelvic vascular anatomy to allow transfemoral insertion.  The patient was counseled at length regarding treatment alternatives for management of severe symptomatic aortic stenosis. The risks and benefits of surgical intervention has been discussed in detail. Long-term prognosis with medical therapy was discussed. Alternative approaches such as conventional surgical aortic valve replacement, transcatheter aortic valve replacement, and palliative medical therapy were compared and contrasted at length. This discussion was placed in the context of the patient's own specific clinical presentation and past medical history. All of his questions have  been addressed.   Following the decision to proceed with transcatheter aortic valve replacement, a discussion was held regarding what types of management strategies would be attempted intraoperatively in the event of life-threatening complications, including whether or not the patient would be considered a candidate for the use of cardiopulmonary bypass and/or conversion to open sternotomy for attempted surgical intervention.  I think he would be a candidate for emergent sternotomy to manage any intraoperative complications.  The patient is aware of the fact that transient use of cardiopulmonary bypass may be necessary. The patient has been advised of a variety of complications that might develop including but not limited to risks of death, stroke, paravalvular leak, aortic dissection or other major vascular complications, aortic annulus rupture, device embolization, cardiac rupture or perforation, mitral regurgitation, acute myocardial infarction, arrhythmia, heart block or bradycardia requiring permanent pacemaker placement, congestive heart failure, respiratory failure, renal failure, pneumonia, infection, other late complications related to structural valve deterioration or migration, or other complications that might ultimately cause a temporary or permanent loss of functional independence or other long term morbidity. The patient provides full informed consent for the procedure as described and all questions were answered.      Plan:  He will be scheduled for transfemoral TAVR using a SAPIEN 3 valve on 08/01/2022.  I spent 60 minutes performing this consultation and > 50% of this time was spent face to face counseling and coordinating the care of this patient's severe symptomatic aortic stenosis.   Gaye Pollack, MD 07/26/2022

## 2022-07-27 NOTE — Pre-Procedure Instructions (Signed)
Surgical Instructions    Your procedure is scheduled on Tuesday, September 5th.  Report to [facility] Main Entrance "A" at 08:45  A.M., then check in with the Admitting office.  Call this number if you have problems the morning of surgery:  [phone]   If you have any questions prior to your surgery date call [phone]: Open Monday-Friday 8am-4pm    Remember:  Do not eat or drink after midnight the night before your surgery   STOP now, 8/30, taking any Aleve, Naproxen, Ibuprofen, Motrin, Advil, Goody's, BC's, all herbal medications, fish oil, and all non-prescription vitamins.   Continue taking all other medications including Aspirin without change through the day before surgery. On the morning of surgery do not take any medications.                      Do NOT Smoke (Tobacco/Vaping) for 24 hours prior to your procedure.  If you use a CPAP at night, you may bring your mask/headgear for your overnight stay.   Contacts, glasses, piercing's, hearing aid's, dentures or partials may not be worn into surgery, please bring cases for these belongings.    For patients admitted to the hospital, discharge time will be determined by your treatment team.   Patients discharged the day of surgery will not be allowed to drive home, and someone needs to stay with them for 24 hours.  SURGICAL WAITING ROOM VISITATION Patients having surgery or a procedure may have no more than 2 support people in the waiting area - these visitors may rotate.   Children under the age of 26 must have an adult with them who is not the patient. If the patient needs to stay at the hospital during part of their recovery, the visitor guidelines for inpatient rooms apply. Pre-op nurse will coordinate an appropriate time for 1 support person to accompany patient in pre-op.  This support person may not rotate.   Please refer to the [facility] website for the visitor guidelines for Inpatients (after your surgery is over  and you are in a regular room).    Special instructions:   [place]- Preparing For Surgery  Before surgery, you can play an important role. Because skin is not sterile, your skin needs to be as free of germs as possible. You can reduce the number of germs on your skin by washing with CHG (chlorahexidine gluconate) Soap before surgery.  CHG is an antiseptic cleaner which kills germs and bonds with the skin to continue killing germs even after washing.    Oral Hygiene is also important to reduce your risk of infection.  Remember - BRUSH YOUR TEETH THE MORNING OF SURGERY WITH YOUR REGULAR TOOTHPASTE  Please do not use if you have an allergy to CHG or antibacterial soaps. If your skin becomes reddened/irritated stop using the CHG.  Do not shave (including legs and underarms) for at least 48 hours prior to first CHG shower. It is OK to shave your face.  Please follow these instructions carefully.   Shower the NIGHT BEFORE SURGERY and the MORNING OF SURGERY  If you chose to wash your hair, wash your hair first as usual with your normal shampoo.  After you shampoo, rinse your hair and body thoroughly to remove the shampoo.  Use CHG Soap as you would any other liquid soap. You can apply CHG directly to the skin and wash gently with a scrungie or a clean washcloth.   Apply the CHG Soap  to your body ONLY FROM THE NECK DOWN.  Do not use on open wounds or open sores. Avoid contact with your eyes, ears, mouth and genitals (private parts). Wash Face and genitals (private parts)  with your normal soap.   Wash thoroughly, paying special attention to the area where your surgery will be performed.  Thoroughly rinse your body with warm water from the neck down.  DO NOT shower/wash with your normal soap after using and rinsing off the CHG Soap.  Pat yourself dry with a CLEAN TOWEL.  Wear CLEAN PAJAMAS to bed the night before surgery  Place CLEAN SHEETS on your bed the night before your  surgery  DO NOT SLEEP WITH PETS.   Day of Surgery: Take a shower with CHG soap. Do not wear jewelry  Do not wear lotions, powders, colognes, or deodorant. Do not shave 48 hours prior to surgery.  Men may shave face and neck. Do not bring valuables to the hospital.  [facility] is not responsible for any belongings or valuables.  Wear Clean/Comfortable clothing the morning of surgery Remember to brush your teeth WITH YOUR REGULAR TOOTHPASTE.   Please read over the following fact sheets that you were given.    If you received a COVID test during your pre-op visit  it is requested that you wear a mask when out in public, stay away from anyone that may not be feeling well and notify your surgeon if you develop symptoms. If you have been in contact with anyone that has tested positive in the last 10 days please notify you surgeon.

## 2022-07-28 ENCOUNTER — Encounter (HOSPITAL_COMMUNITY)
Admission: RE | Admit: 2022-07-28 | Discharge: 2022-07-28 | Disposition: A | Discharge status: Home / Self Care | Payer: Medicare Other | Source: Ambulatory Visit | Attending: Internal Medicine | Admitting: Internal Medicine

## 2022-07-28 ENCOUNTER — Ambulatory Visit (HOSPITAL_COMMUNITY)
Admission: RE | Admit: 2022-07-28 | Discharge: 2022-07-28 | Disposition: A | Discharge status: Home / Self Care | Payer: Medicare Other | Source: Ambulatory Visit | Attending: Internal Medicine | Admitting: Internal Medicine

## 2022-07-28 ENCOUNTER — Encounter (HOSPITAL_COMMUNITY): Payer: Self-pay

## 2022-07-28 ENCOUNTER — Other Ambulatory Visit: Payer: Self-pay

## 2022-07-28 DIAGNOSIS — J449 Chronic obstructive pulmonary disease, unspecified: Secondary | ICD-10-CM | POA: Diagnosis not present

## 2022-07-28 DIAGNOSIS — Z01818 Encounter for other preprocedural examination: Secondary | ICD-10-CM | POA: Insufficient documentation

## 2022-07-28 DIAGNOSIS — Z20822 Contact with and (suspected) exposure to covid-19: Secondary | ICD-10-CM | POA: Insufficient documentation

## 2022-07-28 DIAGNOSIS — R001 Bradycardia, unspecified: Secondary | ICD-10-CM | POA: Diagnosis not present

## 2022-07-28 DIAGNOSIS — I350 Nonrheumatic aortic (valve) stenosis: Secondary | ICD-10-CM | POA: Diagnosis not present

## 2022-07-28 HISTORY — DX: Cardiac murmur, unspecified: R01.1

## 2022-07-28 HISTORY — DX: Anxiety disorder, unspecified: F41.9

## 2022-07-28 LAB — TYPE AND SCREEN
ABO/RH(D): A NEG
Antibody Screen: NEGATIVE

## 2022-07-28 LAB — URINALYSIS, ROUTINE W REFLEX MICROSCOPIC
Bacteria, UA: NONE SEEN
Bilirubin Urine: NEGATIVE
Glucose, UA: NEGATIVE mg/dL
Hgb urine dipstick: NEGATIVE
Ketones, ur: NEGATIVE mg/dL
Leukocytes,Ua: NEGATIVE
Nitrite: NEGATIVE
Protein, ur: NEGATIVE mg/dL
Specific Gravity, Urine: 1.021 (ref 1.005–1.030)
pH: 6 (ref 5.0–8.0)

## 2022-07-28 LAB — CBC
HCT: 40.3 % (ref 39.0–52.0)
Hemoglobin: 13.3 g/dL (ref 13.0–17.0)
MCH: 30 pg (ref 26.0–34.0)
MCHC: 33 g/dL (ref 30.0–36.0)
MCV: 90.8 fL (ref 80.0–100.0)
Platelets: 203 10*3/uL (ref 150–400)
RBC: 4.44 MIL/uL (ref 4.22–5.81)
RDW: 13.9 % (ref 11.5–15.5)
WBC: 6.8 10*3/uL (ref 4.0–10.5)
nRBC: 0 % (ref 0.0–0.2)

## 2022-07-28 LAB — SARS CORONAVIRUS 2 (TAT 6-24 HRS): SARS Coronavirus 2: NEGATIVE

## 2022-07-28 LAB — PROTIME-INR
INR: 1 (ref 0.8–1.2)
Prothrombin Time: 13.1 seconds (ref 11.4–15.2)

## 2022-07-28 LAB — COMPREHENSIVE METABOLIC PANEL
ALT: 17 U/L (ref 0–44)
AST: 23 U/L (ref 15–41)
Albumin: 3.7 g/dL (ref 3.5–5.0)
Alkaline Phosphatase: 61 U/L (ref 38–126)
Anion gap: 6 (ref 5–15)
BUN: 14 mg/dL (ref 8–23)
CO2: 29 mmol/L (ref 22–32)
Calcium: 9.1 mg/dL (ref 8.9–10.3)
Chloride: 102 mmol/L (ref 98–111)
Creatinine, Ser: 0.79 mg/dL (ref 0.61–1.24)
GFR, Estimated: >60 mL/min (ref >60)
Glucose, Bld: 101 mg/dL — ABNORMAL HIGH (ref 70–99)
Potassium: 3.5 mmol/L (ref 3.5–5.1)
Sodium: 137 mmol/L (ref 135–145)
Total Bilirubin: 1 mg/dL (ref 0.3–1.2)
Total Protein: 6.7 g/dL (ref 6.5–8.1)

## 2022-07-28 LAB — SURGICAL PCR SCREEN
MRSA, PCR: NEGATIVE
Staphylococcus aureus: POSITIVE — AB

## 2022-07-28 MED ORDER — DEXMEDETOMIDINE HCL IN NACL 400 MCG/100ML IV SOLN
0.1000 ug/kg/h | INTRAVENOUS | Status: AC
  Administered 2022-08-01: 1 ug/kg/h via INTRAVENOUS
  Filled 2022-07-28: qty 100

## 2022-07-28 MED ORDER — NOREPINEPHRINE 4 MG/250ML-% IV SOLN
0.0000 ug/min | INTRAVENOUS | Status: DC
  Filled 2022-07-28: qty 250

## 2022-07-28 MED ORDER — CEFAZOLIN SODIUM-DEXTROSE 2-4 GM/100ML-% IV SOLN
2.0000 g | INTRAVENOUS | Status: AC
  Administered 2022-08-01: 2 g via INTRAVENOUS
  Filled 2022-07-28: qty 100

## 2022-07-28 MED ORDER — MAGNESIUM SULFATE 50 % IJ SOLN
40.0000 meq | INTRAMUSCULAR | Status: DC
  Filled 2022-07-28: qty 9.85

## 2022-07-28 MED ORDER — POTASSIUM CHLORIDE 2 MEQ/ML IV SOLN
80.0000 meq | INTRAVENOUS | Status: DC
  Filled 2022-07-28: qty 40

## 2022-07-28 MED ORDER — HEPARIN 30,000 UNITS/1000 ML (OHS) CELLSAVER SOLUTION
Status: DC
  Filled 2022-07-28: qty 1000

## 2022-07-28 NOTE — Progress Notes (Signed)
PCP - Maury Dus MD Cardiologist - Ali Lowe  PPM/ICD - denies  Chest x-ray - pending EKG - pending Stress Test - denies ECHO - 05/17/22 Cardiac Cath - 05/31/22  Sleep Study - yes, with DX CPAP - yes  No diabetes.  STOP now, 8/30, taking any Aleve, Naproxen, Ibuprofen, Motrin, Advil, Goody's, BC's, all herbal medications, fish oil, and all non-prescription vitamins.   Continue taking all other medications including Aspirin without change through the day before surgery. On the morning of surgery do not take any medications.  ERAS Protcol - n/a PRE-SURGERY Ensure or G2- n/a  COVID TEST- pending   Anesthesia review: yes  Patient denies shortness of breath, fever, cough and chest pain at PAT appointment   All instructions explained to the patient, with a verbal understanding of the material. Patient agrees to go over the instructions while at home for a better understanding. Patient also instructed to self quarantine after being tested for COVID-19. The opportunity to ask questions was provided.

## 2022-07-31 NOTE — H&P (Signed)
[address]       [place] [address]             [phone]      Cardiothoracic Surgery Admission History and Physical   PCP is Maury Dus, MD Referring Provider is Lenna Sciara, MD Primary Cardiologist is None   Reason for admission:  Severe aortic stenosis   HPI:   The patient is a 76 year old gentleman with a history of hypertension, hyperlipidemia, OSA on CPAP, previous smoking and COPD, osteoarthritis and spinal stenosis, and severe aortic stenosis who was referred for consideration of TAVR.  Over the past 6 months he has had increasing shortness of breath with activities around his house and walking his dog.  He has been the primary caregiver for his wife who is suffering from dementia but recently had to put her in an assisted living center because he could not care for her any longer.  He is still able to do his activities of daily living for himself.  A 2D echocardiogram on 05/17/2022 showed a moderately thickened and calcified aortic valve with a mean gradient of 38 mmHg and a dimensionless index of 0.2 with a valve area by VTI of 0.62 cm.  Left ventricular ejection fraction was 55 to 60%.  There was trivial mitral regurgitation.  Cardiac catheterization was performed on 05/31/2022 and showed mild nonobstructive coronary disease.   He quit smoking over 25 years ago.  He does see a dentist regularly.  He has 2 adult children.       Past Medical History:  Diagnosis Date   AAA (abdominal aortic aneurysm) ([facility]) 01/29/2017    2.9cm   Allergic rhinitis     Arthritis      l ankle   BPH (benign prostatic hypertrophy)     Cancer (HCC)      HX SKIN CANCER   Cataract, bilateral     Chronic back pain     Chronic left hip pain     Constipation     Depression     Dyspnea      with exertion   Essential hypertension     GERD (gastroesophageal reflux disease)     History of chronic sinusitis     History of dizziness 03/2016   History of skin cancer      Hyperlipidemia     Hypokalemia 03/2016   Insomnia     Neck pain, chronic     Nocturia     OSA on CPAP      uses cpap   Pedal edema      Chronic   Postherpetic neuralgia     Shingles             Past Surgical History:  Procedure Laterality Date   ABDOMINAL AORTOGRAM N/A 05/31/2022    Procedure: ABDOMINAL AORTOGRAM;  Surgeon: Early Osmond, MD;  Location: [facility];  Service: Cardiovascular;  Laterality: N/A;   ANKLE ARTHROSCOPY Left 03/ 2012   CATARACT EXTRACTION, BILATERAL       COLONOSCOPY       KNEE ARTHROSCOPY Left 04-19-2011   KNEE ARTHROSCOPY WITH MEDIAL MENISECTOMY Right 05/18/2015    Procedure: RIGHT KNEE ARTHROSCOPY WITH  MEDIAL MENISCAL DEBRIDEMENT;  Surgeon: Gaynelle Arabian, MD;  Location: [facility];  Service: Orthopedics;  Laterality: Right;   pilonidal fistulectomy       RIGHT HEART CATH AND CORONARY ANGIOGRAPHY N/A 05/31/2022    Procedure: RIGHT HEART CATH AND  CORONARY ANGIOGRAPHY;  Surgeon: Early Osmond, MD;  Location: [facility];  Service: Cardiovascular;  Laterality: N/A;   TOTAL HIP ARTHROPLASTY Left 12/12/2017    Procedure: LEFT TOTAL HIP ARTHROPLASTY ANTERIOR APPROACH;  Surgeon: Gaynelle Arabian, MD;  Location: [facility];  Service: Orthopedics;  Laterality: Left;   TRANSURETHRAL RESECTION OF PROSTATE       TRANSURETHRAL RESECTION OF PROSTATE N/A 06/25/2015    Procedure: TRANSURETHRAL RESECTION OF THE PROSTATE WITH GYRUS INSTRUMENTS BLADDER BIOPSY AND FULGURATION;  Surgeon: Cleon Gustin, MD;  Location: [facility];  Service: Urology;  Laterality: N/A;           Family History  Problem Relation Age of Onset   Hypercholesterolemia Mother     Stroke Mother          diagnosed w/DM,CVD   CVA Father     Seizures Father        Social History         Socioeconomic History   Marital status: Married      Spouse name: Not on file   Number of children: Not on file   Years of education: Not on file   Highest education level: Not  on file  Occupational History   Not on file  Tobacco Use   Smoking status: Former      Types: Cigarettes      Quit date: 05/10/1994      Years since quitting: 28.2   Smokeless tobacco: Never  Vaping Use   Vaping Use: Never used  Substance and Sexual Activity   Alcohol use: Yes      Alcohol/week: 3.0 standard drinks of alcohol      Types: 3 Shots of liquor per week      Comment: 7 DRINKS PER WEEK   Drug use: No   Sexual activity: Not on file  Other Topics Concern   Not on file  Social History Narrative   Not on file    Social Determinants of Health    Financial Resource Strain: Not on file  Food Insecurity: Not on file  Transportation Needs: Not on file  Physical Activity: Not on file  Stress: Not on file  Social Connections: Not on file  Intimate Partner Violence: Not on file             Prior to Admission medications   Medication Sig Start Date End Date Taking? Authorizing Provider  acetaminophen (TYLENOL) 650 MG CR tablet Take 1,300 mg by mouth every 8 (eight) hours as needed for pain.     Yes [provider]  amLODipine (NORVASC) 5 MG tablet Take 5 mg by mouth daily.     Yes [provider]  aspirin EC 81 MG tablet Take 1 tablet (81 mg total) by mouth daily. Swallow whole. 05/22/22   Yes Early Osmond, MD  budesonide-formoterol [facility]) 160-4.5 MCG/ACT inhaler Inhale 2 puffs into the lungs 2 (two) times daily.     Yes [provider]  Cholecalciferol (VITAMIN D3) 50 MCG (2000 UT) capsule Take 2,000 Units by mouth daily.     Yes [provider]  doxazosin (CARDURA) 8 MG tablet Take 8 mg by mouth at bedtime.     Yes [provider]  famotidine (PEPCID) 40 MG tablet Take 40 mg by mouth daily.     Yes [provider]  fluticasone (FLONASE) 50 MCG/ACT nasal spray Place 2 sprays into both nostrils at bedtime.     Yes [provider]  lisinopril (PRINIVIL,ZESTRIL) 40 MG tablet Take 40 mg by mouth daily.      Yes [provider]  Melatonin 10 MG TABS Take 20 mg by mouth at bedtime.     Yes [provider]  Multiple Vitamin (MULTIVITAMIN) capsule Take 1 capsule by mouth daily.     Yes [provider]  omeprazole (PRILOSEC) 20 MG capsule Take 20 mg by mouth every morning.      Yes [provider]  potassium chloride SA (K-DUR,KLOR-CON) 20 MEQ tablet Take 20 mEq by mouth daily.      Yes [provider]  pravastatin (PRAVACHOL) 80 MG tablet Take 80 mg by mouth every evening.      Yes [provider]  Psyllium (METAMUCIL) 28.3 % POWD Take 10 mLs by mouth daily.     Yes [provider]  spironolactone (ALDACTONE) 25 MG tablet Take 6.25 mg by mouth daily. Pt takes 1/4 tablet every day     Yes [provider]  tiZANidine (ZANAFLEX) 4 MG capsule Take 1 capsule (4 mg total) by mouth 3 (three) times daily. 12/13/17   Yes Perkins, Alexzandrew L, PA-C  traMADol (ULTRAM) 50 MG tablet Take 1-2 tablets (50-100 mg total) by mouth every 6 (six) hours as needed (mild pain). Patient taking differently: Take 50-100 mg by mouth every 6 (six) hours as needed (Back pain). 12/13/17   Yes Perkins, Alexzandrew L, PA-C  vitamin E 180 MG (400 UNITS) capsule Take 400 Units by mouth daily.     Yes [provider]  zolpidem (AMBIEN CR) 12.5 MG CR tablet Take 12.5 mg by mouth at bedtime.     Yes [provider]            Current Outpatient Medications  Medication Sig Dispense Refill   acetaminophen (TYLENOL) 650 MG CR tablet Take 1,300 mg by mouth every 8 (eight) hours as needed for pain.       amLODipine (NORVASC) 5 MG tablet Take 5 mg by mouth daily.       aspirin EC 81 MG tablet Take 1 tablet (81 mg total) by mouth daily. Swallow whole. 90 tablet 3   budesonide-formoterol (SYMBICORT) 160-4.5 MCG/ACT inhaler Inhale 2 puffs into the lungs 2 (two) times daily.       Cholecalciferol (VITAMIN D3) 50 MCG (2000 UT) capsule Take 2,000 Units by mouth  daily.       doxazosin (CARDURA) 8 MG tablet Take 8 mg by mouth at bedtime.       famotidine (PEPCID) 40 MG tablet Take 40 mg by mouth daily.       fluticasone (FLONASE) 50 MCG/ACT nasal spray Place 2 sprays into both nostrils at bedtime.       lisinopril (PRINIVIL,ZESTRIL) 40 MG tablet Take 40 mg by mouth daily.       Melatonin 10 MG TABS Take 20 mg by mouth at bedtime.       Multiple Vitamin (MULTIVITAMIN) capsule Take 1 capsule by mouth daily.       omeprazole (PRILOSEC) 20 MG capsule Take 20 mg by mouth every morning.        potassium chloride SA (K-DUR,KLOR-CON) 20 MEQ tablet Take 20 mEq by mouth daily.        pravastatin (PRAVACHOL) 80 MG tablet Take 80 mg by mouth every evening.        Psyllium (METAMUCIL) 28.3 % POWD Take 10 mLs by mouth daily.       spironolactone (ALDACTONE) 25 MG tablet  Take 6.25 mg by mouth daily. Pt takes 1/4 tablet every day       tiZANidine (ZANAFLEX) 4 MG capsule Take 1 capsule (4 mg total) by mouth 3 (three) times daily. 90 capsule 0   traMADol (ULTRAM) 50 MG tablet Take 1-2 tablets (50-100 mg total) by mouth every 6 (six) hours as needed (mild pain). (Patient taking differently: Take 50-100 mg by mouth every 6 (six) hours as needed (Back pain).) 56 tablet 0   vitamin E 180 MG (400 UNITS) capsule Take 400 Units by mouth daily.       zolpidem (AMBIEN CR) 12.5 MG CR tablet Take 12.5 mg by mouth at bedtime.        No current facility-administered medications for this visit.           Allergies  Allergen Reactions   Compazine [Prochlorperazine Edisylate] Anaphylaxis and Other (See Comments)      Pt states "any nausea med ending in "zine"  Seizures   Cymbalta [Duloxetine Hcl] Other (See Comments)      Urinary retention    Promethazine Anaphylaxis and Other (See Comments)      seizures   Codeine Other (See Comments)      constipation   Other Other (See Comments)      All antiemetic meds ending -zine  Prefers to not take any narcotic pain meds           Review of Systems:               General:                      normal appetite, + decreased energy, no weight gain, no weight loss, no fever             Cardiac:                       no chest pain with exertion, no chest pain at rest, +SOB with moderate exertion, no resting SOB, no PND, no orthopnea, no palpitations, no arrhythmia, no atrial fibrillation, no LE edema, no dizzy spells, no syncope             Respiratory:                 + exertional shortness of breath, no home oxygen, no productive cough, no dry cough, no bronchitis, no wheezing, no hemoptysis, no asthma, no pain with inspiration or cough, + sleep apnea, + CPAP at night             GI:                               no difficulty swallowing, no reflux, + frequent heartburn, no hiatal hernia, no abdominal pain, no constipation, no diarrhea, no hematochezia, no hematemesis, no melena             GU:                              no dysuria,  + frequency, no urinary tract infection, no hematuria, no enlarged prostate, no kidney stones, no kidney disease             Vascular:                     no pain suggestive of claudication, no  pain in feet, no leg cramps, no varicose veins, no DVT, no non-healing foot ulcer             Neuro:                         no stroke, no TIA's, no seizures, no headaches, no temporary blindness one eye,  no slurred speech, no peripheral neuropathy, no chronic pain, + instability of gait, no memory/cognitive dysfunction             Musculoskeletal:         No arthritis, no joint swelling, no myalgias, + difficulty walking, + decreased mobility              Skin:                            no rash, no itching, no skin infections, no pressure sores or ulcerations             Psych:                         no anxiety, no depression, no nervousness, no unusual recent stress             Eyes:                           no blurry vision, no floaters, no recent vision changes, + wears glasses            ENT:                             no hearing loss, no loose or painful teeth, no dentures, last saw dentist recently             Hematologic:               no easy bruising, no abnormal bleeding, no clotting disorder, no frequent epistaxis             Endocrine:                   no diabetes, does not check CBG's at home                            Physical Exam:               BP 135/74 (BP Location: Right Arm, Patient Position: Sitting)   Pulse (!) 59   Resp 18   Ht '5\' 4"'$  (1.626 m)   SpO2 94% Comment: RA  BMI 29.70 kg/m              General:                      Elderly,  well-appearing             HEENT:                       Unremarkable, NCAT, PERLA, EOMI             Neck:                           no JVD, no bruits, no adenopathy  Chest:                          clear to auscultation, symmetrical breath sounds, no wheezes, no rhonchi              CV:                              RRR, 3/6 systolic murmur RSB, no diastolic murmur             Abdomen:                    soft, non-tender, no masses              Extremities:                 warm, well-perfused, pulses palpable at ankles, no lower extremity edema             Rectal/GU                   Deferred             Neuro:                         Grossly non-focal and symmetrical throughout             Skin:                            Clean and dry, no rashes, no breakdown   Diagnostic Tests:   ECHOCARDIOGRAM REPORT         Patient Name:   Mark Zamora Date of Exam: 05/17/2022  Medical Rec #:  [id number]          Height:       64.0 in  Accession #:    [id number]         Weight:       206.0 lb  Date of Birth:  05/24/1946         BSA:          1.981 m  Patient Age:    1 years           BP:           172/90 mmHg  Patient Gender: M                  HR:           49 bpm.  Exam Location:  [facility]   Procedure: 2D Echo, Cardiac Doppler, Color Doppler and Strain Analysis   Indications:    I35.0 Aortic Stenosis      History:        Patient has no prior history of Echocardiogram  examinations. No                  cardiac history.     Sonographer:    Marygrace Drought RCS  Referring Phys: [id number] [place]     1. Left ventricular ejection fraction, by estimation, is 55 to 60%. The  left ventricle has normal function. The left ventricle demonstrates  regional wall motion abnormalities (see scoring diagram/findings for  description). Left ventricular diastolic  parameters are indeterminate. There is mild hypokinesis of the left  ventricular, basal inferolateral wall and inferior wall. The  average left  ventricular global longitudinal strain is -16.6 %. The global longitudinal  strain is normal.   2. Right ventricular systolic function is normal. The right ventricular  size is normal. There is normal pulmonary artery systolic pressure.   3. Left atrial size was mild to moderately dilated.   4. The mitral valve is normal in structure. Trivial mitral valve  regurgitation. No evidence of mitral stenosis.   5. The aortic valve has an indeterminant number of cusps. There is  moderate calcification of the aortic valve. There is moderate thickening  of the aortic valve. Aortic valve regurgitation is not visualized. Severe  aortic valve stenosis. Aortic valve  area, by VTI measures 0.62 cm. Aortic valve mean gradient measures 38.0  mmHg. Aortic valve Vmax measures 3.87 m/s.   6. The inferior vena cava is normal in size with greater than 50%  respiratory variability, suggesting right atrial pressure of 3 mmHg.   Comparison(s): No prior Echocardiogram.   Conclusion(s)/Recommendation(s): Likely severe aortic stenosis--moderate  by gradient/velocity, but severe by DI and AVA.   FINDINGS   Left Ventricle: Left ventricular ejection fraction, by estimation, is 55  to 60%. The left ventricle has normal function. The left ventricle  demonstrates regional wall motion abnormalities. Mild  hypokinesis of the  left ventricular, basal inferolateral  wall and inferior wall. The average left ventricular global longitudinal  strain is -16.6 %. The global longitudinal strain is normal. The left  ventricular internal cavity size was normal in size. There is no left  ventricular hypertrophy. Left ventricular   diastolic parameters are indeterminate.   Right Ventricle: The right ventricular size is normal. No increase in  right ventricular wall thickness. Right ventricular systolic function is  normal. There is normal pulmonary artery systolic pressure. The tricuspid  regurgitant velocity is 1.81 m/s, and   with an assumed right atrial pressure of 3 mmHg, the estimated right  ventricular systolic pressure is 44.8 mmHg.   Left Atrium: Left atrial size was mild to moderately dilated.   Right Atrium: Right atrial size was normal in size.   Pericardium: Trivial pericardial effusion is present.   Mitral Valve: The mitral valve is normal in structure. There is mild  thickening of the mitral valve leaflet(s). There is mild calcification of  the mitral valve leaflet(s). Mild mitral annular calcification. Trivial  mitral valve regurgitation. No evidence   of mitral valve stenosis.   Tricuspid Valve: The tricuspid valve is normal in structure. Tricuspid  valve regurgitation is trivial. No evidence of tricuspid stenosis.   Aortic Valve: The aortic valve has an indeterminant number of cusps. There  is moderate calcification of the aortic valve. There is moderate  thickening of the aortic valve. Aortic valve regurgitation is not  visualized. Severe aortic stenosis is present.  Aortic valve mean gradient measures 38.0 mmHg. Aortic valve peak gradient  measures 59.9 mmHg. Aortic valve area, by VTI measures 0.62 cm.   Pulmonic Valve: The pulmonic valve was grossly normal. Pulmonic valve  regurgitation is trivial. No evidence of pulmonic stenosis.   Aorta: The aortic root, ascending  aorta, aortic arch and descending aorta  are all structurally normal, with no evidence of dilitation or  obstruction.   Venous: The inferior vena cava is normal in size with greater than 50%  respiratory variability, suggesting right atrial pressure of 3 mmHg.   IAS/Shunts: The atrial septum is grossly normal.      LEFT VENTRICLE  PLAX 2D  LVIDd:  5.40 cm   Diastology  LVIDs:         3.70 cm   LV e' medial:    5.00 cm/s  LV PW:         0.90 cm   LV E/e' medial:  17.2  LV IVS:        1.20 cm   LV e' lateral:   7.83 cm/s  LVOT diam:     2.00 cm   LV E/e' lateral: 11.0  LV SV:         69  LV SV Index:   35        2D Longitudinal Strain  LVOT Area:     3.14 cm  2D Strain GLS (A2C):   -17.5 %                           2D Strain GLS (A3C):   -15.2 %                           2D Strain GLS (A4C):   -17.0 %                           2D Strain GLS Avg:     -16.6 %   RIGHT VENTRICLE  RV Basal diam:  3.60 cm  RV S prime:     14.50 cm/s  TAPSE (M-mode): 2.5 cm  RVSP:           16.1 mmHg   LEFT ATRIUM             Index        RIGHT ATRIUM           Index  LA diam:        4.30 cm 2.17 cm/m   RA Pressure: 3.00 mmHg  LA Vol (A2C):   61.1 ml 30.84 ml/m  RA Area:     16.30 cm  LA Vol (A4C):   60.2 ml 30.39 ml/m  RA Volume:   42.40 ml  21.40 ml/m  LA Biplane Vol: 63.2 ml 31.90 ml/m   AORTIC VALVE  AV Area (Vmax):    0.67 cm  AV Area (Vmean):   0.55 cm  AV Area (VTI):     0.62 cm  AV Vmax:           387.00 cm/s  AV Vmean:          291.000 cm/s  AV VTI:            1.120 m  AV Peak Grad:      59.9 mmHg  AV Mean Grad:      38.0 mmHg  LVOT Vmax:         82.20 cm/s  LVOT Vmean:        50.600 cm/s  LVOT VTI:          0.221 m  LVOT/AV VTI ratio: 0.20     AORTA  Ao Root diam: 3.30 cm  Ao Asc diam:  3.60 cm   MITRAL VALVE                TRICUSPID VALVE  MV Area (PHT):              TR Peak grad:   13.1 mmHg  MV Decel Time:  TR Vmax:        181.00 cm/s  MV E  velocity: 85.90 cm/s   Estimated RAP:  3.00 mmHg  MV A velocity: 123.00 cm/s  RVSP:           16.1 mmHg  MV E/A ratio:  0.70                              SHUNTS                              Systemic VTI:  0.22 m                              Systemic Diam: 2.00 cm   Buford Dresser MD  Electronically signed by Buford Dresser MD  Signature Date/Time: 05/17/2022/6:55:48 PM         Final       Physicians   Panel Physicians Referring Physician Case Authorizing Physician  Early Osmond, MD (Primary)        Procedures   ABDOMINAL AORTOGRAM  RIGHT HEART CATH AND CORONARY ANGIOGRAPHY    Conclusion   1.  Mild obstructive coronary artery disease. 2.  Cardiac output of 6.3 L/min and index of 3.4 L/min with a mean RA pressure of 10 mmHg and a mean wedge pressure of 14 mmHg. 3.  Capacious iliofemoral vessels bilaterally.   Recommendation: Continue evaluation for aortic valve intervention.   Indications   Nonrheumatic aortic valve stenosis [I35.0 (ICD-10-CM)]    Clinical Presentation   CHF/Shock Congestive heart failure not present. No shock present.    Procedural Details   Technical Details The patient is a 76 year old male with a history of severe symptomatic aortic stenosis, COPD, osteoarthritis, and spinal stenosis was evaluated in the outpatient setting due to dyspnea and exertional angina.  He is referred for right heart catheterization and coronary angiography study for preprocedural assessment.  Ultrasound was used to gain access to the right internal jugular vein and a 5 French sheath was placed due to the inability to exchange for sheath in the antecubital vein. Ultrasound was then used to gain access to the right radial artery and a 6 French femoral glide sheath was placed there.  5000 units heparin and 5 mg of verapamil were administered through the sheath.  Right heart catheterization, coronary angiography, and abdominal aortography was performed with  standard catheters.  There were no acute complications.  A TR band was placed and manual pressure applied to the right internal jugular site. Estimated blood loss <50 mL.   During this procedure medications were administered to achieve and maintain moderate conscious sedation while the patient's heart rate, blood pressure, and oxygen saturation were continuously monitored and I was present face-to-face 100% of this time.    Medications (Filter: Administrations occurring from 0749 to 0853 on 05/31/22) fentaNYL (SUBLIMAZE) injection (mcg) Total dose:  50 mcg  Date/Time Rate/Dose/Volume Action    05/31/22 0752 25 mcg Given    0804 25 mcg Given      midazolam (VERSED) injection (mg) Total dose:  2 mg  Date/Time Rate/Dose/Volume Action    05/31/22 0753 1 mg Given    0804 1 mg Given      lidocaine (PF) (XYLOCAINE) 1 % injection (mL) Total volume:  10 mL  Date/Time Rate/Dose/Volume Action    05/31/22 0758  5 mL Given    0758 5 mL Given      Radial Cocktail/Verapamil only (mL) Total volume:  10 mL  Date/Time Rate/Dose/Volume Action    05/31/22 0806 10 mL Given      heparin sodium (porcine) injection (Units) Total dose:  5,000 Units  Date/Time Rate/Dose/Volume Action    05/31/22 0806 5,000 Units Given      Heparin (Porcine) in NaCl 1000-0.9 UT/500ML-% SOLN (mL) Total volume:  1,000 mL  Date/Time Rate/Dose/Volume Action    05/31/22 0835 500 mL Given    0835 500 mL Given      iohexol (OMNIPAQUE) 350 MG/ML injection (mL) Total volume:  85 mL  Date/Time Rate/Dose/Volume Action    05/31/22 0835 85 mL Given      Sedation Time   Sedation Time Physician-1: 40 minutes 26 seconds Contrast   Medication Name Total Dose  iohexol (OMNIPAQUE) 350 MG/ML injection 85 mL    Radiation/Fluoro   Fluoro time: 5.8 (min) DAP: 13310 (mGycm2) Cumulative Air Kerma: 469 (mGy) Complications      Complications documented before study signed (05/31/2022  6:29 AM)     No complications  were associated with this study.  Documented by Stacy Gardner, RN - 05/31/2022  8:43 AM      Wound Indication   Wound Assessment No wound present.    Coronary Findings   Diagnostic Dominance: Right Left Anterior Descending  There is mild diffuse disease throughout the vessel.    Left Circumflex  The vessel exhibits minimal luminal irregularities.    Right Coronary Artery  There is mild diffuse disease throughout the vessel.    Intervention    No interventions have been documented.    Peripheral Vascular Findings   Diagnostic   Left External Iliac  The vessel exhibits minimal luminal irregularities.    Right External Iliac  The vessel exhibits minimal luminal irregularities.    Intervention    No interventions have been documented.    Coronary Diagrams   Diagnostic Dominance: Right  Intervention   Peripheral Vascular Diagram   Diagnostic    Intervention   Implants      No implant documentation for this case.    [employer] Images    Show images for CARDIAC CATHETERIZATION Images on Long Term Storage    Show images for Jermany, Rimel to Procedure Log   Procedure Log    Hemo Data   Flowsheet Row Most Recent Value  Fick Cardiac Output 6.36 L/min  Fick Cardiac Output Index 3.45 (L/min)/BSA  RA A Wave 14 mmHg  RA V Wave 10 mmHg  RA Mean 10 mmHg  RV Systolic Pressure 39 mmHg  RV Diastolic Pressure 15 mmHg  RV EDP 13 mmHg  PA Systolic Pressure 40 mmHg  PA Diastolic Pressure 16 mmHg  PA Mean 25 mmHg  PW A Wave 18 mmHg  PW V Wave 16 mmHg  PW Mean 14 mmHg  AO Systolic Pressure 528 mmHg  AO Diastolic Pressure 97 mmHg  AO Mean 97 mmHg  QP/QS 0.87  TPVR Index 8.29 HRUI  TSVR Index 28.13 HRUI  PVR SVR Ratio 0.14  TPVR/TSVR Ratio 0.29      ADDENDUM REPORT: 06/22/2022 21:35   CLINICAL DATA:  38 -year-old male with aortic stenosis being evaluated for a TAVR procedure.   EXAM: Cardiac TAVR CT   TECHNIQUE: The patient was scanned  on a [employer]. A 120 kV retrospective scan was triggered in the descending thoracic aorta at  111 HU's. Gantry rotation speed was 250 msecs and collimation was .6 mm. No beta blockade or nitro were given. The 3D data set was reconstructed in 5% intervals of the R-R cycle. Systolic and diastolic phases were analyzed on a dedicated work station using MPR, MIP and VRT modes. The patient received 80 cc of contrast.   FINDINGS: Aortic Root:   Aortic valve: Tricuspid   Aortic valve calcium score: 2634   Aortic annulus:   Diameter: 24m x 268m  Perimeter: 8462m Area: 547 mm^2   Calcifications: No calcifications   Coronary height: Min Left - 80m33max Left - 21mm48mn Right - 21mm 66mnotubular height: Left cusp - 22mm; 10mt cusp - 26mm; N62mronary cusp - 22mm   L67m(as measured 3 mm below the annulus):   Diameter: 32mm x 248m Are44m74 mm^2   Calcifications: No calcifications   Aortic sinus width: Left cusp - 34mm; Right62mp - 33mm; Noncor20my cusp -34mm   Sinotu7mr junction width: 29mm x 28mm   24mmum 71mroscopic Angle for Delivery: LAO 11 CAU 5   Cardiac:   Right atrium: Moderate enlargement   Right ventricle: Moderate enlargement   Pulmonary arteries: Dilated main PA measuring 31mm   Pulmonary30mns: Normal configuration   Left atrium: Mild enlargement   Left ventricle: Mild enlargement   Pericardium: Normal thickness   Coronary arteries: Calcium score 2766 (92nd percentile)   IMPRESSION: 1. Trileaflet aortic valve with severe calcifications (AV calcium score 2634)   2. Aortic annulus measures 30mm x 25mm in di31mer w61mperimeter 84mm and area 547 m5m No annular or LVOT calcifications. Annular measurements suitable for delivery of 29mm Edwards Sapien 31mlve.   3. Sufficient coronary to annulus distance.   4. Optimum Fluoroscopic Angle for Delivery:  LAO 11 CAU 5   5. Coronary calcium score 2766 (92nd percentile)      Electronically Signed   By: Christopher  SchumannOswaldo Milian3 21:35    Addended by Schumann, ChristopherDonato Heinz38 PM    Study Result   Narrative & Impression  EXAM: OVER-READ INTERPRETATION  CT CHEST   The following report is a limited chest CT over-read performed by radiologist Dr. Leah Strickland of [facility]. [place] does not include interpretation of cardiac or coronary anatomy or pathology. The cardiac TAVR interpretation by the cardiologist is attached.   COMPARISON:  None Available.   FINDINGS: Extracardiac findings will be described separately under dictation for contemporaneously obtained CTA chest, abdomen and pelvis.   IMPRESSION: Please see separate dictation for contemporaneously obtained CTA chest, abdomen and pelvis dated 06/21/2022 for full description of relevant extracardiac findings.   Electronically Signed: By: Leah  Strickland M.D.Yetta Glassman16:43    Narrative & Impression  CLINICAL DATA:  Preop evaluation for aortic valve replacement   EXAM: CT ANGIOGRAPHY CHEST, ABDOMEN AND PELVIS   TECHNIQUE: Non-contrast CT of the chest was initially obtained.   Multidetector CT imaging through the chest, abdomen and pelvis was performed using the standard protocol during bolus administration of intravenous contrast. Multiplanar reconstructed images and MIPs were obtained and reviewed to evaluate the vascular anatomy.   RADIATION DOSE REDUCTION: This exam was performed according to the departmental dose-optimization program which includes automated exposure control, adjustment of the mA and/or kV according to patient size and/or use of iterative reconstruction technique.   CONTRAST:  95mL OMNIPAQUE IOHEXO28m0 MG/ML SOLN  COMPARISON:  None Available.   FINDINGS: CTA CHEST FINDINGS   Cardiovascular: Normal heart size. No pericardial effusion. Left main and three-vessel coronary  artery calcifications. Calcifications and thickening of the aortic valve. Normal caliber thoracic aorta with moderate atherosclerotic disease. Standard three-vessel aortic arch with no significant stenosis.   Mediastinum/Nodes: Esophagus and thyroid are unremarkable. No suspicious filling defects of the pulmonary arteries.   Lungs/Pleura: Central airways are patent. No consolidation, pleural effusion or pneumothorax.   Musculoskeletal: No chest wall abnormality. No acute or significant osseous findings.   CTA ABDOMEN AND PELVIS FINDINGS   Hepatobiliary: Scattered low-attenuation liver lesions, compatible with simple cysts. No suspicious liver lesions. Gallbladder is unremarkable. No biliary ductal dilation.   Pancreas: Unremarkable. No pancreatic ductal dilatation or surrounding inflammatory changes.   Spleen: Normal in size without focal abnormality.   Adrenals/Urinary Tract: Bilateral adrenal glands are unremarkable. No hydronephrosis or nephrolithiasis. Scattered small low-attenuation renal lesions which are too small to completely characterize, but likely simple cysts, no further follow-up imaging recommended. Bladder wall thickening.   Stomach/Bowel: Stomach is within normal limits. Appendix appears normal. Diverticulosis. No evidence of bowel wall thickening, distention, or inflammatory changes.   Vascular/lymphatic: Normal caliber abdominal aorta with severe atherosclerotic disease, no significant stenosis. No pathologically enlarged lymph nodes seen in the abdomen or pelvis.   Reproductive: Prostatomegaly with TURP defect.   Other: No abdominal wall hernia or abnormality. No abdominopelvic ascites.   Musculoskeletal: Prior total left hip replacement. No aggressive appearing osseous lesions.   VASCULAR MEASUREMENTS PERTINENT TO TAVR:   AORTA:   Minimal Aortic Diameter-17.9 mm   Severity of Aortic Calcification-severe   RIGHT PELVIS:   Right Common Iliac  Artery -   Minimal Diameter-7.6 mm   Tortuosity-mild   Calcification-severe   Right External Iliac Artery -   Minimal Diameter-7.8 mm   Tortuosity-moderate   Calcification-severe   Right Common Femoral Artery -   Minimal Diameter-4.8 mm   Tortuosity-none   Calcification-severe   LEFT PELVIS:   Left Common Iliac Artery -   Minimal Diameter-9.8 mm   Tortuosity-moderate   Calcification-severe   Left External Iliac Artery -   Minimal Diameter-4.0 mm   Tortuosity-moderate   Calcification-severe   Left Common Femoral Artery -   Minimal Diameter-7.9 mm   Tortuosity-none   Calcification-moderate   Review of the MIP images confirms the above findings.   IMPRESSION: 1. Vascular findings and measurements pertinent to potential TAVR procedure, as detailed above. 2. Thickening and calcification of the aortic valve, compatible with reported clinical history of aortic stenosis. 3. Severe aortoiliac atherosclerosis. Left main and 3 vessel coronary artery disease. 4. Bladder wall thickening, likely secondary to chronic outlet obstruction. 5. Diverticulosis evidence of diverticulitis.     Electronically Signed   By: Yetta Glassman M.D.   On: 06/21/2022 18:41      Impression:   This 76 year old gentleman has stage D1, severe, symptomatic aortic stenosis with NYHA class II symptoms of exertional fatigue and shortness of breath consistent with chronic diastolic congestive heart failure.  I have personally reviewed his 2D echocardiogram, cardiac catheterization, and CTA studies.  His echocardiogram shows a moderately calcified and thickened aortic valve with a mean gradient of 38 mmHg and a valve area of 0.62 cm consistent with severe aortic stenosis.  Cardiac catheterization shows mild nonobstructive coronary disease.  I agree that aortic valve replacement is indicated in this patient for relief of his symptoms and to prevent left ventricular dysfunction.  Given  his  age I think transcatheter aortic valve replacement would be the best option for treating him.  His gated cardiac CTA shows anatomy suitable for TAVR using a SAPIEN 3 valve.  Abdominal and pelvic CTA shows adequate pelvic vascular anatomy to allow transfemoral insertion.   The patient was counseled at length regarding treatment alternatives for management of severe symptomatic aortic stenosis. The risks and benefits of surgical intervention has been discussed in detail. Long-term prognosis with medical therapy was discussed. Alternative approaches such as conventional surgical aortic valve replacement, transcatheter aortic valve replacement, and palliative medical therapy were compared and contrasted at length. This discussion was placed in the context of the patient's own specific clinical presentation and past medical history. All of his questions have been addressed.    Following the decision to proceed with transcatheter aortic valve replacement, a discussion was held regarding what types of management strategies would be attempted intraoperatively in the event of life-threatening complications, including whether or not the patient would be considered a candidate for the use of cardiopulmonary bypass and/or conversion to open sternotomy for attempted surgical intervention.  I think he would be a candidate for emergent sternotomy to manage any intraoperative complications.  The patient is aware of the fact that transient use of cardiopulmonary bypass may be necessary. The patient has been advised of a variety of complications that might develop including but not limited to risks of death, stroke, paravalvular leak, aortic dissection or other major vascular complications, aortic annulus rupture, device embolization, cardiac rupture or perforation, mitral regurgitation, acute myocardial infarction, arrhythmia, heart block or bradycardia requiring permanent pacemaker placement, congestive heart failure,  respiratory failure, renal failure, pneumonia, infection, other late complications related to structural valve deterioration or migration, or other complications that might ultimately cause a temporary or permanent loss of functional independence or other long term morbidity. The patient provides full informed consent for the procedure as described and all questions were answered.       Plan:   Transfemoral TAVR using a SAPIEN 3 valve.       Gaye Pollack, MD

## 2022-08-01 ENCOUNTER — Encounter (HOSPITAL_COMMUNITY): Payer: Self-pay | Admitting: Internal Medicine

## 2022-08-01 ENCOUNTER — Inpatient Hospital Stay (HOSPITAL_COMMUNITY)
Admission: RE | Admit: 2022-08-01 | Discharge: 2022-08-03 | DRG: 267 | Disposition: A | Discharge status: Home / Self Care | Payer: Medicare Other | Attending: Internal Medicine | Admitting: Internal Medicine

## 2022-08-01 ENCOUNTER — Other Ambulatory Visit: Payer: Self-pay

## 2022-08-01 ENCOUNTER — Encounter (HOSPITAL_COMMUNITY)
Admission: RE | Disposition: A | Discharge status: Home / Self Care | Payer: Self-pay | Source: Home / Self Care | Attending: Internal Medicine

## 2022-08-01 ENCOUNTER — Inpatient Hospital Stay (HOSPITAL_COMMUNITY): Payer: Medicare Other | Admitting: Certified Registered Nurse Anesthetist

## 2022-08-01 ENCOUNTER — Inpatient Hospital Stay (HOSPITAL_COMMUNITY): Payer: Medicare Other

## 2022-08-01 ENCOUNTER — Other Ambulatory Visit: Payer: Self-pay | Admitting: Physician Assistant

## 2022-08-01 DIAGNOSIS — Z833 Family history of diabetes mellitus: Secondary | ICD-10-CM | POA: Diagnosis not present

## 2022-08-01 DIAGNOSIS — E785 Hyperlipidemia, unspecified: Secondary | ICD-10-CM | POA: Diagnosis not present

## 2022-08-01 DIAGNOSIS — Z952 Presence of prosthetic heart valve: Secondary | ICD-10-CM

## 2022-08-01 DIAGNOSIS — G4733 Obstructive sleep apnea (adult) (pediatric): Secondary | ICD-10-CM

## 2022-08-01 DIAGNOSIS — I350 Nonrheumatic aortic (valve) stenosis: Principal | ICD-10-CM | POA: Diagnosis present

## 2022-08-01 DIAGNOSIS — B0229 Other postherpetic nervous system involvement: Secondary | ICD-10-CM | POA: Diagnosis present

## 2022-08-01 DIAGNOSIS — Z7951 Long term (current) use of inhaled steroids: Secondary | ICD-10-CM

## 2022-08-01 DIAGNOSIS — M1990 Unspecified osteoarthritis, unspecified site: Secondary | ICD-10-CM | POA: Diagnosis not present

## 2022-08-01 DIAGNOSIS — N400 Enlarged prostate without lower urinary tract symptoms: Secondary | ICD-10-CM | POA: Diagnosis present

## 2022-08-01 DIAGNOSIS — E876 Hypokalemia: Secondary | ICD-10-CM | POA: Diagnosis present

## 2022-08-01 DIAGNOSIS — Z006 Encounter for examination for normal comparison and control in clinical research program: Secondary | ICD-10-CM

## 2022-08-01 DIAGNOSIS — Z7982 Long term (current) use of aspirin: Secondary | ICD-10-CM

## 2022-08-01 DIAGNOSIS — Z823 Family history of stroke: Secondary | ICD-10-CM

## 2022-08-01 DIAGNOSIS — Z83438 Family history of other disorder of lipoprotein metabolism and other lipidemia: Secondary | ICD-10-CM | POA: Diagnosis not present

## 2022-08-01 DIAGNOSIS — I10 Essential (primary) hypertension: Secondary | ICD-10-CM

## 2022-08-01 DIAGNOSIS — Z96642 Presence of left artificial hip joint: Secondary | ICD-10-CM | POA: Diagnosis not present

## 2022-08-01 DIAGNOSIS — K219 Gastro-esophageal reflux disease without esophagitis: Secondary | ICD-10-CM | POA: Diagnosis present

## 2022-08-01 DIAGNOSIS — J449 Chronic obstructive pulmonary disease, unspecified: Secondary | ICD-10-CM | POA: Diagnosis not present

## 2022-08-01 DIAGNOSIS — N401 Enlarged prostate with lower urinary tract symptoms: Secondary | ICD-10-CM | POA: Diagnosis present

## 2022-08-01 DIAGNOSIS — Z888 Allergy status to other drugs, medicaments and biological substances status: Secondary | ICD-10-CM

## 2022-08-01 DIAGNOSIS — Z79899 Other long term (current) drug therapy: Secondary | ICD-10-CM | POA: Diagnosis not present

## 2022-08-01 DIAGNOSIS — Z87891 Personal history of nicotine dependence: Secondary | ICD-10-CM | POA: Diagnosis not present

## 2022-08-01 DIAGNOSIS — R338 Other retention of urine: Secondary | ICD-10-CM | POA: Diagnosis not present

## 2022-08-01 DIAGNOSIS — Z85828 Personal history of other malignant neoplasm of skin: Secondary | ICD-10-CM

## 2022-08-01 DIAGNOSIS — I2510 Atherosclerotic heart disease of native coronary artery without angina pectoris: Secondary | ICD-10-CM | POA: Diagnosis present

## 2022-08-01 DIAGNOSIS — I352 Nonrheumatic aortic (valve) stenosis with insufficiency: Secondary | ICD-10-CM | POA: Diagnosis not present

## 2022-08-01 DIAGNOSIS — Z9989 Dependence on other enabling machines and devices: Secondary | ICD-10-CM

## 2022-08-01 DIAGNOSIS — I714 Abdominal aortic aneurysm, without rupture, unspecified: Secondary | ICD-10-CM | POA: Diagnosis present

## 2022-08-01 DIAGNOSIS — Z885 Allergy status to narcotic agent status: Secondary | ICD-10-CM

## 2022-08-01 DIAGNOSIS — M4800 Spinal stenosis, site unspecified: Secondary | ICD-10-CM | POA: Diagnosis not present

## 2022-08-01 HISTORY — PX: INTRAOPERATIVE TRANSTHORACIC ECHOCARDIOGRAM: SHX6523

## 2022-08-01 HISTORY — PX: TRANSCATHETER AORTIC VALVE REPLACEMENT, TRANSFEMORAL: SHX6400

## 2022-08-01 HISTORY — DX: Spinal stenosis, site unspecified: M48.00

## 2022-08-01 HISTORY — DX: Presence of prosthetic heart valve: Z95.2

## 2022-08-01 HISTORY — DX: Nonrheumatic aortic (valve) stenosis: I35.0

## 2022-08-01 LAB — POCT I-STAT, CHEM 8
BUN: 11 mg/dL (ref 8–23)
BUN: 10 mg/dL (ref 8–23)
BUN: 11 mg/dL (ref 8–23)
BUN: 10 mg/dL (ref 8–23)
Calcium, Ion: 1.21 mmol/L (ref 1.15–1.40)
Calcium, Ion: 1.26 mmol/L (ref 1.15–1.40)
Calcium, Ion: 1.29 mmol/L (ref 1.15–1.40)
Calcium, Ion: 1.27 mmol/L (ref 1.15–1.40)
Chloride: 102 mmol/L (ref 98–111)
Chloride: 102 mmol/L (ref 98–111)
Chloride: 103 mmol/L (ref 98–111)
Chloride: 104 mmol/L (ref 98–111)
Creatinine, Ser: 0.6 mg/dL — ABNORMAL LOW (ref 0.61–1.24)
Creatinine, Ser: 0.6 mg/dL — ABNORMAL LOW (ref 0.61–1.24)
Creatinine, Ser: 0.6 mg/dL — ABNORMAL LOW (ref 0.61–1.24)
Creatinine, Ser: 0.6 mg/dL — ABNORMAL LOW (ref 0.61–1.24)
Glucose, Bld: 101 mg/dL — ABNORMAL HIGH (ref 70–99)
Glucose, Bld: 119 mg/dL — ABNORMAL HIGH (ref 70–99)
Glucose, Bld: 121 mg/dL — ABNORMAL HIGH (ref 70–99)
Glucose, Bld: 109 mg/dL — ABNORMAL HIGH (ref 70–99)
HCT: 42 % (ref 39.0–52.0)
HCT: 37 % — ABNORMAL LOW (ref 39.0–52.0)
HCT: 39 % (ref 39.0–52.0)
HCT: 38 % — ABNORMAL LOW (ref 39.0–52.0)
Hemoglobin: 14.3 g/dL (ref 13.0–17.0)
Hemoglobin: 12.6 g/dL — ABNORMAL LOW (ref 13.0–17.0)
Hemoglobin: 13.3 g/dL (ref 13.0–17.0)
Hemoglobin: 12.9 g/dL — ABNORMAL LOW (ref 13.0–17.0)
Potassium: 3.4 mmol/L — ABNORMAL LOW (ref 3.5–5.1)
Potassium: 3.5 mmol/L (ref 3.5–5.1)
Potassium: 3.5 mmol/L (ref 3.5–5.1)
Potassium: 3.4 mmol/L — ABNORMAL LOW (ref 3.5–5.1)
Sodium: 142 mmol/L (ref 135–145)
Sodium: 143 mmol/L (ref 135–145)
Sodium: 143 mmol/L (ref 135–145)
Sodium: 143 mmol/L (ref 135–145)
TCO2: 25 mmol/L (ref 22–32)
TCO2: 28 mmol/L (ref 22–32)
TCO2: 30 mmol/L (ref 22–32)
TCO2: 28 mmol/L (ref 22–32)

## 2022-08-01 LAB — ECHOCARDIOGRAM LIMITED
AR max vel: 2.93 cm2
AV Area VTI: 2.87 cm2
AV Area mean vel: 2.86 cm2
AV Mean grad: 4 mmHg
AV Peak grad: 7.4 mmHg
Ao pk vel: 1.36 m/s

## 2022-08-01 SURGERY — IMPLANTATION, AORTIC VALVE, TRANSCATHETER, FEMORAL APPROACH
Anesthesia: Monitor Anesthesia Care | Site: Chest

## 2022-08-01 MED ORDER — CLEVIDIPINE BUTYRATE 0.5 MG/ML IV EMUL
INTRAVENOUS | Status: DC | PRN
  Administered 2022-08-01: 1 mg/h via INTRAVENOUS

## 2022-08-01 MED ORDER — LISINOPRIL 20 MG PO TABS
40.0000 mg | ORAL_TABLET | Freq: Every day | ORAL | Status: DC
Start: 2022-08-01 — End: 2022-08-03
  Administered 2022-08-01 – 2022-08-03 (×3): 40 mg via ORAL
  Filled 2022-08-01 (×3): qty 2

## 2022-08-01 MED ORDER — ONDANSETRON HCL 4 MG/2ML IJ SOLN
4.0000 mg | Freq: Four times a day (QID) | INTRAMUSCULAR | Status: DC | PRN
  Administered 2022-08-01 – 2022-08-02 (×2): 4 mg via INTRAVENOUS
  Filled 2022-08-01 (×2): qty 2

## 2022-08-01 MED ORDER — POTASSIUM CHLORIDE CRYS ER 20 MEQ PO TBCR
20.0000 meq | EXTENDED_RELEASE_TABLET | Freq: Every day | ORAL | Status: DC
  Administered 2022-08-01 – 2022-08-03 (×3): 20 meq via ORAL
  Filled 2022-08-01 (×3): qty 1

## 2022-08-01 MED ORDER — PRAVASTATIN SODIUM 40 MG PO TABS
80.0000 mg | ORAL_TABLET | Freq: Every evening | ORAL | Status: DC
  Administered 2022-08-01 – 2022-08-03 (×3): 80 mg via ORAL
  Filled 2022-08-01 (×3): qty 2

## 2022-08-01 MED ORDER — FLUTICASONE PROPIONATE 50 MCG/ACT NA SUSP
2.0000 | Freq: Every day | NASAL | Status: DC
  Administered 2022-08-02: 2 via NASAL
  Filled 2022-08-01: qty 16

## 2022-08-01 MED ORDER — HYDRALAZINE HCL 20 MG/ML IJ SOLN
INTRAMUSCULAR | Status: AC
  Filled 2022-08-01: qty 1

## 2022-08-01 MED ORDER — CHLORHEXIDINE GLUCONATE 4 % EX LIQD: 60.0000 mL | Freq: Once | CUTANEOUS | Status: DC

## 2022-08-01 MED ORDER — SODIUM CHLORIDE 0.9 % IV SOLN: INTRAVENOUS | Status: DC

## 2022-08-01 MED ORDER — ORAL CARE MOUTH RINSE: 15.0000 mL | Freq: Once | OROMUCOSAL | Status: AC

## 2022-08-01 MED ORDER — PROPOFOL 10 MG/ML IV BOLUS
INTRAVENOUS | Status: AC
  Filled 2022-08-01: qty 20

## 2022-08-01 MED ORDER — HEPARIN SODIUM (PORCINE) 1000 UNIT/ML IJ SOLN
INTRAMUSCULAR | Status: AC
  Filled 2022-08-01: qty 10

## 2022-08-01 MED ORDER — SPIRONOLACTONE 12.5 MG HALF TABLET
12.5000 mg | ORAL_TABLET | Freq: Every day | ORAL | Status: DC
  Administered 2022-08-01 – 2022-08-03 (×3): 12.5 mg via ORAL
  Filled 2022-08-01 (×3): qty 1

## 2022-08-01 MED ORDER — SODIUM CHLORIDE 0.9 % IV SOLN: 250.0000 mL | INTRAVENOUS | Status: DC | PRN

## 2022-08-01 MED ORDER — DICLOFENAC SODIUM 75 MG PO TBEC
75.0000 mg | DELAYED_RELEASE_TABLET | Freq: Two times a day (BID) | ORAL | Status: DC
  Administered 2022-08-01 – 2022-08-03 (×5): 75 mg via ORAL
  Filled 2022-08-01 (×6): qty 1

## 2022-08-01 MED ORDER — NITROGLYCERIN IN D5W 200-5 MCG/ML-% IV SOLN: 0.0000 ug/min | INTRAVENOUS | Status: DC

## 2022-08-01 MED ORDER — PROPOFOL 500 MG/50ML IV EMUL
INTRAVENOUS | Status: DC | PRN
  Administered 2022-08-01: 25 ug/kg/min via INTRAVENOUS

## 2022-08-01 MED ORDER — FAMOTIDINE 20 MG PO TABS
40.0000 mg | ORAL_TABLET | Freq: Every evening | ORAL | Status: DC
  Administered 2022-08-01 – 2022-08-03 (×3): 40 mg via ORAL
  Filled 2022-08-01 (×3): qty 2

## 2022-08-01 MED ORDER — IODIXANOL 320 MG/ML IV SOLN
INTRAVENOUS | Status: DC | PRN
  Administered 2022-08-01: 150 mL

## 2022-08-01 MED ORDER — CHLORHEXIDINE GLUCONATE 0.12 % MT SOLN
15.0000 mL | Freq: Once | OROMUCOSAL | Status: AC
  Administered 2022-08-01: 15 mL via OROMUCOSAL
  Filled 2022-08-01: qty 15

## 2022-08-01 MED ORDER — ACETAMINOPHEN 650 MG RE SUPP: 650.0000 mg | Freq: Four times a day (QID) | RECTAL | Status: DC | PRN

## 2022-08-01 MED ORDER — ASPIRIN 81 MG PO TBEC
81.0000 mg | DELAYED_RELEASE_TABLET | Freq: Every day | ORAL | Status: DC
  Administered 2022-08-01 – 2022-08-03 (×3): 81 mg via ORAL
  Filled 2022-08-01 (×3): qty 1

## 2022-08-01 MED ORDER — CHLORHEXIDINE GLUCONATE 0.12 % MT SOLN: 15.0000 mL | Freq: Once | OROMUCOSAL | Status: DC

## 2022-08-01 MED ORDER — SODIUM CHLORIDE 0.9% FLUSH: 3.0000 mL | INTRAVENOUS | Status: DC | PRN

## 2022-08-01 MED ORDER — ZOLPIDEM TARTRATE 5 MG PO TABS
5.0000 mg | ORAL_TABLET | Freq: Every evening | ORAL | Status: DC | PRN
Start: 2022-08-01 — End: 2022-08-03

## 2022-08-01 MED ORDER — MOMETASONE FURO-FORMOTEROL FUM 200-5 MCG/ACT IN AERO
2.0000 | INHALATION_SPRAY | Freq: Two times a day (BID) | RESPIRATORY_TRACT | Status: DC
  Administered 2022-08-02 – 2022-08-03 (×3): 2 via RESPIRATORY_TRACT
  Filled 2022-08-01: qty 8.8

## 2022-08-01 MED ORDER — AMLODIPINE BESYLATE 5 MG PO TABS
5.0000 mg | ORAL_TABLET | Freq: Every day | ORAL | Status: DC
Start: 2022-08-01 — End: 2022-08-03
  Administered 2022-08-01 – 2022-08-03 (×3): 5 mg via ORAL
  Filled 2022-08-01 (×3): qty 1

## 2022-08-01 MED ORDER — PROTAMINE SULFATE 10 MG/ML IV SOLN
INTRAVENOUS | Status: DC | PRN
  Administered 2022-08-01: 120 mg via INTRAVENOUS

## 2022-08-01 MED ORDER — CHLORHEXIDINE GLUCONATE 4 % EX LIQD: 30.0000 mL | CUTANEOUS | Status: DC

## 2022-08-01 MED ORDER — HEPARIN SODIUM (PORCINE) 1000 UNIT/ML IJ SOLN
INTRAMUSCULAR | Status: DC | PRN
  Administered 2022-08-01: 12000 [IU] via INTRAVENOUS

## 2022-08-01 MED ORDER — 0.9 % SODIUM CHLORIDE (POUR BTL) OPTIME
TOPICAL | Status: DC | PRN
  Administered 2022-08-01: 1000 mL

## 2022-08-01 MED ORDER — ACETAMINOPHEN 10 MG/ML IV SOLN: 1000.0000 mg | Freq: Once | INTRAVENOUS | Status: DC | PRN

## 2022-08-01 MED ORDER — PANTOPRAZOLE SODIUM 40 MG PO TBEC
40.0000 mg | DELAYED_RELEASE_TABLET | Freq: Every day | ORAL | Status: DC
  Administered 2022-08-01 – 2022-08-03 (×3): 40 mg via ORAL
  Filled 2022-08-01 (×3): qty 1

## 2022-08-01 MED ORDER — TAMSULOSIN HCL 0.4 MG PO CAPS
0.4000 mg | ORAL_CAPSULE | ORAL | Status: AC
  Administered 2022-08-01: 0.4 mg via ORAL
  Filled 2022-08-01: qty 1

## 2022-08-01 MED ORDER — SODIUM CHLORIDE 0.9% FLUSH
3.0000 mL | Freq: Two times a day (BID) | INTRAVENOUS | Status: DC
  Administered 2022-08-01 – 2022-08-03 (×3): 3 mL via INTRAVENOUS

## 2022-08-01 MED ORDER — FENTANYL CITRATE (PF) 100 MCG/2ML IJ SOLN: 25.0000 ug | INTRAMUSCULAR | Status: DC | PRN

## 2022-08-01 MED ORDER — MELATONIN 5 MG PO TABS
20.0000 mg | ORAL_TABLET | Freq: Every day | ORAL | Status: DC
  Administered 2022-08-01 – 2022-08-02 (×2): 20 mg via ORAL
  Filled 2022-08-01 (×3): qty 4

## 2022-08-01 MED ORDER — OXYCODONE HCL 5 MG PO TABS
5.0000 mg | ORAL_TABLET | ORAL | Status: DC | PRN
  Administered 2022-08-01: 5 mg via ORAL
  Administered 2022-08-01: 10 mg via ORAL
  Filled 2022-08-01 (×2): qty 2

## 2022-08-01 MED ORDER — LIDOCAINE HCL 1 % IJ SOLN
INTRAMUSCULAR | Status: AC
  Filled 2022-08-01: qty 20

## 2022-08-01 MED ORDER — HEPARIN 6000 UNIT IRRIGATION SOLUTION
Status: AC
Start: 2022-08-01 — End: ?
  Filled 2022-08-01: qty 1500

## 2022-08-01 MED ORDER — TIZANIDINE HCL 4 MG PO TABS
4.0000 mg | ORAL_TABLET | Freq: Three times a day (TID) | ORAL | Status: DC
  Administered 2022-08-01 – 2022-08-03 (×7): 4 mg via ORAL
  Filled 2022-08-01 (×7): qty 1

## 2022-08-01 MED ORDER — CEFAZOLIN SODIUM-DEXTROSE 2-4 GM/100ML-% IV SOLN
2.0000 g | Freq: Three times a day (TID) | INTRAVENOUS | Status: AC
  Administered 2022-08-01 – 2022-08-02 (×2): 2 g via INTRAVENOUS
  Filled 2022-08-01 (×2): qty 100

## 2022-08-01 MED ORDER — ACETAMINOPHEN 325 MG PO TABS: 650.0000 mg | ORAL_TABLET | Freq: Four times a day (QID) | ORAL | Status: DC | PRN

## 2022-08-01 MED ORDER — DOXAZOSIN MESYLATE 8 MG PO TABS
8.0000 mg | ORAL_TABLET | Freq: Every day | ORAL | Status: DC
  Administered 2022-08-01: 8 mg via ORAL
  Filled 2022-08-01: qty 1

## 2022-08-01 MED ORDER — SODIUM CHLORIDE 0.9 % IV SOLN: INTRAVENOUS | Status: AC

## 2022-08-01 MED ORDER — LIDOCAINE HCL 1 % IJ SOLN
INTRAMUSCULAR | Status: DC | PRN
  Administered 2022-08-01: 20 mL via INTRADERMAL

## 2022-08-01 MED ORDER — HYDRALAZINE HCL 20 MG/ML IJ SOLN
10.0000 mg | INTRAMUSCULAR | Status: DC | PRN
  Administered 2022-08-01 (×2): 10 mg via INTRAVENOUS
  Filled 2022-08-01: qty 1

## 2022-08-01 MED ORDER — AMLODIPINE BESYLATE 5 MG PO TABS
ORAL_TABLET | ORAL | Status: AC
  Filled 2022-08-01: qty 1

## 2022-08-01 MED ORDER — HEPARIN 6000 UNIT IRRIGATION SOLUTION
Status: DC | PRN
  Administered 2022-08-01 (×3): 1

## 2022-08-01 MED ORDER — LACTATED RINGERS IV SOLN: INTRAVENOUS | Status: DC

## 2022-08-01 SURGICAL SUPPLY — 68 items
BAG COUNTER SPONGE SURGICOUNT (BAG) ×2 IMPLANT
BAG DECANTER FOR FLEXI CONT (MISCELLANEOUS) IMPLANT
BAG SNAP BAND KOVER 36X36 (MISCELLANEOUS) IMPLANT
BLADE CLIPPER SURG (BLADE) IMPLANT
BLADE STERNUM SYSTEM 6 (BLADE) IMPLANT
CABLE ADAPT CONN TEMP 6FT (ADAPTER) ×2 IMPLANT
CANISTER SUCT 3000ML PPV (MISCELLANEOUS) IMPLANT
CATH DIAG EXPO 6F AL1 (CATHETERS) IMPLANT
CATH DIAG EXPO 6F VENT PIG 145 (CATHETERS) ×4 IMPLANT
CATH S G BIP PACING (CATHETERS) ×2 IMPLANT
CHLORAPREP W/TINT 26 (MISCELLANEOUS) ×2 IMPLANT
CNTNR URN SCR LID CUP LEK RST (MISCELLANEOUS) ×4 IMPLANT
CONT SPEC 4OZ STRL OR WHT (MISCELLANEOUS) ×4
COVER BACK TABLE 80X110 HD (DRAPES) IMPLANT
DERMABOND ADVANCED (GAUZE/BANDAGES/DRESSINGS) ×4
DERMABOND ADVANCED .7 DNX12 (GAUZE/BANDAGES/DRESSINGS) ×2 IMPLANT
DEVICE CLOSURE PERCLS PRGLD 6F (VASCULAR PRODUCTS) ×4 IMPLANT
DRSG TEGADERM 4X10 (GAUZE/BANDAGES/DRESSINGS) IMPLANT
DRSG TEGADERM 4X4.75 (GAUZE/BANDAGES/DRESSINGS) ×4 IMPLANT
ELECT REM PT RETURN 9FT ADLT (ELECTROSURGICAL) ×2
ELECTRODE REM PT RTRN 9FT ADLT (ELECTROSURGICAL) ×2 IMPLANT
GAUZE SPONGE 4X4 12PLY STRL (GAUZE/BANDAGES/DRESSINGS) ×2 IMPLANT
GLOVE BIO SURGEON STRL SZ7.5 (GLOVE) IMPLANT
GLOVE BIO SURGEON STRL SZ8 (GLOVE) IMPLANT
GLOVE BIOGEL PI MICRO STRL 5.5 (GLOVE) IMPLANT
GLOVE ECLIPSE 7.0 STRL STRAW (GLOVE) ×2 IMPLANT
GLOVE ECLIPSE 7.5 STRL STRAW (GLOVE) IMPLANT
GLOVE ORTHO TXT STRL SZ7.5 (GLOVE) IMPLANT
GOWN STRL REUS W/ TWL LRG LVL3 (GOWN DISPOSABLE) IMPLANT
GOWN STRL REUS W/ TWL XL LVL3 (GOWN DISPOSABLE) ×2 IMPLANT
GOWN STRL REUS W/TWL LRG LVL3 (GOWN DISPOSABLE) ×2
GOWN STRL REUS W/TWL XL LVL3 (GOWN DISPOSABLE) ×6
GUIDEWIRE MEIER 185 .035 J-TIP (WIRE) IMPLANT
GUIDEWIRE SAF TJ AMPL .035X180 (WIRE) ×2 IMPLANT
GUIDEWIRE SAFE TJ AMPLATZ EXST (WIRE) ×2 IMPLANT
KIT BASIN OR (CUSTOM PROCEDURE TRAY) ×2 IMPLANT
KIT HEART LEFT (KITS) ×2 IMPLANT
KIT SAPIAN 3 ULTRA RESILIA 29 (Valve) IMPLANT
KIT TURNOVER KIT B (KITS) ×2 IMPLANT
NS IRRIG 1000ML POUR BTL (IV SOLUTION) ×2 IMPLANT
PACK ENDO MINOR (CUSTOM PROCEDURE TRAY) ×2 IMPLANT
PAD ARMBOARD 7.5X6 YLW CONV (MISCELLANEOUS) ×4 IMPLANT
PAD ELECT DEFIB RADIOL ZOLL (MISCELLANEOUS) ×2 IMPLANT
PERCLOSE PROGLIDE 6F (VASCULAR PRODUCTS) ×4
POSITIONER HEAD DONUT 9IN (MISCELLANEOUS) ×2 IMPLANT
SET MICROPUNCTURE 5F STIFF (MISCELLANEOUS) ×2 IMPLANT
SHEATH BRITE TIP 7FR 35CM (SHEATH) ×2 IMPLANT
SHEATH PINNACLE 6F 10CM (SHEATH) ×2 IMPLANT
SHEATH PINNACLE 8F 10CM (SHEATH) ×2 IMPLANT
SLEEVE REPOSITIONING LENGTH 30 (MISCELLANEOUS) ×2 IMPLANT
SPIKE FLUID TRANSFER (MISCELLANEOUS) ×2 IMPLANT
SPONGE T-LAP 18X18 ~~LOC~~+RFID (SPONGE) ×2 IMPLANT
STOPCOCK MORSE 400PSI 3WAY (MISCELLANEOUS) ×4 IMPLANT
SUT PROLENE 6 0 C 1 30 (SUTURE) IMPLANT
SUT SILK  1 MH (SUTURE) ×2
SUT SILK 1 MH (SUTURE) ×2 IMPLANT
SYR 20ML LL LF (SYRINGE) IMPLANT
SYR 3ML LL SCALE MARK (SYRINGE) IMPLANT
SYR 50ML LL SCALE MARK (SYRINGE) ×2 IMPLANT
SYR BULB IRRIG 60ML STRL (SYRINGE) IMPLANT
TOWEL GREEN STERILE (TOWEL DISPOSABLE) ×4 IMPLANT
TRANSDUCER W/STOPCOCK (MISCELLANEOUS) ×4 IMPLANT
TRAY FOLEY SLVR 14FR TEMP STAT (SET/KITS/TRAYS/PACK) IMPLANT
TUBE SUCT INTRACARD DLP 20F (MISCELLANEOUS) IMPLANT
WIRE EMERALD 3MM-J .035X150CM (WIRE) ×2 IMPLANT
WIRE EMERALD 3MM-J .035X260CM (WIRE) ×2 IMPLANT
WIRE EMERALD ST .035X260CM (WIRE) ×4 IMPLANT
WIRE MICRO SET SILHO 5FR 7 (SHEATH) IMPLANT

## 2022-08-01 NOTE — Anesthesia Procedure Notes (Signed)
Arterial Line Insertion Performed by: Valda Favia, CRNA, CRNA  Patient location: Pre-op. Preanesthetic checklist: patient identified, IV checked, site marked, risks and benefits discussed, surgical consent, monitors and equipment checked, pre-op evaluation, timeout performed and anesthesia consent Right, radial was placed Catheter size: 20 G Hand hygiene performed , maximum sterile barriers used  and Seldinger technique used Allen's test indicative of satisfactory collateral circulation Attempts: 2 Procedure performed without using ultrasound guided technique. Following insertion, dressing applied and Biopatch. Post procedure assessment: normal  Patient tolerated the procedure well with no immediate complications.

## 2022-08-01 NOTE — Op Note (Signed)
[facility]  TAVR OPERATIVE NOTE   Date of Procedure:  08/01/2022  Preoperative Diagnosis: Severe Aortic Stenosis   Postoperative Diagnosis: Same   Procedure:    Transcatheter Aortic Valve Replacement - Transfemoral Approach  Edwards Sapien 3 Resilia THV (size 29 mm, model # 9755RLS, serial # [id number])   Co-Surgeons:  Gilford Raid, MD, Coralie Common, MD and Lenna Sciara, MD Anesthesiologist:  Rochele Pages, DO  Echocardiographer:  Sanda Klein, MD  Pre-operative Echo Findings: Severe aortic stenosis Normal left ventricular systolic function  Post-operative Echo Findings: No paravalvular leak Normal left ventricular systolic function  Left Heart Catheterization Findings: Left ventricular end-diastolic pressure of 22 mmHg   BRIEF CLINICAL NOTE AND INDICATIONS FOR SURGERY  The patient is a 76 year old male with a history of severe symptomatic aortic stenosis, COPD, spinal stenosis, osteoarthritis, and frailty who was referred for recommendations regarding his symptomatic aortic valvular disease.  During the course of the patient's preoperative work up they have been evaluated comprehensively by a multidisciplinary team of specialists coordinated through the [facility] in the [facility].  They have been demonstrated to suffer from symptomatic severe aortic stenosis as noted above. The patient has been counseled extensively as to the relative risks and benefits of all options for the treatment of severe aortic stenosis including long term medical therapy, conventional surgery for aortic valve replacement, and transcatheter aortic valve replacement.  The patient has been independently evaluated by Dr. Cyndia Bent with CT surgery and they are felt to be at high risk for conventional surgical aortic valve replacement. The surgeon indicated the patient would be a poor candidate for conventional surgery. Based upon review of  all of the patient's preoperative diagnostic tests they are felt to be candidate for transcatheter aortic valve replacement using the transfemoral approach as an alternative to high risk conventional surgery.    Following the decision to proceed with transcatheter aortic valve replacement, a discussion has been held regarding what types of management strategies would be attempted intraoperatively in the event of life-threatening complications, including whether or not the patient would be considered a candidate for the use of cardiopulmonary bypass and/or conversion to open sternotomy for attempted surgical intervention.  The patient has been advised of a variety of complications that might develop peculiar to this approach including but not limited to risks of death, stroke, paravalvular leak, aortic dissection or other major vascular complications, aortic annulus rupture, device embolization, cardiac rupture or perforation, acute myocardial infarction, arrhythmia, heart block or bradycardia requiring permanent pacemaker placement, congestive heart failure, respiratory failure, renal failure, pneumonia, infection, other late complications related to structural valve deterioration or migration, or other complications that might ultimately cause a temporary or permanent loss of functional independence or other long term morbidity.  The patient provides full informed consent for the procedure as described and all questions were answered preoperatively.    DETAILS OF THE OPERATIVE PROCEDURE  PREPARATION:   The patient is brought to the operating room on the above mentioned date and central monitoring was established by the anesthesia team including placement of a radial arterial line. The patient is placed in the supine position on the operating table.  Intravenous antibiotics are administered. Conscious sedation is used.   Baseline transthoracic echocardiogram was performed. The patient's chest, abdomen,  both groins, and both lower extremities are prepared and draped in a sterile manner. A time out procedure is performed.   PERIPHERAL ACCESS:   Using the modified Seldinger  technique, femoral arterial and venous access were obtained with placement of a 6 Fr sheath in the artery and a 7 Fr sheath in the vein on the left and right sides respectively using u/s guidance.  A pigtail diagnostic catheter was passed through the femoral arterial sheath under fluoroscopic guidance into the aortic root.  Aortic root angiography was performed in order to determine the optimal angiographic angle for valve deployment.  TRANSFEMORAL ACCESS:  A micropuncture kit was used to gain access to the right femoral artery using u/s guidance. Position confirmed with angiography. Pre-closure with double ProGlide closure devices. The patient was heparinized systemically and ACT verified > 250 seconds.    A 16 Fr transfemoral E-sheath was introduced into the right femoral artery after progressively dilating over an Amplatz superstiff wire. A pigtail catheter was used to direct a straight-tip exchange length wire across the native aortic valve into the left ventricle. This was exchanged out for a pigtail catheter and position was confirmed in the LV apex. Simultaneous left ventricular, aortic, and left ventricular end-diastolic pressures were recorded.  The pigtail catheter was then exchanged for an Safari wire in the LV apex.  Direct LV pacing thresholds were assessed and found to be adequate.   TRANSCATHETER HEART VALVE DEPLOYMENT:  An Edwards Sapien 3 THV (size 29 mm) was prepared and crimped per manufacturer's guidelines, and the proper orientation of the valve is confirmed on the [employer] delivery system. The valve was advanced through the introducer sheath using normal technique until in an appropriate position in the abdominal aorta beyond the sheath tip. The balloon was then retracted and using the fine-tuning wheel  was centered on the valve. The valve was then advanced across the aortic arch using appropriate flexion of the catheter. The valve was carefully positioned across the aortic valve annulus. The Commander catheter was retracted using normal technique. Once final position of the valve has been confirmed by angiographic assessment, the valve is deployed while temporarily holding ventilation and during rapid ventricular pacing to maintain systolic blood pressure < 50 mmHg and pulse pressure < 10 mmHg. The balloon inflation is held for >3 seconds after reaching full deployment volume. Once the balloon has fully deflated the balloon is retracted into the ascending aorta and valve function is assessed using TTE. There is felt to be no paravalvular leak and no central aortic insufficiency.  The patient's hemodynamic recovery following valve deployment is good.  The deployment balloon and guidewire are both removed. Echo demostrated acceptable post-procedural gradients, stable mitral valve function, and no AI.   PROCEDURE COMPLETION:  The sheath was then removed and closure devices were completed. Protamine was administered once femoral arterial repair was complete. The temporary pacemaker, pigtail catheters and femoral sheaths were removed and manual pressure applied for arterial and venous hemostasis.    The patient tolerated the procedure well and is transported to the surgical intensive care in stable condition. There were no immediate intraoperative complications. All sponge instrument and needle counts are verified correct at completion of the operation.   No blood products were administered during the operation.  The patient received a total of 150 mL of intravenous contrast during the procedure.  Early Osmond MD 08/01/2022 12:39 PM

## 2022-08-01 NOTE — Anesthesia Postprocedure Evaluation (Signed)
Anesthesia Post Note  Patient: Mark Zamora  Procedure(s) Performed: Transcatheter Aortic Valve Replacement, Transfemoral using Edwards 29 MM SAPIEN 3 Ultra (Left: Chest) INTRAOPERATIVE TRANSTHORACIC ECHOCARDIOGRAM     Patient location during evaluation: PACU Anesthesia Type: MAC Level of consciousness: awake and alert Pain management: pain level controlled Vital Signs Assessment: post-procedure vital signs reviewed and stable Respiratory status: spontaneous breathing, nonlabored ventilation, respiratory function stable and patient connected to nasal cannula oxygen Cardiovascular status: stable and blood pressure returned to baseline Postop Assessment: no apparent nausea or vomiting Anesthetic complications: no   No notable events documented.  Last Vitals:  Vitals:   08/01/22 1440 08/01/22 1445  BP: 109/72 134/68  Pulse: 61 61  Resp: 11 11  Temp:    SpO2: 100% 100%    Last Pain:  Vitals:   08/01/22 1245  TempSrc: Temporal  PainSc: 0-No pain                 [name]

## 2022-08-01 NOTE — Interval H&P Note (Signed)
History and Physical Interval Note:  08/01/2022 9:34 AM  Mark Zamora  has presented today for surgery, with the diagnosis of Severe Aortic Stenosis.  The various methods of treatment have been discussed with the patient and family. After consideration of risks, benefits and other options for treatment, the patient has consented to  Procedure(s): Transcatheter Aortic Valve Replacement, Transfemoral (Left) INTRAOPERATIVE TRANSTHORACIC ECHOCARDIOGRAM (N/A) as a surgical intervention.  The patient's history has been reviewed, patient examined, no change in status, stable for surgery.  I have reviewed the patient's chart and labs.  Questions were answered to the patient's satisfaction.     Gaye Pollack

## 2022-08-01 NOTE — Op Note (Addendum)
[facility]   MULTIDISCIPLINARY HEART VALVE TEAM   TAVR OPERATIVE NOTE   Date of Procedure:  08/01/22  Preoperative Diagnosis: Severe Aortic Stenosis   Postoperative Diagnosis: Same   Procedure:   Transcatheter Aortic Valve Replacement - Percutaneous Right Transfemoral Approach  Edwards Sapien 3 Ultra THV (size 29 mm, model # 9755RSL serial # [id number])   Co-Surgeons: Coralie Common MD  and Lenna Sciara, MD   Anesthesiologist:  Rochele Pages MD  Echocardiographer:  Sanda Klein MD  Pre-operative Echo Findings: Severe aortic stenosis Normal  left ventricular systolic function  Post-operative Echo Findings: Np paravalvular leak Normal left ventricular systolic function   BRIEF CLINICAL NOTE AND INDICATIONS FOR SURGERY  This 76 year old gentleman has stage D1, severe, symptomatic aortic stenosis with NYHA class II symptoms of exertional fatigue and shortness of breath consistent with chronic diastolic congestive heart failure.  I have personally reviewed his 2D echocardiogram, cardiac catheterization, and CTA studies.  His echocardiogram shows a moderately calcified and thickened aortic valve with a mean gradient of 38 mmHg and a valve area of 0.62 cm consistent with severe aortic stenosis.  Cardiac catheterization shows mild nonobstructive coronary disease.  I agree that aortic valve replacement is indicated in this patient for relief of his symptoms and to prevent left ventricular dysfunction.  Given his age I think transcatheter aortic valve replacement would be the best option for treating him.  His gated cardiac CTA shows anatomy suitable for TAVR using a SAPIEN 3 valve.  Abdominal and pelvic CTA shows adequate pelvic vascular anatomy to allow transfemoral insertion.   The patient was counseled at length regarding treatment alternatives for management of severe symptomatic aortic stenosis. The risks and benefits of surgical intervention has been discussed  in detail. Long-term prognosis with medical therapy was discussed. Alternative approaches such as conventional surgical aortic valve replacement, transcatheter aortic valve replacement, and palliative medical therapy were compared and contrasted at length. This discussion was placed in the context of the patient's own specific clinical presentation and past medical history. All of his questions have been addressed.    Following the decision to proceed with transcatheter aortic valve replacement, a discussion was held regarding what types of management strategies would be attempted intraoperatively in the event of life-threatening complications, including whether or not the patient would be considered a candidate for the use of cardiopulmonary bypass and/or conversion to open sternotomy for attempted surgical intervention.  I think he would be a candidate for emergent sternotomy to manage any intraoperative complications.  The patient is aware of the fact that transient use of cardiopulmonary bypass may be necessary. The patient has been advised of a variety of complications that might develop including but not limited to risks of death, stroke, paravalvular leak, aortic dissection or other major vascular complications, aortic annulus rupture, device embolization, cardiac rupture or perforation, mitral regurgitation, acute myocardial infarction, arrhythmia, heart block or bradycardia requiring permanent pacemaker placement, congestive heart failure, respiratory failure, renal failure, pneumonia, infection, other late complications related to structural valve deterioration or migration, or other complications that might ultimately cause a temporary or permanent loss of functional independence or other long term morbidity. The patient provides full informed consent for the procedure as described and all questions were answered.     DETAILS OF THE OPERATIVE PROCEDURE  PREPARATION:    The patient was brought to  the operating room on the above mentioned date and appropriate monitoring was established by the anesthesia team. The patient was placed in  the supine position on the operating table.  Intravenous antibiotics were administered. The patient was monitored closely throughout the procedure under conscious sedation.    Baseline transthoracic echocardiogram was performed. The patient's abdomen and both groins were prepped and draped in a sterile manner. A time out procedure was performed.   PERIPHERAL ACCESS:    Using the modified Seldinger technique, femoral arterial and venous access was obtained with placement of 6 Fr sheaths on the Right and left side. Left and right femoral puncture sites were confirmed with angiography.  A pigtail diagnostic catheter was passed through the left arterial sheath under fluoroscopic guidance into the aortic root.   Aortic root angiography was performed in order to determine the optimal angiographic angle for valve deployment.   TRANSFEMORAL ACCESS:   Percutaneous transfemoral access and sheath placement was performed using ultrasound guidance.  The right common femoral artery was cannulated using a micropuncture needle and appropriate location was verified using hand injection angiogram.  A pair of Abbott Perclose percutaneous closure devices were placed and a 6 French sheath replaced into the femoral artery.  The patient was heparinized systemically and ACT verified > 250 seconds.    A 16 Fr transfemoral E-sheath was introduced into the right common femoral artery after progressively dilating over an Amplatz superstiff wire. An pigtail catheter was used to direct a straight-tip exchange length wire across the native aortic valve into the left ventricle. Position was confirmed in the LV apex. Simultaneous LV and Ao pressures were recorded.  The pigtail catheter was exchanged for a Safari wire in the LV apex. A large curved needle was placed in the groin area and direct LV  pacing was tested to ensure stable lead placement and pacemaker capture.  BALLOON AORTIC VALVULOPLASTY:   Was not performed   TRANSCATHETER HEART VALVE DEPLOYMENT:   An Edwards Sapien 3 transcatheter heart valve (size 29 mm) was prepared and crimped per manufacturer's guidelines, and the proper orientation of the valve is confirmed on the [employer] delivery system. The valve was advanced through the introducer sheath using normal technique until in an appropriate position in the abdominal aorta beyond the sheath tip. The balloon was then retracted and using the fine-tuning wheel was centered on the valve. The valve was then advanced across the aortic arch using appropriate flexion of the catheter. The valve was carefully positioned across the aortic valve annulus. The Commander catheter was retracted using normal technique. Once final position of the valve has been confirmed by angiographic assessment, the valve is deployed during rapid ventricular pacing to maintain systolic blood pressure < 50 mmHg and pulse pressure < 10 mmHg. The balloon inflation is held for >5 seconds after reaching full deployment volume. Once the balloon has fully deflated the balloon is retracted into the ascending aorta and valve function is assessed using echocardiography. There is felt to be no paravalvular leak and no central aortic insufficiency.  The patient's hemodynamic recovery following valve deployment is good.  The deployment balloon and guidewire are both removed.    PROCEDURE COMPLETION:   The sheath was removed and femoral artery closure performed.  Protamine was administered once femoral arterial repair was complete. The pigtail catheter and femoral sheaths were removed with manual pressure used for venous hemostasis. Angiography via the left femoral pigtail was then performed verifying the patency of the Right femoral closure site. Direct pressure was applied to both the left femoral arterial site and  right femoral venous site.  The patient tolerated the  procedure well and is transported to the cath lab recovery area in stable condition. There were no immediate intraoperative complications. All sponge instrument and needle counts are verified correct at completion of the operation.   No blood products were administered during the operation.   LVEDP prior to TAVR was 63mHg  '@ME'$ @, MD '@TD'$ @ '@NOW'$ @

## 2022-08-01 NOTE — Discharge Summary (Addendum)
[place] VALVE TEAM  Discharge Summary    Patient ID: Mark Zamora MRN: [id number]; DOB: Aug 07, 1946  Admit date: 08/01/2022 Discharge date: 08/03/2022  Primary Care Provider: Maury Dus, MD  Primary Cardiologist: Early Osmond, MD   Discharge Diagnoses    Principal Problem:   S/P TAVR (transcatheter aortic valve replacement) Active Problems:   BPH (benign prostatic hyperplasia)   OSA on CPAP   GERD (gastroesophageal reflux disease)   Essential hypertension   COPD (chronic obstructive pulmonary disease) ([place])   Severe aortic stenosis   Spinal stenosis   Allergies Allergies  Allergen Reactions   Compazine [Prochlorperazine Edisylate] Anaphylaxis and Other (See Comments)    Pt states "any nausea med ending in "zine"  Seizures   Cymbalta [Duloxetine Hcl] Other (See Comments)    Urinary retention    Promethazine Anaphylaxis and Other (See Comments)    seizures   Codeine Other (See Comments)    constipation   Other Other (See Comments)    All antiemetic meds ending -zine  Prefers to not take any narcotic pain meds    Diagnostic Studies/Procedures    TAVR OPERATIVE NOTE     Date of Procedure:                08/01/22   Preoperative Diagnosis:      Severe Aortic Stenosis    Postoperative Diagnosis:    Same    Procedure:        Transcatheter Aortic Valve Replacement - Percutaneous Right Transfemoral Approach             Edwards Sapien 3 Ultra THV (size 29 mm, model # 9755RSL serial # [id number])              Co-Surgeons:            Coralie Common MD  and Lenna Sciara, MD     Anesthesiologist:                  Rochele Pages MD   Echocardiographer:              Sanda Klein MD   Pre-operative Echo Findings: Severe aortic stenosis Normal  left ventricular systolic function   Post-operative Echo Findings: Np paravalvular leak Normal left ventricular systolic function   _____________    Echo 08/02/22:   IMPRESSIONS   1. Left ventricular ejection fraction, by estimation, is 55 to 60%. The  left ventricle has normal function. There is moderate concentric left  ventricular hypertrophy.   2. Right ventricular systolic function is normal. The right ventricular  size is normal. Tricuspid regurgitation signal is inadequate for assessing  PA pressure.   3. Left atrial size was mild to moderately dilated.   4. Right atrial size was mildly dilated.   5. The mitral valve is normal in structure. No evidence of mitral valve  regurgitation. No evidence of mitral stenosis.   6. There is a 29 mm Edwards Sapien 3 Ultra TAVR valve present in the  aortic position. Procedure Date: 08/01/22. Echo findings are consistent with  normal structure and function of the aortic valve prosthesis. Aortic valve  area, by VTI measures 3.16 cm.  Aortic valve Vmax measures 1.86 m/s. Aortic valve acceleration time  measures 46 msec.    History of Present Illness     Mark Zamora is a 76 y.o. male with a history of COPD, spinal stenosis, AAA , HTN, OSA on CPAP and severe  aortic stenosis who presented to [facility] on 08/01/22 for planned TAVR.   Over the past 6 months he has had increasing shortness of breath with activities around his house and walking his dog. He has been the primary caregiver for his wife who is suffering from dementia but recently had to put her in an assisted living center because he could not care for her any longer.  2D echocardiogram on 05/17/2022 showed EF 55-60%, severe AS with mean grad 38 mmHg, peak grad 59.9 mmHg, AVA 0.62cm2, DVI 0.20, SVI 35. L/RHC 05/31/2022 showed mild nonobstructive coronary disease.  The patient has been evaluated by the multidisciplinary valve team and felt to have severe, symptomatic aortic stenosis and to be a suitable candidate for TAVR, which was set up for 08/01/22.   Hospital Course     Consultants: none   Severe AS: s/p successful TAVR with a 29 mm Edwards Sapien 3  Ultra Resilia THV via the TF approach on 08/01/22. Post operative echo showed EF 55%, normally functioning TAVR with a mean gradient of 7 mmHg and no PVL. Groin sites are stable. ECG with sinus and no high grade heart block. Resumed on home Asprin 8m daily. Plan for discharge home with close follow up in the outpatient setting.   HTN: Bp has been elevated. Resume home meds and follow as an outpatient.   Acute urinary retention: treated with tamsulosin while admitted. Now resolved. Will resume home doxazosin per patient wishes.  If he has recurrent issues, I can call in tamsulosin  N/V: resolved. Treated with IV Zofran. White count improving. No s/s infection.    Hypokalemia: K 3.4 at discharge. Resume home potassium supplementation.  _____________  Discharge Vitals Blood pressure (!) 166/73, pulse 63, temperature 98 F (36.7 C), temperature source Oral, resp. rate 19, height '5\' 4"'  (1.626 m), weight 80.2 kg, SpO2 96 %.  Filed Weights   08/01/22 0839 08/03/22 0545  Weight: 79.4 kg 80.2 kg   GEN: Well nourished, well developed, in no acute distress.  HEENT: Grossly normal.  Neck: Supple, no JVD, carotid bruits, or masses. Cardiac: RRR, no murmurs, rubs, or gallops. No clubbing, cyanosis, edema.   Respiratory:  Respirations regular and unlabored, clear to auscultation bilaterally. GI: Soft, nontender, nondistended, BS + x 4. MS: no deformity or atrophy. Skin: warm and dry, no rash.  Groin sites clear without hematoma or ecchymosis  Neuro:  Strength and sensation are intact. Psych: AAOx3.  Normal affect.  Labs & Radiologic Studies    CBC Recent Labs    08/02/22 0748 08/03/22 0217  WBC 15.7* 12.4*  HGB 13.1 11.2*  HCT 38.1* 33.8*  MCV 87.8 89.4  PLT 148* 1841   Basic Metabolic Panel Recent Labs    08/02/22 0748 08/03/22 0217  NA 137 137  K 3.2* 3.4*  CL 104 106  CO2 24 25  GLUCOSE 123* 99  BUN 16 19  CREATININE 0.80 0.84  CALCIUM 8.9 8.9   Liver Function Tests No  results for input(s): "AST", "ALT", "ALKPHOS", "BILITOT", "PROT", "ALBUMIN" in the last 72 hours. No results for input(s): "LIPASE", "AMYLASE" in the last 72 hours. Cardiac Enzymes No results for input(s): "CKTOTAL", "CKMB", "CKMBINDEX", "TROPONINI" in the last 72 hours. BNP Invalid input(s): "POCBNP" D-Dimer No results for input(s): "DDIMER" in the last 72 hours. Hemoglobin A1C No results for input(s): "HGBA1C" in the last 72 hours. Fasting Lipid Panel No results for input(s): "CHOL", "HDL", "LDLCALC", "TRIG", "CHOLHDL", "LDLDIRECT" in the last 72 hours. Thyroid Function  Tests No results for input(s): "TSH", "T4TOTAL", "T3FREE", "THYROIDAB" in the last 72 hours.  Invalid input(s): "FREET3" _____________  ECHOCARDIOGRAM LIMITED  Result Date: 08/02/2022    ECHOCARDIOGRAM LIMITED REPORT   Patient Name:   Mark Zamora Date of Exam: 08/02/2022 Medical Rec #:  [id number]          Height:       64.0 in Accession #:    [id number]         Weight:       175.0 lb Date of Birth:  09-11-1946         BSA:          1.848 m Patient Age:    63 years           BP:           131/52 mmHg Patient Gender: M                  HR:           62 bpm. Exam Location:  Inpatient Procedure: Limited Echo, Color Doppler and Cardiac Doppler Indications:    Post TAVR Evaluation z95.2  History:        Patient has prior history of Echocardiogram examinations, most                 recent 08/01/2022. COPD; Risk Factors:Hypertension, Dyslipidemia                 and Sleep Apnea.                 Aortic Valve: 29 mm Edwards Sapien 3 Ultra TAVR valve is present                 in the aortic position. Procedure Date: 08/01/22.  Sonographer:    Raquel Sarna Senior RDCS Referring Phys: [id number] [place]  1. Left ventricular ejection fraction, by estimation, is 55 to 60%. The left ventricle has normal function. There is moderate concentric left ventricular hypertrophy.  2. Right ventricular systolic function is normal. The right  ventricular size is normal. Tricuspid regurgitation signal is inadequate for assessing PA pressure.  3. Left atrial size was mild to moderately dilated.  4. Right atrial size was mildly dilated.  5. The mitral valve is normal in structure. No evidence of mitral valve regurgitation. No evidence of mitral stenosis.  6. There is a 29 mm Edwards Sapien 3 Ultra TAVR valve present in the aortic position. Procedure Date: 08/01/22. Echo findings are consistent with normal structure and function of the aortic valve prosthesis. Aortic valve area, by VTI measures 3.16 cm. Aortic valve Vmax measures 1.86 m/s. Aortic valve acceleration time measures 46 msec. FINDINGS  Left Ventricle: Left ventricular ejection fraction, by estimation, is 55 to 60%. The left ventricle has normal function. Mild hypokinesis of the left ventricular, basal lateral wall and inferolateral wall. There is moderate concentric left ventricular hypertrophy. Right Ventricle: The right ventricular size is normal. No increase in right ventricular wall thickness. Right ventricular systolic function is normal. Tricuspid regurgitation signal is inadequate for assessing PA pressure. Left Atrium: Left atrial size was mild to moderately dilated. Right Atrium: Right atrial size was mildly dilated. Pericardium: There is no evidence of pericardial effusion. Mitral Valve: The mitral valve is normal in structure. Mild mitral annular calcification. No evidence of mitral valve stenosis. Tricuspid Valve: The tricuspid valve is normal in structure. Tricuspid valve regurgitation is not demonstrated. Aortic Valve: Aortic valve mean  gradient measures 7.0 mmHg. Aortic valve peak gradient measures 13.8 mmHg. Aortic valve area, by VTI measures 3.16 cm. There is a 29 mm Edwards Sapien 3 Ultra TAVR valve present in the aortic position. Procedure Date: 08/01/22. Echo findings are consistent with normal structure and function of the aortic valve prosthesis. Pulmonic Valve: The pulmonic  valve was not well visualized. Aorta: The aortic root is normal in size and structure. LEFT VENTRICLE PLAX 2D LVOT diam:     2.40 cm LV SV:         117 LV SV Index:   63 LVOT Area:     4.52 cm  AORTIC VALVE AV Area (Vmax):    2.92 cm AV Area (Vmean):   3.31 cm AV Area (VTI):     3.16 cm AV Vmax:           186.00 cm/s AV Vmean:          117.000 cm/s AV VTI:            0.369 m AV Peak Grad:      13.8 mmHg AV Mean Grad:      7.0 mmHg LVOT Vmax:         120.00 cm/s LVOT Vmean:        85.700 cm/s LVOT VTI:          0.258 m LVOT/AV VTI ratio: 0.70  SHUNTS Systemic VTI:  0.26 m Systemic Diam: 2.40 cm Dani Gobble Croitoru MD Electronically signed by Sanda Klein MD Signature Date/Time: 08/02/2022/1:01:55 PM    Final    ECHOCARDIOGRAM LIMITED  Result Date: 08/01/2022    ECHOCARDIOGRAM LIMITED REPORT   Patient Name:   Mark Zamora Date of Exam: 08/01/2022 Medical Rec #:  [id number]          Height:       63.0 in Accession #:    [id number]         Weight:       179.4 lb Date of Birth:  09-Mar-1946         BSA:          1.846 m Patient Age:    35 years           BP:           189/76 mmHg Patient Gender: M                  HR:           75 bpm. Exam Location:  Inpatient Procedure: Limited Echo, Color Doppler and Cardiac Doppler Indications:     Aortic Stenosis i35.0  History:         Patient has prior history of Echocardiogram examinations, most                  recent 05/17/2022. COPD; Risk Factors:Hypertension, Dyslipidemia                  and Sleep Apnea.  Sonographer:     Raquel Sarna Senior RDCS Referring Phys:  [id number] Early Osmond Diagnosing Phys: Sanda Klein MD  Sonographer Comments: 69m Edwards [id number] Implanted  PRE-PROCEDURE FINDINGS Normal left ventricular systolic function. Estimated LVEF 55% Trileaflet aortic valve with severe calcific stenosis. Aortic valve peak gradient 60 mm Hg, mean gradient 35 mm Hg, dimensionless index 0.18, calculated valve area 0.63 cm sq (0.34 cm sq/m sq BSA). No aortic insufficiency.  No pericardial effusion. POST-PROCEDURE FINDINGS Normal left ventricular systolic function. Estimated LVEF 65% Well  deployed stent valve ( 29 mm Edwards S3U TAVR). Aortic valve peak gradient 7 mm Hg, mean gradient 4 mm Hg, dimensionless index .75, calculated valve area 2.87 cm sq (1.55 cm sq/m sq BSA), acceleration time 60 ms. No aortic insufficiency or perivalvular leak. No pericardial effusion.  IMPRESSIONS  1. Left ventricular ejection fraction, by estimation, is 55 to 60%. The left ventricle has normal function. The left ventricle demonstrates regional wall motion abnormalities (see scoring diagram/findings for description). There is moderate concentric left ventricular hypertrophy. There is mild hypokinesis of the left ventricular, basal lateral wall and inferolateral wall.  2. Right ventricular systolic function is normal. The right ventricular size is normal.  3. Left atrial size was mild to moderately dilated.  4. The mitral valve is normal in structure. No evidence of mitral valve regurgitation. No evidence of mitral stenosis. FINDINGS  Left Ventricle: Left ventricular ejection fraction, by estimation, is 55 to 60%. The left ventricle has normal function. The left ventricle demonstrates regional wall motion abnormalities. Mild hypokinesis of the left ventricular, basal lateral wall and  inferolateral wall. The left ventricular internal cavity size was normal in size. There is moderate concentric left ventricular hypertrophy. Right Ventricle: The right ventricular size is normal. No increase in right ventricular wall thickness. Right ventricular systolic function is normal. Left Atrium: Left atrial size was mild to moderately dilated. Right Atrium: Right atrial size was not well visualized. Pericardium: There is no evidence of pericardial effusion. Mitral Valve: The mitral valve is normal in structure. Mild mitral annular calcification. No evidence of mitral valve stenosis. Tricuspid Valve: The tricuspid  valve is grossly normal. Aortic Valve: Aortic valve mean gradient measures 4.0 mmHg. Aortic valve peak gradient measures 7.4 mmHg. Aortic valve area, by VTI measures 2.87 cm. Pulmonic Valve: The pulmonic valve was not well visualized. Aorta: The aortic root is normal in size and structure. LEFT VENTRICLE PLAX 2D LVOT diam:     2.20 cm LV SV:         82 LV SV Index:   44 LVOT Area:     3.80 cm  AORTIC VALVE AV Area (Vmax):    2.93 cm AV Area (Vmean):   2.86 cm AV Area (VTI):     2.87 cm AV Vmax:           136.00 cm/s AV Vmean:          89.500 cm/s AV VTI:            0.285 m AV Peak Grad:      7.4 mmHg AV Mean Grad:      4.0 mmHg LVOT Vmax:         105.00 cm/s LVOT Vmean:        67.300 cm/s LVOT VTI:          0.215 m LVOT/AV VTI ratio: 0.75  SHUNTS Systemic VTI:  0.22 m Systemic Diam: 2.20 cm Sanda Klein MD Electronically signed by Sanda Klein MD Signature Date/Time: 08/01/2022/2:27:14 PM    Final    Structural Heart Procedure  Result Date: 08/01/2022 See surgical note for result.  DG Chest 2 View  Result Date: 07/28/2022 CLINICAL DATA:  Aortic stenosis, COPD, medical clearance for surgical intervention EXAM: CHEST - 2 VIEW COMPARISON:  03/17/2020 FINDINGS: The heart size and mediastinal contours are within normal limits. Both lungs are clear. The visualized skeletal structures are unremarkable. Surgical clips are seen within the right axilla. IMPRESSION: No active cardiopulmonary disease. Electronically Signed   By: Cassandria Anger  Christa See M.D.   On: 07/28/2022 23:28    Disposition   Pt is being discharged home today in good condition.  Follow-up Plans & Appointments     Follow-up Information     Eileen Stanford, PA-C. Go on 08/09/2022.   Specialties: Cardiology, Radiology Why: @ 3:30pm, please arrive at least 10 minutes early. Contact information: [place] [address] [phone]                Discharge Instructions     Amb Referral to Cardiac  Rehabilitation   Complete by: As directed    [place]   Diagnosis: Valve Replacement   Valve: Aortic   After initial evaluation and assessments completed: Virtual Based Care may be provided alone or in conjunction with Phase 2 Cardiac Rehab based on patient barriers.: Yes   Intensive Cardiac Rehabilitation (ICR) [place] location only OR Traditional Cardiac Rehabilitation (TCR) If criteria for ICR are not met will enroll in TCR Practice [facility] only): Yes       Discharge Medications   Allergies as of 08/03/2022       Reactions   Compazine [prochlorperazine Edisylate] Anaphylaxis, Other (See Comments)   Pt states "any nausea med ending in "zine"  Seizures   Cymbalta [duloxetine Hcl] Other (See Comments)   Urinary retention    Promethazine Anaphylaxis, Other (See Comments)   seizures   Codeine Other (See Comments)   constipation   Other Other (See Comments)   All antiemetic meds ending -zine  Prefers to not take any narcotic pain meds        Medication List     TAKE these medications    acetaminophen 650 MG CR tablet Commonly known as: TYLENOL Take 1,300 mg by mouth every 8 (eight) hours as needed for pain.   albuterol 108 (90 Base) MCG/ACT inhaler Commonly known as: VENTOLIN HFA Inhale 1-2 puffs into the lungs every 6 (six) hours as needed for shortness of breath or wheezing.   amLODipine 5 MG tablet Commonly known as: NORVASC Take 5 mg by mouth daily.   aspirin EC 81 MG tablet Take 1 tablet (81 mg total) by mouth daily. Swallow whole.   budesonide-formoterol 160-4.5 MCG/ACT inhaler Commonly known as: SYMBICORT Inhale 2 puffs into the lungs 2 (two) times daily.   diclofenac 75 MG EC tablet Commonly known as: VOLTAREN Take 75 mg by mouth 2 (two) times daily.   doxazosin 8 MG tablet Commonly known as: CARDURA Take 8 mg by mouth at bedtime.   famotidine 40 MG tablet Commonly known as: PEPCID Take 40 mg by mouth every evening.   fluticasone 50 MCG/ACT nasal spray Commonly  known as: FLONASE Place 2 sprays into both nostrils at bedtime.   lisinopril 40 MG tablet Commonly known as: ZESTRIL Take 40 mg by mouth daily.   Melatonin 10 MG Tabs Take 20 mg by mouth at bedtime.   Metamucil 28.3 % Powd Generic drug: Psyllium Take 1 Scoop by mouth daily.   Mucinex DM Maximum Strength 60-1200 MG Tb12 Take 1 tablet by mouth at bedtime.   multivitamin capsule Take 1 capsule by mouth daily.   NON FORMULARY Pt uses a cpap nightly   omeprazole 20 MG capsule Commonly known as: PRILOSEC Take 20 mg by mouth every morning.   potassium chloride SA 20 MEQ tablet Commonly known as: KLOR-CON M Take 20 mEq by mouth daily.   pravastatin 80 MG tablet Commonly known as: PRAVACHOL Take 80 mg by mouth every  evening.   spironolactone 25 MG tablet Commonly known as: ALDACTONE Take 6.25 mg by mouth daily.   tiZANidine 4 MG capsule Commonly known as: ZANAFLEX Take 1 capsule (4 mg total) by mouth 3 (three) times daily.   traMADol 50 MG tablet Commonly known as: ULTRAM Take 1-2 tablets (50-100 mg total) by mouth every 6 (six) hours as needed (mild pain).   Vitamin D3 50 MCG (2000 UT) capsule Take 2,000 Units by mouth daily.   vitamin E 180 MG (400 UNITS) capsule Take 400 Units by mouth daily.   zolpidem 12.5 MG CR tablet Commonly known as: AMBIEN CR Take 12.5 mg by mouth at bedtime.        Outstanding Labs/Studies   none  Duration of Discharge Encounter   Greater than 30 minutes including physician time.  Signed, Angelena Form, PA-C 08/03/2022, 9:02 AM [phone]   ATTENDING ATTESTATION:  After conducting a review of all available clinical information with the care team, interviewing the patient, and performing a physical exam, I agree with the findings and plan described in this note.   GEN: No acute distress.   HEENT:  MMM, no JVD, no scleral icterus Cardiac: RRR, no murmurs, rubs, or gallops.  Respiratory: Clear to auscultation  bilaterally. GI: Soft, nontender, non-distended  MS: No edema; No deformity. Neuro:  Nonfocal  Vasc:  +2 radial pulses; access sites clean dry and intact  The patient is doing well today.  His nausea and urinary retention has resolved.  Will discharge home with close follow-up in structural heart clinic.  Resume home blood pressure medications.  Discharged on aspirin.  Lenna Sciara, MD Pager [phone]

## 2022-08-01 NOTE — Progress Notes (Addendum)
SITE AREA: right groin/femoral  SITE PRIOR TO REMOVAL:  LEVEL 0  PRESSURE APPLIED FOR: approximately 10 minutes  MANUAL: yes  PATIENT STATUS DURING PULL: stable  POST PULL SITE:  LEVEL 0  POST PULL INSTRUCTIONS GIVEN: yes  POST PULL PULSES PRESENT: bilateral pedal pulses at +2  DRESSING APPLIED: gauze with hypafix then changed to gauze with tegaderm  BEDREST BEGINS ....will start after sheath to left groin removed   COMMENTS:  right veinous sheath removed by smokey twine, RCIS

## 2022-08-01 NOTE — Progress Notes (Addendum)
LOCATION: right RADIAL a-line removed  DRESSING APPLIED: gauze with tegaderm  SITE UPON ARRIVAL: LEVEL 0  SITE AFTER BAND REMOVAL: LEVEL 0  CIRCULATION SENSATION AND MOVEMENT: bilateral +2 radial pulse  COMMENTS:  manual pressure applied for approximately 7 minutes

## 2022-08-01 NOTE — Discharge Instructions (Signed)

## 2022-08-01 NOTE — Transfer of Care (Signed)
Immediate Anesthesia Transfer of Care Note  Patient: Mark Zamora  Procedure(s) Performed: Transcatheter Aortic Valve Replacement, Transfemoral using Edwards 29 MM SAPIEN 3 Ultra (Left: Chest) INTRAOPERATIVE TRANSTHORACIC ECHOCARDIOGRAM  Patient Location: Cath Lab  Anesthesia Type:MAC  Level of Consciousness: awake, alert  and oriented  Airway & Oxygen Therapy: Patient Spontanous Breathing and Patient connected to nasal cannula oxygen  Post-op Assessment: Report given to RN and Post -op Vital signs reviewed and stable  Post vital signs: Reviewed and stable  Last Vitals:  Vitals Value Taken Time  BP 133/71 08/01/22 1249  Temp    Pulse 55 08/01/22 1255  Resp 17 08/01/22 1255  SpO2 91 % 08/01/22 1255  Vitals shown include unvalidated device data.  Last Pain:  Vitals:   08/01/22 0916  TempSrc:   PainSc: 5          Complications: No notable events documented.

## 2022-08-01 NOTE — Progress Notes (Signed)
  [place] VALVE TEAM  Patient doing well s/p TAVR. He is hemodynamically stable. BP elevated, will resume home meds. K 3.4. Will resume home [place]. Groin sites stable. ECG with new 1st deg AV block but no high grade block. Plan to DC arterial line and transfer to 4E. Plan for early ambulation after bedrest completed and hopeful discharge over the next 24-48 hours.   Angelena Form PA-C  [facility]  Pager [phone]

## 2022-08-01 NOTE — Anesthesia Preprocedure Evaluation (Addendum)
Anesthesia Evaluation  Patient identified by MRN, date of birth, ID band Patient awake    Reviewed: Allergy & Precautions, NPO status , Patient's Chart, lab work & pertinent test results  Airway Mallampati: II  TM Distance: >3 FB Neck ROM: Full    Dental no notable dental hx.    Pulmonary sleep apnea and Continuous Positive Airway Pressure Ventilation , COPD, former smoker,    Pulmonary exam normal        Cardiovascular hypertension, Pt. on medications + Peripheral Vascular Disease  + Valvular Problems/Murmurs AS  Rhythm:Regular Rate:Normal + Systolic murmurs    Neuro/Psych Anxiety Depression negative neurological ROS     GI/Hepatic Neg liver ROS, GERD  Medicated,  Endo/Other  negative endocrine ROS  Renal/GU negative Renal ROS  negative genitourinary   Musculoskeletal  (+) Arthritis , Osteoarthritis,    Abdominal Normal abdominal exam  (+)   Peds  Hematology Lab Results      Component                Value               Date                      WBC                      6.8                 07/28/2022                HGB                      13.3                07/28/2022                HCT                      40.3                07/28/2022                MCV                      90.8                07/28/2022                PLT                      203                 07/28/2022           Lab Results      Component                Value               Date                      NA                       137                 07/28/2022  K                        3.5                 07/28/2022                CO2                      29                  07/28/2022                GLUCOSE                  101 (H)             07/28/2022                BUN                      14                  07/28/2022                CREATININE               0.79                07/28/2022                CALCIUM                   9.1                 07/28/2022                EGFR                     94                  05/22/2022                GFRNONAA                 >60                 07/28/2022             Anesthesia Other Findings   Reproductive/Obstetrics                            Anesthesia Physical Anesthesia Plan  ASA: 3  Anesthesia Plan: MAC   Post-op Pain Management:    Induction: Intravenous  PONV Risk Score and Plan: 1 and Ondansetron, Dexamethasone, Propofol infusion, Treatment may vary due to age or medical condition and Midazolam  Airway Management Planned: Simple Face Mask and Nasal Cannula  Additional Equipment: Arterial line  Intra-op Plan:   Post-operative Plan:   Informed Consent: I have reviewed the patients History and Physical, chart, labs and discussed the procedure including the risks, benefits and alternatives for the proposed anesthesia with the patient or authorized representative who has indicated his/her understanding and acceptance.     Dental advisory given  Plan Discussed with: CRNA  Anesthesia Plan Comments: (ECHO 06/23: IMPRESSIONS  1. Left ventricular ejection fraction, by estimation, is 55 to 60%. The  left ventricle has normal function. The left ventricle demonstrates  regional wall motion abnormalities (see scoring diagram/findings for  description). Left ventricular diastolic  parameters are indeterminate. There is mild hypokinesis of the left  ventricular, basal inferolateral wall and inferior wall. The average left  ventricular global longitudinal strain is -16.6 %. The global longitudinal  strain is normal.  2. Right ventricular systolic function is normal. The right ventricular  size is normal. There is normal pulmonary artery systolic pressure.  3. Left atrial size was mild to moderately dilated.  4. The mitral valve is normal in structure. Trivial mitral valve  regurgitation. No evidence of mitral stenosis.  5.  The aortic valve has an indeterminant number of cusps. There is  moderate calcification of the aortic valve. There is moderate thickening  of the aortic valve. Aortic valve regurgitation is not visualized. Severe  aortic valve stenosis. Aortic valve  area, by VTI measures 0.62 cm. Aortic valve mean gradient measures 38.0  mmHg. Aortic valve Vmax measures 3.87 m/s.  6. The inferior vena cava is normal in size with greater than 50%  respiratory variability, suggesting right atrial pressure of 3 mmHg.   Comparison(s): No prior Echocardiogram.   Conclusion(s)/Recommendation(s): Likely severe aortic stenosis--moderate  by gradient/velocity, but severe by DI and AVA. )        Anesthesia Quick Evaluation

## 2022-08-01 NOTE — Progress Notes (Signed)
SITE AREA:left groin/femoral  SITE PRIOR TO REMOVAL:  LEVEL 0   PRESSURE APPLIED FOR: approximately 20 minutes  MANUAL: yes  PATIENT STATUS DURING PULL: stable  POST PULL SITE:  LEVEL 0  POST PULL INSTRUCTIONS GIVEN: yes  POST PULL PULSES PRESENT: bilateral pedal pulses at +2  DRESSING APPLIED: gauze with tegaderm  BEDREST BEGINS @ 1442  COMMENTS:

## 2022-08-01 NOTE — Anesthesia Procedure Notes (Signed)
Procedure Name: MAC Date/Time: 08/01/2022 11:17 AM  Performed by: Valda Favia, CRNAPre-anesthesia Checklist: Patient identified, Emergency Drugs available, Suction available, Patient being monitored and Timeout performed Patient Re-evaluated:Patient Re-evaluated prior to induction Oxygen Delivery Method: Simple face mask Placement Confirmation: positive ETCO2 Dental Injury: Teeth and Oropharynx as per pre-operative assessment

## 2022-08-01 NOTE — Progress Notes (Signed)
OVERNIGHT CROSS COVER NOTE:   Patient with concerns full bladder requesting foley. Bladder scan demonstrated 250 cc of urine. Straight cath with 400 cc of urine. Patient with history of BPH with TURP procedure in 2016; currently on doxazosin. Will leave foley in overnight.    Libby Maw, MD MS

## 2022-08-01 NOTE — Progress Notes (Signed)
Patient arrived to 4E from Cath Lab. Vitals taken and stable. Tele placed and CCMD notified. CHG completed. Patient oriented to unit and staff. Groin sites assessed and level 0. Call bell within reach.  Mark Zamora

## 2022-08-02 ENCOUNTER — Encounter (HOSPITAL_COMMUNITY): Payer: Self-pay | Admitting: Internal Medicine

## 2022-08-02 ENCOUNTER — Inpatient Hospital Stay (HOSPITAL_COMMUNITY): Payer: Medicare Other

## 2022-08-02 DIAGNOSIS — B0229 Other postherpetic nervous system involvement: Secondary | ICD-10-CM | POA: Diagnosis not present

## 2022-08-02 DIAGNOSIS — I350 Nonrheumatic aortic (valve) stenosis: Secondary | ICD-10-CM | POA: Diagnosis not present

## 2022-08-02 DIAGNOSIS — Z006 Encounter for examination for normal comparison and control in clinical research program: Secondary | ICD-10-CM | POA: Diagnosis not present

## 2022-08-02 DIAGNOSIS — Z952 Presence of prosthetic heart valve: Secondary | ICD-10-CM | POA: Diagnosis not present

## 2022-08-02 DIAGNOSIS — J449 Chronic obstructive pulmonary disease, unspecified: Secondary | ICD-10-CM | POA: Diagnosis not present

## 2022-08-02 LAB — CBC
HCT: 38.1 % — ABNORMAL LOW (ref 39.0–52.0)
Hemoglobin: 13.1 g/dL (ref 13.0–17.0)
MCH: 30.2 pg (ref 26.0–34.0)
MCHC: 34.4 g/dL (ref 30.0–36.0)
MCV: 87.8 fL (ref 80.0–100.0)
Platelets: 148 10*3/uL — ABNORMAL LOW (ref 150–400)
RBC: 4.34 MIL/uL (ref 4.22–5.81)
RDW: 14 % (ref 11.5–15.5)
WBC: 15.7 10*3/uL — ABNORMAL HIGH (ref 4.0–10.5)
nRBC: 0 % (ref 0.0–0.2)

## 2022-08-02 LAB — BASIC METABOLIC PANEL
Anion gap: 9 (ref 5–15)
BUN: 16 mg/dL (ref 8–23)
CO2: 24 mmol/L (ref 22–32)
Calcium: 8.9 mg/dL (ref 8.9–10.3)
Chloride: 104 mmol/L (ref 98–111)
Creatinine, Ser: 0.8 mg/dL (ref 0.61–1.24)
GFR, Estimated: >60 mL/min (ref >60)
Glucose, Bld: 123 mg/dL — ABNORMAL HIGH (ref 70–99)
Potassium: 3.2 mmol/L — ABNORMAL LOW (ref 3.5–5.1)
Sodium: 137 mmol/L (ref 135–145)

## 2022-08-02 LAB — ECHOCARDIOGRAM LIMITED
AR max vel: 2.92 cm2
AV Area VTI: 3.16 cm2
AV Area mean vel: 3.31 cm2
AV Mean grad: 7 mmHg
AV Peak grad: 13.8 mmHg
Ao pk vel: 1.86 m/s
Height: 64 in
Weight: 2800 oz

## 2022-08-02 MED ORDER — TAMSULOSIN HCL 0.4 MG PO CAPS
0.4000 mg | ORAL_CAPSULE | Freq: Every day | ORAL | Status: DC
Start: 2022-08-02 — End: 2022-08-03
  Administered 2022-08-02 – 2022-08-03 (×2): 0.4 mg via ORAL
  Filled 2022-08-02 (×2): qty 1

## 2022-08-02 MED ORDER — TAMSULOSIN HCL 0.4 MG PO CAPS: 0.8000 mg | ORAL_CAPSULE | Freq: Every day | ORAL | Status: DC

## 2022-08-02 MED ORDER — ONDANSETRON HCL 4 MG/2ML IJ SOLN: 4.0000 mg | Freq: Once | INTRAMUSCULAR | Status: DC

## 2022-08-02 MED ORDER — POTASSIUM CHLORIDE CRYS ER 20 MEQ PO TBCR
40.0000 meq | EXTENDED_RELEASE_TABLET | Freq: Once | ORAL | Status: AC
  Administered 2022-08-02: 40 meq via ORAL
  Filled 2022-08-02: qty 2

## 2022-08-02 NOTE — Progress Notes (Signed)
Echocardiogram 2D Echocardiogram has been performed.  [name] RDCS 08/02/2022, 11:19 AM

## 2022-08-02 NOTE — Progress Notes (Signed)
Attempted to ambulate patient, he refused to walk stating he felt dizzy and nauseous, B/P 134/76, HR 67. Albertine Grates RN 9/6/20239:38 AM

## 2022-08-02 NOTE — Progress Notes (Addendum)
[facility] VALVE TEAM  Patient Name: Mark Zamora Date of Encounter: 08/02/2022  Admit date: 08/01/2022  Primary Care Provider: Maury Dus, MD [facility] Cardiologist: Early Osmond, MD  [facility] Electrophysiologist:  None   Hospital Problem List     Principal Problem:   S/P TAVR (transcatheter aortic valve replacement) Active Problems:   BPH (benign prostatic hyperplasia)   OSA on CPAP   GERD (gastroesophageal reflux disease)   Essential hypertension   COPD (chronic obstructive pulmonary disease) ([place])   Severe aortic stenosis   Spinal stenosis     Subjective   Still having urinary retention and some nausea.   Inpatient Medications    Scheduled Meds:  amLODipine  5 mg Oral Daily   aspirin EC  81 mg Oral Daily   diclofenac  75 mg Oral BID   doxazosin  8 mg Oral QHS   famotidine  40 mg Oral QPM   fluticasone  2 spray Each Nare QHS   lisinopril  40 mg Oral Daily   melatonin  20 mg Oral QHS   mometasone-formoterol  2 puff Inhalation BID   pantoprazole  40 mg Oral Daily   potassium chloride SA  20 mEq Oral Daily   pravastatin  80 mg Oral QPM   sodium chloride flush  3 mL Intravenous Q12H   spironolactone  12.5 mg Oral Daily   tiZANidine  4 mg Oral TID   Continuous Infusions:  sodium chloride     nitroGLYCERIN     PRN Meds: sodium chloride, acetaminophen **OR** acetaminophen, hydrALAZINE, ondansetron (ZOFRAN) IV, oxyCODONE, sodium chloride flush, zolpidem   Vital Signs    Vitals:   08/01/22 2016 08/02/22 0035 08/02/22 0413 08/02/22 0727  BP: (!) 145/67 136/74 (!) 147/68 (!) 125/55  Pulse: 66 83 90 65  Resp: '16 17 14 18  '$ Temp: 98 F (36.7 C) 98.1 F (36.7 C) 98 F (36.7 C) 97.6 F (36.4 C)  TempSrc: Oral Oral Oral Oral  SpO2: 98% 98% 96% 96%  Weight:      Height:        Intake/Output Summary (Last 24 hours) at 08/02/2022 0938 Last data filed at 08/01/2022 1858 Gross per 24 hour  Intake  1802.41 ml  Output 950 ml  Net 852.41 ml   Filed Weights   08/01/22 0839  Weight: 79.4 kg    Physical Exam    GEN: Well nourished, well developed, in no acute distress.  HEENT: Grossly normal.  Neck: Supple, no JVD, carotid bruits, or masses. Cardiac: RRR, no murmurs, rubs, or gallops. No clubbing, cyanosis, edema.   Respiratory:  Respirations regular and unlabored, clear to auscultation bilaterally. GI: Soft, nontender, nondistended, BS + x 4. MS: no deformity or atrophy. Skin: warm and dry, no rash.  Groin sites clear without hematoma or ecchymosis  Neuro:  Strength and sensation are intact. Psych: AAOx3.  Normal affect.  Labs    CBC Recent Labs    08/01/22 1309 08/02/22 0748  WBC  --  15.7*  HGB 12.9* 13.1  HCT 38.0* 38.1*  MCV  --  87.8  PLT  --  626*   Basic Metabolic Panel Recent Labs    08/01/22 1309 08/02/22 0748  NA 143 137  K 3.4* 3.2*  CL 104 104  CO2  --  24  GLUCOSE 109* 123*  BUN 10 16  CREATININE 0.60* 0.80  CALCIUM  --  8.9   Liver Function Tests No  results for input(s): "AST", "ALT", "ALKPHOS", "BILITOT", "PROT", "ALBUMIN" in the last 72 hours. No results for input(s): "LIPASE", "AMYLASE" in the last 72 hours. Cardiac Enzymes No results for input(s): "CKTOTAL", "CKMB", "CKMBINDEX", "TROPONINI" in the last 72 hours. BNP Invalid input(s): "POCBNP" D-Dimer No results for input(s): "DDIMER" in the last 72 hours. Hemoglobin A1C No results for input(s): "HGBA1C" in the last 72 hours. Fasting Lipid Panel No results for input(s): "CHOL", "HDL", "LDLCALC", "TRIG", "CHOLHDL", "LDLDIRECT" in the last 72 hours. Thyroid Function Tests No results for input(s): "TSH", "T4TOTAL", "T3FREE", "THYROIDAB" in the last 72 hours.  Invalid input(s): "FREET3"  Telemetry    sinus - Personally Reviewed  ECG    Sinus with non specific ST/TW abnormality  - Personally Reviewed  Radiology    ECHOCARDIOGRAM LIMITED  Result Date: 08/01/2022     ECHOCARDIOGRAM LIMITED REPORT   Patient Name:   Mark Zamora Date of Exam: 08/01/2022 Medical Rec #:  [id number]          Height:       63.0 in Accession #:    [id number]         Weight:       179.4 lb Date of Birth:  1946/01/04         BSA:          1.846 m Patient Age:    76 years           BP:           189/76 mmHg Patient Gender: M                  HR:           75 bpm. Exam Location:  Inpatient Procedure: Limited Echo, Color Doppler and Cardiac Doppler Indications:     Aortic Stenosis i35.0  History:         Patient has prior history of Echocardiogram examinations, most                  recent 05/17/2022. COPD; Risk Factors:Hypertension, Dyslipidemia                  and Sleep Apnea.  Sonographer:     Raquel Sarna Senior RDCS Referring Phys:  [id number] Early Osmond Diagnosing Phys: Sanda Klein MD  Sonographer Comments: 61m Edwards [phone] Implanted  PRE-PROCEDURE FINDINGS Normal left ventricular systolic function. Estimated LVEF 55% Trileaflet aortic valve with severe calcific stenosis. Aortic valve peak gradient 60 mm Hg, mean gradient 35 mm Hg, dimensionless index 0.18, calculated valve area 0.63 cm sq (0.34 cm sq/m sq BSA). No aortic insufficiency. No pericardial effusion. POST-PROCEDURE FINDINGS Normal left ventricular systolic function. Estimated LVEF 65% Well deployed stent valve ( 29 mm Edwards S3U TAVR). Aortic valve peak gradient 7 mm Hg, mean gradient 4 mm Hg, dimensionless index .75, calculated valve area 2.87 cm sq (1.55 cm sq/m sq BSA), acceleration time 60 ms. No aortic insufficiency or perivalvular leak. No pericardial effusion.  IMPRESSIONS  1. Left ventricular ejection fraction, by estimation, is 55 to 60%. The left ventricle has normal function. The left ventricle demonstrates regional wall motion abnormalities (see scoring diagram/findings for description). There is moderate concentric left ventricular hypertrophy. There is mild hypokinesis of the left ventricular, basal lateral wall and  inferolateral wall.  2. Right ventricular systolic function is normal. The right ventricular size is normal.  3. Left atrial size was mild to moderately dilated.  4. The mitral valve is normal in  structure. No evidence of mitral valve regurgitation. No evidence of mitral stenosis. FINDINGS  Left Ventricle: Left ventricular ejection fraction, by estimation, is 55 to 60%. The left ventricle has normal function. The left ventricle demonstrates regional wall motion abnormalities. Mild hypokinesis of the left ventricular, basal lateral wall and  inferolateral wall. The left ventricular internal cavity size was normal in size. There is moderate concentric left ventricular hypertrophy. Right Ventricle: The right ventricular size is normal. No increase in right ventricular wall thickness. Right ventricular systolic function is normal. Left Atrium: Left atrial size was mild to moderately dilated. Right Atrium: Right atrial size was not well visualized. Pericardium: There is no evidence of pericardial effusion. Mitral Valve: The mitral valve is normal in structure. Mild mitral annular calcification. No evidence of mitral valve stenosis. Tricuspid Valve: The tricuspid valve is grossly normal. Aortic Valve: Aortic valve mean gradient measures 4.0 mmHg. Aortic valve peak gradient measures 7.4 mmHg. Aortic valve area, by VTI measures 2.87 cm. Pulmonic Valve: The pulmonic valve was not well visualized. Aorta: The aortic root is normal in size and structure. LEFT VENTRICLE PLAX 2D LVOT diam:     2.20 cm LV SV:         82 LV SV Index:   44 LVOT Area:     3.80 cm  AORTIC VALVE AV Area (Vmax):    2.93 cm AV Area (Vmean):   2.86 cm AV Area (VTI):     2.87 cm AV Vmax:           136.00 cm/s AV Vmean:          89.500 cm/s AV VTI:            0.285 m AV Peak Grad:      7.4 mmHg AV Mean Grad:      4.0 mmHg LVOT Vmax:         105.00 cm/s LVOT Vmean:        67.300 cm/s LVOT VTI:          0.215 m LVOT/AV VTI ratio: 0.75  SHUNTS Systemic  VTI:  0.22 m Systemic Diam: 2.20 cm Sanda Klein MD Electronically signed by Sanda Klein MD Signature Date/Time: 08/01/2022/2:27:14 PM    Final    Structural Heart Procedure  Result Date: 08/01/2022 See surgical note for result.   Cardiac Studies   TAVR OPERATIVE NOTE     Date of Procedure:                08/01/22   Preoperative Diagnosis:      Severe Aortic Stenosis    Postoperative Diagnosis:    Same    Procedure:        Transcatheter Aortic Valve Replacement - Percutaneous Right Transfemoral Approach             Edwards Sapien 3 Ultra THV (size 29 mm, model # [id number] serial # [id number])              Co-Surgeons:            Coralie Common MD  and Lenna Sciara, MD     Anesthesiologist:                  Rochele Pages MD   Echocardiographer:              Sanda Klein MD   Pre-operative Echo Findings: Severe aortic stenosis Normal  left ventricular systolic function   Post-operative Echo Findings: Np paravalvular leak Normal left ventricular systolic  function   _____________     Echo 08/02/22: pending  Patient Profile    JARRAH BABICH is a 76 y.o. male with a history of COPD, spinal stenosis, AAA, HTN, OSA on CPAP and severe aortic stenosis who presented to [facility] on 08/01/22 for planned TAVR.   Assessment & Plan    Severe AS: s/p successful TAVR with a 29 mm Edwards Sapien 3 Ultra Resilia THV via the TF approach on 08/01/22. Post operative pending. Groin sites are stable. ECG with sinus and no high grade heart block. Resumed on home Asprin '81mg'$  daily. Having issues with acute urinary retention and nausea. Will check back on him later this afternoon   HTN: Bp well controlled today. Continue home meds.   Acute urinary retention: continued on home doxazosin. Given one dose of tamsulosin. Foley removed this AM. Patient still having issues with this. Likely anesthesia related. Spoke to urology who recommended stopping doxazosin and start tamsulosin  N/V: improving.  Continue IV Zofran. White count noted to be elevated ~16K. Continue to monitor.   Hypokalemia: K 3.2. Will replete.  Signed, Angelena Form, PA-C  08/02/2022, 9:38 AM  Pager [phone]   ATTENDING ATTESTATION:  After conducting a review of all available clinical information with the care team, interviewing the patient, and performing a physical exam, I agree with the findings and plan described in this note.   GEN: No acute distress.   HEENT:  MMM, no JVD, no scleral icterus Cardiac: RRR, no murmurs, rubs, or gallops.  Respiratory: Clear to auscultation bilaterally. GI: Soft, nontender, non-distended  MS: No edema; No deformity. Neuro:  Nonfocal  Vasc:  +2 radial pulses; access sites CDI  Patient's status post uncomplicated TAVR yesterday with a 29 mm SAPIEN 3 valve from the right transfemoral approach.  The patient reports urinary retention and nausea.  His white count is mildly elevated however he has no signs or symptoms of systemic infection.  He remains afebrile.  His laboratories are otherwise unremarkable.  We will trial up tamsulosin and stop the doxazosin to see if this will enhance his urinary function.  I believe his nausea is likely due to altered gastric motility likely related to anesthesia.  He is having no abdominal pain.  Low threshold to get a KUB to evaluate for small bowel obstruction if symptoms dictate.  For now we will monitor.  He could be discharged later today if he feels better otherwise.  Follow-up echocardiogram and continue aspirin.  Lenna Sciara, MD Pager [phone]

## 2022-08-02 NOTE — Progress Notes (Addendum)
Mobility Specialist Progress Note:   08/02/22 1230  Mobility  Activity Ambulated with assistance in room  Level of Assistance Minimal assist, patient does 75% or more  Assistive Device Front wheel walker  Distance Ambulated (ft) 30 ft  Activity Response Tolerated well  $Mobility charge 1 Mobility   Pt received in bed willing to participate in mobility. No complaints of pain. Pt stated he was dizzy when sitting EOB (BP 126/77). MinA to stand then stand-by throughout walk. Left in bed with call bell in reach and all needs met.   [facility] Jalee Saine Mobility Specialist

## 2022-08-02 NOTE — Progress Notes (Signed)
Attempted to ambulate patient, states he was still having some dizziness when ambulating to bathroom, refused to walk today, was able to eat lunch, states nausea has improved. Patient educated on risk factors, exercise guidelines, catheterization site care, restrictions, nutrition; cardiac rehab phase 2 discussed.  [place] RN 9/6/20231:25 PM

## 2022-08-02 NOTE — Plan of Care (Signed)

## 2022-08-03 LAB — BASIC METABOLIC PANEL
Anion gap: 6 (ref 5–15)
BUN: 19 mg/dL (ref 8–23)
CO2: 25 mmol/L (ref 22–32)
Calcium: 8.9 mg/dL (ref 8.9–10.3)
Chloride: 106 mmol/L (ref 98–111)
Creatinine, Ser: 0.84 mg/dL (ref 0.61–1.24)
GFR, Estimated: >60 mL/min (ref >60)
Glucose, Bld: 99 mg/dL (ref 70–99)
Potassium: 3.4 mmol/L — ABNORMAL LOW (ref 3.5–5.1)
Sodium: 137 mmol/L (ref 135–145)

## 2022-08-03 LAB — CBC
HCT: 33.8 % — ABNORMAL LOW (ref 39.0–52.0)
Hemoglobin: 11.2 g/dL — ABNORMAL LOW (ref 13.0–17.0)
MCH: 29.6 pg (ref 26.0–34.0)
MCHC: 33.1 g/dL (ref 30.0–36.0)
MCV: 89.4 fL (ref 80.0–100.0)
Platelets: 118 10*3/uL — ABNORMAL LOW (ref 150–400)
RBC: 3.78 MIL/uL — ABNORMAL LOW (ref 4.22–5.81)
RDW: 14.4 % (ref 11.5–15.5)
WBC: 12.4 10*3/uL — ABNORMAL HIGH (ref 4.0–10.5)
nRBC: 0 % (ref 0.0–0.2)

## 2022-08-03 MED ORDER — POTASSIUM CHLORIDE CRYS ER 20 MEQ PO TBCR
40.0000 meq | EXTENDED_RELEASE_TABLET | Freq: Once | ORAL | Status: AC
Start: 2022-08-03 — End: 2022-08-03
  Administered 2022-08-03: 40 meq via ORAL
  Filled 2022-08-03: qty 2

## 2022-08-03 NOTE — Progress Notes (Signed)
Mobility Specialist Progress Note:   08/03/22 1000  Mobility  Activity Stood at bedside  Level of Assistance Independent  Assistive Device None  Activity Response Tolerated well  $Mobility charge 1 Mobility   Orthostatic BPs BPs  HR  Supine 154/70 64  Sitting 139/74 58  Standing 120/71 59        Pt received in bed willing to participate in mobility. Pt ambulating to BR independently. Denies dizziness/ lightheadedness. Left in bed with call bell in reach and all needs met.   [facility] Jonty Morrical Mobility Specialist

## 2022-08-03 NOTE — Progress Notes (Signed)
Pt is still complaining of dizziness when getting up. Pt stated he is leaning more toward left side when he walks. Nero check intact. No numbness or tingling of legs, or arms. Patient fells backwards while using bathroom. Call bell within reach. Plan of care continues.

## 2022-08-03 NOTE — Progress Notes (Signed)
CARDIAC REHAB PHASE I   PRE:  Rate/Rhythm: 62 SR  MODE:  Ambulation: 350 ft   POST:  Rate/Rhythm: 85 SR   Pt ambulated 349f in hallway assist of one with front wheel walker. Pt states he feels much better today, breathing improved. Reviewed site care, restrictions, and exercise guidelines. Pt appreciative of the care he has received.   [id number] Falco RN BSN 08/03/2022 1:42 PM

## 2022-08-03 NOTE — Plan of Care (Signed)
  Problem: Education: Goal: Knowledge of General Education information will improve Description: Including pain rating scale, medication(s)/side effects and non-pharmacologic comfort measures Outcome: Progressing   Problem: Health Behavior/Discharge Planning: Goal: Ability to manage health-related needs will improve Outcome: Progressing   Problem: Clinical Measurements: Goal: Ability to maintain clinical measurements within normal limits will improve Outcome: Progressing Goal: Will remain free from infection Outcome: Progressing Goal: Cardiovascular complication will be avoided Outcome: Progressing   Problem: Activity: Goal: Risk for activity intolerance will decrease Outcome: Progressing   Problem: Pain Managment: Goal: General experience of comfort will improve Outcome: Progressing   

## 2022-08-03 NOTE — Progress Notes (Signed)
CARDIAC REHAB PHASE I   Went to offer to walk with pt. Pt getting cleaned up. Feels much better today. Has d/c order but does not have a ride until later today. Will f/u to encourage ambulation as able.  Rufina Falco, RN BSN 08/03/2022 9:24 AM

## 2022-08-03 NOTE — Plan of Care (Signed)

## 2022-08-04 ENCOUNTER — Telehealth: Payer: Self-pay

## 2022-08-04 NOTE — Telephone Encounter (Signed)
Patient contacted regarding discharge from [facility] on 08/03/2022.  Patient understands to follow up with provider Structural Heart APP on 08/09/2022 at 3:30 PM at [facility] office. Patient understands discharge instructions? yes Patient understands medications and regiment? yes Patient understands to bring all medications to this visit? Yes  The pt is doing okay but still feels like he is moving slowly.  I advised the pt of post operative restrictions and reminded him to not try and over do it this first week.  The pt does complain of dizziness which he experienced during hospitalization. Per pt his BP is normal today.  I reminded him to use his cane with ambulation and to change position slowly. The pt was provided with the phone number for after hours if further issues arise over the weekend.

## 2022-08-06 ENCOUNTER — Other Ambulatory Visit: Payer: Self-pay

## 2022-08-06 ENCOUNTER — Telehealth: Payer: Self-pay | Admitting: Student

## 2022-08-06 ENCOUNTER — Inpatient Hospital Stay (HOSPITAL_COMMUNITY)
Admission: EM | Admit: 2022-08-06 | Discharge: 2022-08-10 | DRG: 091 | Disposition: A | Discharge status: Home Health | Payer: Medicare Other | Attending: Internal Medicine | Admitting: Internal Medicine

## 2022-08-06 ENCOUNTER — Encounter (HOSPITAL_COMMUNITY): Payer: Self-pay | Admitting: *Deleted

## 2022-08-06 ENCOUNTER — Emergency Department (HOSPITAL_COMMUNITY): Payer: Medicare Other

## 2022-08-06 DIAGNOSIS — I63441 Cerebral infarction due to embolism of right cerebellar artery: Secondary | ICD-10-CM | POA: Diagnosis present

## 2022-08-06 DIAGNOSIS — I63233 Cerebral infarction due to unspecified occlusion or stenosis of bilateral carotid arteries: Secondary | ICD-10-CM | POA: Diagnosis not present

## 2022-08-06 DIAGNOSIS — E785 Hyperlipidemia, unspecified: Secondary | ICD-10-CM | POA: Diagnosis not present

## 2022-08-06 DIAGNOSIS — I639 Cerebral infarction, unspecified: Secondary | ICD-10-CM | POA: Diagnosis not present

## 2022-08-06 DIAGNOSIS — H5340 Unspecified visual field defects: Secondary | ICD-10-CM | POA: Diagnosis not present

## 2022-08-06 DIAGNOSIS — Z87891 Personal history of nicotine dependence: Secondary | ICD-10-CM | POA: Diagnosis not present

## 2022-08-06 DIAGNOSIS — F32A Depression, unspecified: Secondary | ICD-10-CM | POA: Diagnosis not present

## 2022-08-06 DIAGNOSIS — R233 Spontaneous ecchymoses: Secondary | ICD-10-CM | POA: Diagnosis not present

## 2022-08-06 DIAGNOSIS — K5900 Constipation, unspecified: Secondary | ICD-10-CM | POA: Diagnosis not present

## 2022-08-06 DIAGNOSIS — Z83438 Family history of other disorder of lipoprotein metabolism and other lipidemia: Secondary | ICD-10-CM

## 2022-08-06 DIAGNOSIS — I493 Ventricular premature depolarization: Secondary | ICD-10-CM | POA: Diagnosis not present

## 2022-08-06 DIAGNOSIS — Z7951 Long term (current) use of inhaled steroids: Secondary | ICD-10-CM

## 2022-08-06 DIAGNOSIS — Z9841 Cataract extraction status, right eye: Secondary | ICD-10-CM

## 2022-08-06 DIAGNOSIS — Z954 Presence of other heart-valve replacement: Secondary | ICD-10-CM | POA: Diagnosis not present

## 2022-08-06 DIAGNOSIS — Z79899 Other long term (current) drug therapy: Secondary | ICD-10-CM

## 2022-08-06 DIAGNOSIS — Z888 Allergy status to other drugs, medicaments and biological substances status: Secondary | ICD-10-CM

## 2022-08-06 DIAGNOSIS — Z7982 Long term (current) use of aspirin: Secondary | ICD-10-CM

## 2022-08-06 DIAGNOSIS — R29700 NIHSS score 0: Secondary | ICD-10-CM | POA: Diagnosis not present

## 2022-08-06 DIAGNOSIS — Z96642 Presence of left artificial hip joint: Secondary | ICD-10-CM | POA: Diagnosis present

## 2022-08-06 DIAGNOSIS — Z823 Family history of stroke: Secondary | ICD-10-CM

## 2022-08-06 DIAGNOSIS — I97820 Postprocedural cerebrovascular infarction during cardiac surgery: Secondary | ICD-10-CM | POA: Diagnosis not present

## 2022-08-06 DIAGNOSIS — H539 Unspecified visual disturbance: Principal | ICD-10-CM

## 2022-08-06 DIAGNOSIS — Z885 Allergy status to narcotic agent status: Secondary | ICD-10-CM

## 2022-08-06 DIAGNOSIS — I6782 Cerebral ischemia: Secondary | ICD-10-CM | POA: Diagnosis not present

## 2022-08-06 DIAGNOSIS — Z953 Presence of xenogenic heart valve: Secondary | ICD-10-CM

## 2022-08-06 DIAGNOSIS — G4733 Obstructive sleep apnea (adult) (pediatric): Secondary | ICD-10-CM | POA: Diagnosis present

## 2022-08-06 DIAGNOSIS — R9089 Other abnormal findings on diagnostic imaging of central nervous system: Secondary | ICD-10-CM | POA: Diagnosis not present

## 2022-08-06 DIAGNOSIS — Z9989 Dependence on other enabling machines and devices: Secondary | ICD-10-CM | POA: Diagnosis not present

## 2022-08-06 DIAGNOSIS — Y838 Other surgical procedures as the cause of abnormal reaction of the patient, or of later complication, without mention of misadventure at the time of the procedure: Secondary | ICD-10-CM | POA: Diagnosis present

## 2022-08-06 DIAGNOSIS — R2681 Unsteadiness on feet: Secondary | ICD-10-CM | POA: Diagnosis present

## 2022-08-06 DIAGNOSIS — J449 Chronic obstructive pulmonary disease, unspecified: Secondary | ICD-10-CM | POA: Diagnosis not present

## 2022-08-06 DIAGNOSIS — Z9842 Cataract extraction status, left eye: Secondary | ICD-10-CM

## 2022-08-06 DIAGNOSIS — R29703 NIHSS score 3: Secondary | ICD-10-CM | POA: Diagnosis not present

## 2022-08-06 DIAGNOSIS — Z9079 Acquired absence of other genital organ(s): Secondary | ICD-10-CM

## 2022-08-06 DIAGNOSIS — M47812 Spondylosis without myelopathy or radiculopathy, cervical region: Secondary | ICD-10-CM | POA: Diagnosis present

## 2022-08-06 DIAGNOSIS — N401 Enlarged prostate with lower urinary tract symptoms: Secondary | ICD-10-CM | POA: Diagnosis present

## 2022-08-06 DIAGNOSIS — I700 Atherosclerosis of aorta: Secondary | ICD-10-CM | POA: Diagnosis not present

## 2022-08-06 DIAGNOSIS — Z85828 Personal history of other malignant neoplasm of skin: Secondary | ICD-10-CM | POA: Diagnosis not present

## 2022-08-06 DIAGNOSIS — R338 Other retention of urine: Secondary | ICD-10-CM | POA: Diagnosis not present

## 2022-08-06 DIAGNOSIS — F419 Anxiety disorder, unspecified: Secondary | ICD-10-CM | POA: Diagnosis present

## 2022-08-06 DIAGNOSIS — R29701 NIHSS score 1: Secondary | ICD-10-CM | POA: Diagnosis not present

## 2022-08-06 DIAGNOSIS — E876 Hypokalemia: Secondary | ICD-10-CM | POA: Diagnosis not present

## 2022-08-06 DIAGNOSIS — M542 Cervicalgia: Secondary | ICD-10-CM | POA: Diagnosis present

## 2022-08-06 DIAGNOSIS — I10 Essential (primary) hypertension: Secondary | ICD-10-CM | POA: Diagnosis present

## 2022-08-06 DIAGNOSIS — M25552 Pain in left hip: Secondary | ICD-10-CM | POA: Diagnosis present

## 2022-08-06 DIAGNOSIS — M19072 Primary osteoarthritis, left ankle and foot: Secondary | ICD-10-CM | POA: Diagnosis present

## 2022-08-06 DIAGNOSIS — I614 Nontraumatic intracerebral hemorrhage in cerebellum: Secondary | ICD-10-CM | POA: Diagnosis not present

## 2022-08-06 DIAGNOSIS — G4700 Insomnia, unspecified: Secondary | ICD-10-CM | POA: Diagnosis present

## 2022-08-06 DIAGNOSIS — I6389 Other cerebral infarction: Secondary | ICD-10-CM | POA: Diagnosis not present

## 2022-08-06 DIAGNOSIS — Z952 Presence of prosthetic heart valve: Secondary | ICD-10-CM | POA: Diagnosis not present

## 2022-08-06 DIAGNOSIS — R220 Localized swelling, mass and lump, head: Secondary | ICD-10-CM | POA: Diagnosis not present

## 2022-08-06 DIAGNOSIS — M549 Dorsalgia, unspecified: Secondary | ICD-10-CM | POA: Diagnosis present

## 2022-08-06 DIAGNOSIS — Z743 Need for continuous supervision: Secondary | ICD-10-CM | POA: Diagnosis not present

## 2022-08-06 DIAGNOSIS — G8929 Other chronic pain: Secondary | ICD-10-CM | POA: Diagnosis present

## 2022-08-06 DIAGNOSIS — K219 Gastro-esophageal reflux disease without esophagitis: Secondary | ICD-10-CM | POA: Diagnosis not present

## 2022-08-06 DIAGNOSIS — H538 Other visual disturbances: Secondary | ICD-10-CM | POA: Diagnosis not present

## 2022-08-06 DIAGNOSIS — H43399 Other vitreous opacities, unspecified eye: Secondary | ICD-10-CM | POA: Diagnosis not present

## 2022-08-06 DIAGNOSIS — H5330 Unspecified disorder of binocular vision: Secondary | ICD-10-CM | POA: Diagnosis not present

## 2022-08-06 DIAGNOSIS — Z8679 Personal history of other diseases of the circulatory system: Secondary | ICD-10-CM

## 2022-08-06 MED ORDER — TETRACAINE HCL 0.5 % OP SOLN: 1.0000 [drp] | Freq: Once | OPHTHALMIC | Status: DC

## 2022-08-06 MED ORDER — MELATONIN 5 MG PO TABS
10.0000 mg | ORAL_TABLET | Freq: Every day | ORAL | Status: DC
  Administered 2022-08-07 – 2022-08-09 (×3): 10 mg via ORAL
  Filled 2022-08-06 (×3): qty 2

## 2022-08-06 MED ORDER — ZOLPIDEM TARTRATE 5 MG PO TABS: 10.0000 mg | ORAL_TABLET | Freq: Every evening | ORAL | Status: DC | PRN

## 2022-08-06 MED ORDER — ZOLPIDEM TARTRATE 5 MG PO TABS: 5.0000 mg | ORAL_TABLET | Freq: Every evening | ORAL | Status: DC | PRN

## 2022-08-06 NOTE — Telephone Encounter (Signed)
   Patient called the answering service with concerns about vision changes.  Called and spoke with patient.  He had a TAVR earlier this week and then they noticed stents to black and pink spots in his eyes as well as some blurry vision in the eye.  He states his BP is also markedly elevated systolic BP in the 654Y on the phone.  He is initially concerned about retinal detachment.  He denies any other strokelike symptoms.  Given sudden unilateral vision changes and significantly elevated BP after recent surgery, I strongly recommended patient go to the ED to rule out stroke.  He was hesitant of this at first but then agreed.  He did not want to call 911 as this was very expensive but his son will drive him to the ED.  Darreld Mclean, PA-C 08/06/2022 1:47 PM

## 2022-08-06 NOTE — ED Notes (Signed)
Pt coming up to nurses desk asking for the physician. I have told the pt multiple times that the MD is speaking with other people at the moment. Patient continues to roll eyes and walk off during explanation.

## 2022-08-06 NOTE — ED Provider Notes (Addendum)
[facility] EMERGENCY DEPARTMENT Provider Note   CSN: [id number] Arrival date & time: 08/06/22  1810     History  Chief Complaint  Patient presents with  . Eye Problem    MCCLELLAN DEMARAIS is a 76 y.o. male.   Eye Problem  Pt is a 76 year old male w hx of BPH, GERD, HTN, COPD, recent TAVR replacement   Patient 76 year old male presenting with vision changes in R eye for >24 hours. Pt states yesterday morning he began noticing changes in R eye vision.  He describes it as numerous black dots and some pinkish dots but also describes the vision as foggy.  Denies any head injuries or neck injuries.  Denies any neck pain headache slurred speech confusion numbness or weakness in any extremities or in his face.  Denies any drooling difficulty swallowing or any pain at all.  His states that his vision has gradually worsened somewhat.  This prompted him to go to [facility] emergency department. He was transferred POV here for MRI.      Home Medications Prior to Admission medications   Medication Sig Start Date End Date Taking? Authorizing Provider  acetaminophen (TYLENOL) 650 MG CR tablet Take 1,300 mg by mouth every 8 (eight) hours as needed for pain.    [provider]  albuterol (VENTOLIN HFA) 108 (90 Base) MCG/ACT inhaler Inhale 1-2 puffs into the lungs every 6 (six) hours as needed for shortness of breath or wheezing. 04/10/22   [provider]  amLODipine (NORVASC) 5 MG tablet Take 5 mg by mouth daily.    [provider]  aspirin EC 81 MG tablet Take 1 tablet (81 mg total) by mouth daily. Swallow whole. 05/22/22   Early Osmond, MD  budesonide-formoterol (SYMBICORT) 160-4.5 MCG/ACT inhaler Inhale 2 puffs into the lungs 2 (two) times daily.    [provider]  Cholecalciferol (VITAMIN D3) 50 MCG (2000 UT) capsule Take 2,000 Units by mouth daily.    [provider]  Dextromethorphan-guaiFENesin (MUCINEX DM MAXIMUM STRENGTH)  60-1200 MG TB12 Take 1 tablet by mouth at bedtime.    [provider]  diclofenac (VOLTAREN) 75 MG EC tablet Take 75 mg by mouth 2 (two) times daily. 06/13/22   [provider]  doxazosin (CARDURA) 8 MG tablet Take 8 mg by mouth at bedtime.    [provider]  famotidine (PEPCID) 40 MG tablet Take 40 mg by mouth every evening.    [provider]  fluticasone (FLONASE) 50 MCG/ACT nasal spray Place 2 sprays into both nostrils at bedtime.    [provider]  lisinopril (PRINIVIL,ZESTRIL) 40 MG tablet Take 40 mg by mouth daily.    [provider]  Melatonin 10 MG TABS Take 20 mg by mouth at bedtime.    [provider]  Multiple Vitamin (MULTIVITAMIN) capsule Take 1 capsule by mouth daily.    [provider]  NON FORMULARY Pt uses a cpap nightly    [provider]  omeprazole (PRILOSEC) 20 MG capsule Take 20 mg by mouth every morning.     [provider]  potassium chloride SA (K-DUR,KLOR-CON) 20 MEQ tablet Take 20 mEq by mouth daily.     [provider]  pravastatin (PRAVACHOL) 80 MG tablet Take 80 mg by mouth every evening.     [provider]  Psyllium (METAMUCIL) 28.3 % POWD Take 1 Scoop by mouth daily.    [provider]  spironolactone (ALDACTONE) 25 MG  tablet Take 6.25 mg by mouth daily.    [provider]  tiZANidine (ZANAFLEX) 4 MG capsule Take 1 capsule (4 mg total) by mouth 3 (three) times daily. 12/13/17   Perkins, Alexzandrew L, PA-C  traMADol (ULTRAM) 50 MG tablet Take 1-2 tablets (50-100 mg total) by mouth every 6 (six) hours as needed (mild pain). 12/13/17   Perkins, Alexzandrew L, PA-C  vitamin E 180 MG (400 UNITS) capsule Take 400 Units by mouth daily.    [provider]  zolpidem (AMBIEN CR) 12.5 MG CR tablet Take 12.5 mg by mouth at bedtime.    [provider]      Allergies    Compazine [prochlorperazine edisylate], Cymbalta [duloxetine  hcl], Promethazine, Codeine, and Other    Review of Systems   Review of Systems  Physical Exam Updated Vital Signs BP (!) 187/89   Pulse 73   Temp 98.1 F (36.7 C)   Resp 16   Ht '5\' 4"'$  (1.626 m)   Wt 80.2 kg   SpO2 96%   BMI 30.35 kg/m  Physical Exam Vitals and nursing note reviewed.  Constitutional:      General: He is not in acute distress. HENT:     Head: Normocephalic and atraumatic.     Nose: Nose normal.  Eyes:     General: No scleral icterus.    Comments: Pupils symmetric and without eccentric or concentric photophobia  Pupils reactive light  EOMI  No visual field cuts -- each eye checked independently  Cardiovascular:     Rate and Rhythm: Normal rate and regular rhythm.     Pulses: Normal pulses.     Heart sounds: Normal heart sounds.  Pulmonary:     Effort: Pulmonary effort is normal. No respiratory distress.     Breath sounds: No wheezing.  Abdominal:     Palpations: Abdomen is soft.     Tenderness: There is no abdominal tenderness.  Musculoskeletal:     Cervical back: Normal range of motion.     Right lower leg: No edema.     Left lower leg: No edema.  Skin:    General: Skin is warm and dry.     Capillary Refill: Capillary refill takes less than 2 seconds.  Neurological:     Mental Status: He is alert. Mental status is at baseline.     Comments: Alert and oriented to self, place, time and event.   Speech is fluent, clear without dysarthria or dysphasia.   Strength 5/5 in upper/lower extremities   Sensation intact in upper/lower extremities   Gait at baseline for pt (uses cane) Negative Romberg. No pronator drift.  Normal finger-to-nose and feet tapping.  CN I not tested  CN II grossly intact visual fields bilaterally. Did not visualize posterior eye.  CN III, IV, VI PERRLA and EOMs intact bilaterally  CN V Intact sensation to sharp and light touch to the face  CN VII facial movements symmetric  CN VIII not tested  CN IX, X no uvula  deviation, symmetric rise of soft palate  CN XI 5/5 SCM and trapezius strength bilaterally  CN XII Midline tongue protrusion, symmetric L/R movements   Psychiatric:        Mood and Affect: Mood normal.        Behavior: Behavior normal.     ED Results / Procedures / Treatments   Labs (all labs ordered are listed, but only abnormal results are displayed) Labs Reviewed - No data to display  EKG EKG Interpretation  Date/Time:  Sunday August 06 2022 18:23:42 EDT Ventricular Rate:  64 PR Interval:  166 QRS Duration: 112 QT Interval:  426 QTC Calculation: 440 R Axis:   61 Text Interpretation: Sinus rhythm Borderline intraventricular conduction delay similar to prior Confirmed by Wynona Dove ([id number]) on 08/06/2022 11:44:43 PM  Radiology No results found.  Procedures Procedures    Medications Ordered in ED Medications  melatonin tablet 10 mg (has no administration in time range)  zolpidem (AMBIEN) tablet 10 mg (has no administration in time range)  gadobutrol (GADAVIST) 1 MMOL/ML injection 8 mL (8 mLs Intravenous Contrast Given 08/07/22 0016)    ED Course/ Medical Decision Making/ A&P Clinical Course as of 08/07/22 0149  Sun Aug 06, 2022  1907 CLINICAL DATA: Floaters and spots right thigh 3-4 days. Aortic surgery 08/02/2022.  EXAM: CT HEAD WITHOUT CONTRAST  TECHNIQUE: Contiguous axial images were obtained from the base of the skull through the vertex without intravenous contrast.  RADIATION DOSE REDUCTION: This exam was performed according to the departmental dose-optimization program which includes automated exposure control, adjustment of the mA and/or kV according to patient size and/or use of iterative reconstruction technique.  COMPARISON: None Available.  FINDINGS: Brain: Ventricles, cisterns and other CSF spaces are within normal. Moderate chronic ischemic microvascular disease is present. Old lacunar infarct versus prominent perivascular space along  the inferior aspect of the right lentiform nucleus. Moderate bilateral patchy areas of low-attenuation over the cerebellum with possible mild associated local mass effect. Small peripheral more low density focus over the left upper cerebellum likely an old infarct. No evidence of midline shift. No acute hemorrhage.  Vascular: No hyperdense vessel or unexpected calcification.  Skull: Normal. Negative for fracture or focal lesion.  Sinuses/Orbits: Orbits are normal. Sinuses are well aerated.  Other: None.  IMPRESSION: 1. Moderate bilateral patchy indeterminate areas of low-attenuation over the cerebellum. Recommend further characterization with MRI of the brain. 2. 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   Electronically Signed By: Marin Olp M.D. On: 08/06/2022 15:32   [WF]  2000 Discussed with Dr. Cheral Marker of neurology MRI brain w/ wo and orbits w/ wo [WF]  2112 MRI 1-1.5 hours from now ~ before midnight (est 10:45) [WF]    Clinical Course User Index [WF] Tedd Sias, PA                           Medical Decision Making Amount and/or Complexity of Data Reviewed Radiology: ordered.  Risk OTC drugs. Prescription drug management.   This patient presents to the ED for concern of diminished R eye vision, this involves a number of treatment options, and is a complaint that carries with it a moderate to high risk of complications and morbidity.  The differential diagnosis includes stroke, optic neuritis, less likely GCA given no headache, retinal pathology (detachment, hemorrhage)   Co morbidities: Discussed in HPI   Brief History:  Pt is a 76 year old male w hx of BPH, GERD, HTN, COPD, recent TAVR replacement   Patient 76 year old male presenting with vision changes in R eye for >24 hours. Pt states yesterday morning he began noticing changes in R eye vision.  He describes it as numerous black dots and  some pinkish dots but also describes the vision as foggy.  Denies any head injuries or neck injuries.  Denies any neck pain headache slurred speech confusion numbness or weakness in  any extremities or in his face.  Denies any drooling difficulty swallowing or any pain at all.  His states that his vision has gradually worsened somewhat.  This prompted him to go to [facility] emergency department. He was transferred POV here for MRI.    EMR reviewed including pt PMHx, past surgical history and past visits to ER.   See HPI for more details   Lab Tests:                                  Imaging Studies:  CT head from [facility] radiology interpretation was reviewed.  No acute abnormalities, old infarct in cerebellum    Cardiac Monitoring:  .NA .EKG non-ischemic   Medicines ordered:  As needed Ambien and melatonin for sleep as needed  Critical Interventions:  .  Consults/Attending Physician   .I discussed this case with my attending physician who cosigned this note including patient's presenting symptoms, physical exam, and planned diagnostics and interventions. Attending physician stated agreement with plan or made changes to plan which were implemented.   Attending physician assessed patient at bedside and performed physical exam   I discussed with Dr. Cheral Marker of neurology who recommended MRI brain with and without contrast and MRI orbits with and without contrast to assess for intracranial pathology.  I discussed with Dr. Posey Pronto of ophthalmology who recommends follow-up in office tomorrow once cleared and DC-ed from hospital.     Reevaluation:  After the interventions noted above I re-evaluated patient and found that they have :stayed the same   Social Determinants of Health:  .    Problem List / ED Course:  Right eye vision change - Neurology recommends MRI brain and orbits with and without contrast prior to  discharge.   Dispostion:  Discussed with night shift PA who will follow up on MRI findings. Anticipate DC w FU w ophthalmology tomorrow.   Final Clinical Impression(s) / ED Diagnoses Final diagnoses:  Change in vision    Rx / DC Orders ED Discharge Orders     None         Tedd Sias, [place] 08/07/22 0017    Tedd Sias, PA 08/07/22 0149    Jeanell Sparrow, DO 08/11/22 0725

## 2022-08-06 NOTE — ED Triage Notes (Signed)
The pt was transferred from [facility] ed  the pt arrived by [facility] ems for  visual problems in his rt eye since this am  he had a c-t head that showed  abnormal  findings a and o x 4   heart surgery on this past wednesday

## 2022-08-06 NOTE — ED Notes (Signed)
10/32 bilaterally, via visual acuity.

## 2022-08-06 NOTE — Discharge Instructions (Addendum)
Stroke Nutrition Therapy Lowering/controlling your blood pressure helps to lower your risk for stroke. This eating plan is low in sodium (which comes mostly from salt). You should have plenty of vegetables, fruits, whole grains, and fat-free or low-fat dairy products. These foods contain nutrients that can help keep blood pressure under control. You should eat heart-healthy kinds of fat to reduce the buildup of plaque in your blood vessels. If you need to lose weight, following the plan can help you because it limits high-fat foods and refined carbohydrates. Everyone who has had a stroke should talk to their doctor about what a healthy weight is for them.  Tips to Control Blood Pressure Limit the sodium that you get from food and drink. Your doctor or registered dietitian can tell you the limit that is right for you. In general, foods with more than 300 milligrams (mg) sodium per serving may not fit into your meal plan. Do not salt food at the table. Use very little salt, if any, when you cook. Choose carefully when you eat away from home. Restaurant foods can be very high in sodium. Let the person taking your order know that you want low-salt or no-salt choices. Many restaurants have special menus or will prepare food with less salt. Eat plenty of fruits and vegetables that are high in potassium. Good fruit choices include bananas, apricots, oranges, cantaloupe, and apples. High-potassium vegetables include potatoes, sweet potatoes, spinach, zucchini, and tomatoes.  Have fat-free and low-fat dairy products. These will help you get the calcium and potassium that your body needs. If you drink alcohol, limit the amount. Women should drink no more than one drink per day. Men should not drink more than two drinks per day. One drink is 12 ounces (oz) of beer, 5 oz of wine, or 1 oz of liquor.  Tips to Control Blood Cholesterol Levels Eat very little saturated fat and trans fat. These types of fat  can raise the low-density lipoprotein, or LDL ("bad"), cholesterol in your blood. Saturated fat is found in foods from animals, such as fatty meats, whole milk, butter, cream, and other dairy foods made with whole milk. It is also in tropical oils (palm, palm kernel, and coconut). Trans fat is found in all foods made with hydrogenated oils. It may be in fried foods, crackers, chips, and foods made with shortening or stick margarine. Choose unsaturated fats (heart-healthy fats), such as soybean, canola, olive, or sunflower oil. Liquid or soft tub margarines are also fine. Keep total amount of fat that you eat to less than 25% to 35% of the calories that you get from food and drink. Limit the cholesterol that you get from food to 200 mg of cholesterol per day. Foods high in cholesterol include egg yolks, fatty meats, shrimp, and dairy foods. Get 20 to 30 grams (g) of fiber per day: High-fiber foods include fruits, vegetables, and whole grains. Aim for 2 cups of fruit, 3 cups of vegetables, and 3 oz of whole grains per day. Soluble fiber is especially good for you. You can get it from oatmeal, dried beans, and peas. As you add fiber to your eating plan, you should also drink more water or other fluids. This will help your body process the fiber without discomfort. Eat cold-water, fatty fish (such as salmon, tuna, mackerel, and sardines) twice a week. These fish provide omega-3 fats, which are heart-healthy. Be aware, however, that canned fish can be high in sodium. Choose fresh or frozen fish, or buy low-sodium  canned types. Add ground flaxseed or flaxseed oil to food, or eat walnuts. These plant foods are also high in omega-3 fats.  Foods Recommended Remember: Most foods should have less than 300 mg sodium per serving and have little or no saturated fat or trans fat.  Food Group Foods Recommended Grains  Breads and cereals, especially those made with whole grains such as oats, barley, rye, or whole  wheat Pasta, especially whole grain pastas Brown rice Low-fat, low-sodium crackers and pretzels Vegetables  Fresh, frozen, or canned vegetables without added fat or salt Highly colored vegetables, such as broccoli, greens, sweet po tatoes, and tomatoes are especially good for you Fruits Fresh, frozen, canned, or dried fruit Milk and Milk Products Fat-free (skim), low-fat (1%) milk Buttermilk Nonfat or l ow-fat yogurt Nonfat, low-sodium cott age cheese Fat-free and low-fat, low-sodium cheese Meat and Other Protein Foods Fish (especially fatty fish, such as salmon, fresh tuna, or mackerel) Lean cuts of beef and pork (loin, leg, round, extra lean hamburger) Low-sodium cold cuts made with lean meat or soy protein Skinless Engineer, building services and other wild game Unsalted nuts and nut butters Dried beans and peas Meat alternatives made with soy or textured vegetable protein Egg whites or egg substitute Fats and Oils Unsaturated oils (soybean, olive, canola, sunflower, safflower) Soft or liquid margarines and vegetable oil spreads Salad dressings (nonfat or made with unsaturated oil) Seeds Avocado Other Herbs and spices to add flavor to replace salt Unsalted, low-fat snack foods, such as unsalted pretzels or plain popcorn  Foods Not Recommended Remember: Most foods should have less than 300 mg sodium per serving and have little or no saturated fat or trans fat.  Food Group Foods Not Recommended Grains  Baked goods made with hydrogenated oil or saturated fat Grain foods that are high in sodium or added sugar Vegetables  Canned vegetables (unless they are low sodium or salt free) Pickles, other vegetables packed in brine, such as sauerkraut Fried or breaded vegetables Vegetables in cream or butter sauces Fruits  Fried fruits; fruit dishes with cream or butter Milk and Milk Products  Cheese (except for low-fat, low-sodium types) Processed cheese products Whole milk Dairy foods  made from whole milk or cream (such as ice cream and half-and-half) Meat and Other Protein Foods  Canned or smoked meat or fish Marbled or fatty meats (such as bacon, sausage, hot dogs, regular hamburger) Whole eggs and egg yolks Poultry with skin High-sodium lunch or deli meats (such as salami) Canned beans (except for low-sodium or salt-free) Fats and Oils  Solid cooking fats (shortening, butter, stick margarine) Tropical oils (palm, palm kernel, or coconut oil) Hydrogenated oil (found in many packaged and fried foods) Other  Salt, seasoning mixes made with salt Soy sauce, miso Canned or dried soups (except for low-fat, low-sodium types) Bouillon cubes Ketchup, barbe cue sauce, worscestershire sauce, salsa Sugary drinks (such as soft drinks or fruit drinks) Snack foods made with hydrogenated oil, shorten ing, or butter High-sodium snack foods (chips, pretzels, salted nuts) High-fat, high-sugar desserts High-fat gravies and sauces Premade foods (boxed pasta mixes, frozen dinners, and so on) if high in sodium or fat Alcohol  Women: Do not have more than 1 drink per day. Men: Do not have more than 2 drinks per day. 1 drink = 5 oz wine, 12 oz beer, or 1 oz liquor  Stroke Sample 1-Day Menu Breakfast 1/2 cup orange juice 1 cup nonfat milk 3/4 cup oatmeal 1/2 cup blueberries Lunch 2 slices whole-wheat bread  3 oz [place] breast, low sodium 2 slices tomato 1 lettuce leaf 2 teaspoon low-fat mayonnaise 1 teaspoon mustard 1 cup summer squash 1/2 cup unsweetened applesauce Afternoon Snack 1/2 cup canned apricots (in juice, not syrup) 1/2 cup low-fat, low-sodium cottage cheese Evening Meal Tuna noodle casserole: 3 oz tuna, 1 cup noodles, 1/8 cup nonfat milk, 1 teaspoon margarine 1/2 cup steamed spinach 1/2 cup cooked carrots 1 whole-wheat dinner roll 1 cup nonfat milk 1 teaspoon margarine  Stroke Vegan Sample 1-Day Menu Breakfast 1 cup oatmeal 1 cup blueberries  cup  orange juice 1 cup soymilk fortified with calcium, vitamin B12, and vitamin D Morning Snack 1 apple Lunch 2 slices whole wheat bread 2 slices meatless luncheon meat 1 lettuce leaf 2 slices tomato 2 teaspoons low-fat mayonnaise 1 teaspoon mustard 1 cup summer squash, raw  cup unsweetened applesauce Afternoon Snack 1 cup green pepper slices  cup hummus Evening Meal Casserole made with:  cup meatless chicken 1 cup pasta 2 tablespoons soymilk fortified with calcium, vitamin B12, and vitamin D 1 teaspoon margarine, soft, tub  cup cooked carrots  cup steamed spinach 1 whole wheat dinner roll 1 cup soymilk fortified with calcium, vitamin B12, and vitamin D  Stroke Vegetarian (Lacto-Ovo) Sample 1-Day Menu Breakfast 1 cup oatmeal  cup blueberries  cup orange juice 1 cup 1% milk Lunch 2 slices whole wheat bread 2 slices meatless luncheon meat 1 lettuce leaf 2 slices tomato 2 teaspoons low-fat mayonnaise 1 teaspoon mustard 1 cup summer squash, raw  cup unsweetened applesauce Afternoon Snack  cup canned apricots in juice  cup low-fat, low-sodium cottage cheese Evening Meal Casserole made with:  cup meatless chicken 1 cup pasta 2 tablespoons 1% milk 1 teaspoon margarine, soft, tub  cup cooked carrots  cup steamed spinach 1 whole wheat dinner roll 1 cup 1% milk  Copyright [employer] of [employer]. This handout may be duplicated for client education.

## 2022-08-06 NOTE — ED Notes (Signed)
Pt repetitively calling out asking for the physician, I have told the pt the PA is currently with another pt doing an assessment. Pt's son came out while PA was talking to another pt's family member and interrupted the conversation. PA stated that he will be coming to this pt's room after he is finished with assessment. I walked back into the pt's room and the pt was taking off cardiac leads and putting on his shirt. I told the pt that he should wait until the doctor discharges him to put his stuff back on. Pt walked passed me and went to the restroom.

## 2022-08-07 ENCOUNTER — Observation Stay (HOSPITAL_COMMUNITY): Payer: Medicare Other

## 2022-08-07 ENCOUNTER — Inpatient Hospital Stay (HOSPITAL_COMMUNITY): Payer: Medicare Other

## 2022-08-07 ENCOUNTER — Encounter (HOSPITAL_COMMUNITY): Payer: Self-pay | Admitting: Internal Medicine

## 2022-08-07 DIAGNOSIS — Z85828 Personal history of other malignant neoplasm of skin: Secondary | ICD-10-CM | POA: Diagnosis not present

## 2022-08-07 DIAGNOSIS — Z9989 Dependence on other enabling machines and devices: Secondary | ICD-10-CM

## 2022-08-07 DIAGNOSIS — I10 Essential (primary) hypertension: Secondary | ICD-10-CM | POA: Diagnosis present

## 2022-08-07 DIAGNOSIS — I63441 Cerebral infarction due to embolism of right cerebellar artery: Secondary | ICD-10-CM | POA: Diagnosis present

## 2022-08-07 DIAGNOSIS — Z87891 Personal history of nicotine dependence: Secondary | ICD-10-CM | POA: Diagnosis not present

## 2022-08-07 DIAGNOSIS — I97820 Postprocedural cerebrovascular infarction during cardiac surgery: Secondary | ICD-10-CM | POA: Diagnosis present

## 2022-08-07 DIAGNOSIS — G4733 Obstructive sleep apnea (adult) (pediatric): Secondary | ICD-10-CM | POA: Diagnosis present

## 2022-08-07 DIAGNOSIS — K219 Gastro-esophageal reflux disease without esophagitis: Secondary | ICD-10-CM

## 2022-08-07 DIAGNOSIS — K5900 Constipation, unspecified: Secondary | ICD-10-CM | POA: Diagnosis not present

## 2022-08-07 DIAGNOSIS — I639 Cerebral infarction, unspecified: Secondary | ICD-10-CM

## 2022-08-07 DIAGNOSIS — R29701 NIHSS score 1: Secondary | ICD-10-CM | POA: Diagnosis not present

## 2022-08-07 DIAGNOSIS — R29700 NIHSS score 0: Secondary | ICD-10-CM | POA: Diagnosis not present

## 2022-08-07 DIAGNOSIS — Z96642 Presence of left artificial hip joint: Secondary | ICD-10-CM | POA: Diagnosis present

## 2022-08-07 DIAGNOSIS — I614 Nontraumatic intracerebral hemorrhage in cerebellum: Secondary | ICD-10-CM | POA: Diagnosis present

## 2022-08-07 DIAGNOSIS — Z79899 Other long term (current) drug therapy: Secondary | ICD-10-CM | POA: Diagnosis not present

## 2022-08-07 DIAGNOSIS — J449 Chronic obstructive pulmonary disease, unspecified: Secondary | ICD-10-CM | POA: Diagnosis present

## 2022-08-07 DIAGNOSIS — I6389 Other cerebral infarction: Secondary | ICD-10-CM

## 2022-08-07 DIAGNOSIS — M47812 Spondylosis without myelopathy or radiculopathy, cervical region: Secondary | ICD-10-CM | POA: Diagnosis present

## 2022-08-07 DIAGNOSIS — E785 Hyperlipidemia, unspecified: Secondary | ICD-10-CM

## 2022-08-07 DIAGNOSIS — R29703 NIHSS score 3: Secondary | ICD-10-CM | POA: Diagnosis not present

## 2022-08-07 DIAGNOSIS — Z952 Presence of prosthetic heart valve: Secondary | ICD-10-CM

## 2022-08-07 DIAGNOSIS — H538 Other visual disturbances: Secondary | ICD-10-CM | POA: Diagnosis present

## 2022-08-07 DIAGNOSIS — I700 Atherosclerosis of aorta: Secondary | ICD-10-CM | POA: Diagnosis present

## 2022-08-07 DIAGNOSIS — Y838 Other surgical procedures as the cause of abnormal reaction of the patient, or of later complication, without mention of misadventure at the time of the procedure: Secondary | ICD-10-CM | POA: Diagnosis present

## 2022-08-07 DIAGNOSIS — N401 Enlarged prostate with lower urinary tract symptoms: Secondary | ICD-10-CM | POA: Diagnosis present

## 2022-08-07 DIAGNOSIS — R338 Other retention of urine: Secondary | ICD-10-CM | POA: Diagnosis present

## 2022-08-07 DIAGNOSIS — H5340 Unspecified visual field defects: Secondary | ICD-10-CM | POA: Diagnosis present

## 2022-08-07 DIAGNOSIS — Z953 Presence of xenogenic heart valve: Secondary | ICD-10-CM | POA: Diagnosis not present

## 2022-08-07 DIAGNOSIS — R233 Spontaneous ecchymoses: Secondary | ICD-10-CM | POA: Diagnosis present

## 2022-08-07 DIAGNOSIS — E876 Hypokalemia: Secondary | ICD-10-CM | POA: Diagnosis present

## 2022-08-07 DIAGNOSIS — I493 Ventricular premature depolarization: Secondary | ICD-10-CM | POA: Diagnosis present

## 2022-08-07 DIAGNOSIS — F32A Depression, unspecified: Secondary | ICD-10-CM | POA: Diagnosis present

## 2022-08-07 LAB — COMPREHENSIVE METABOLIC PANEL
ALT: 18 U/L (ref 0–44)
AST: 23 U/L (ref 15–41)
Albumin: 3.5 g/dL (ref 3.5–5.0)
Alkaline Phosphatase: 58 U/L (ref 38–126)
Anion gap: 9 (ref 5–15)
BUN: 11 mg/dL (ref 8–23)
CO2: 24 mmol/L (ref 22–32)
Calcium: 9.2 mg/dL (ref 8.9–10.3)
Chloride: 106 mmol/L (ref 98–111)
Creatinine, Ser: 0.69 mg/dL (ref 0.61–1.24)
GFR, Estimated: >60 mL/min (ref >60)
Glucose, Bld: 94 mg/dL (ref 70–99)
Potassium: 3.4 mmol/L — ABNORMAL LOW (ref 3.5–5.1)
Sodium: 139 mmol/L (ref 135–145)
Total Bilirubin: 0.8 mg/dL (ref 0.3–1.2)
Total Protein: 6.8 g/dL (ref 6.5–8.1)

## 2022-08-07 LAB — LIPID PANEL
Cholesterol: 129 mg/dL (ref 0–200)
HDL: 39 mg/dL — ABNORMAL LOW (ref >40)
LDL Cholesterol: 78 mg/dL (ref 0–99)
Total CHOL/HDL Ratio: 3.3 RATIO
Triglycerides: 61 mg/dL (ref <150)
VLDL: 12 mg/dL (ref 0–40)

## 2022-08-07 LAB — HEMOGLOBIN A1C
Hgb A1c MFr Bld: 5.2 % (ref 4.8–5.6)
Mean Plasma Glucose: 102.54 mg/dL

## 2022-08-07 LAB — CBC WITH DIFFERENTIAL/PLATELET
Abs Immature Granulocytes: 0.03 10*3/uL (ref 0.00–0.07)
Basophils Absolute: 0.1 10*3/uL (ref 0.0–0.1)
Basophils Relative: 1 %
Eosinophils Absolute: 0.2 10*3/uL (ref 0.0–0.5)
Eosinophils Relative: 2 %
HCT: 37.2 % — ABNORMAL LOW (ref 39.0–52.0)
Hemoglobin: 12.8 g/dL — ABNORMAL LOW (ref 13.0–17.0)
Immature Granulocytes: 0 %
Lymphocytes Relative: 16 %
Lymphs Abs: 1.3 10*3/uL (ref 0.7–4.0)
MCH: 30.5 pg (ref 26.0–34.0)
MCHC: 34.4 g/dL (ref 30.0–36.0)
MCV: 88.8 fL (ref 80.0–100.0)
Monocytes Absolute: 1 10*3/uL (ref 0.1–1.0)
Monocytes Relative: 11 %
Neutro Abs: 6 10*3/uL (ref 1.7–7.7)
Neutrophils Relative %: 70 %
Platelets: 142 10*3/uL — ABNORMAL LOW (ref 150–400)
RBC: 4.19 MIL/uL — ABNORMAL LOW (ref 4.22–5.81)
RDW: 13.8 % (ref 11.5–15.5)
WBC: 8.5 10*3/uL (ref 4.0–10.5)
nRBC: 0 % (ref 0.0–0.2)

## 2022-08-07 LAB — ECHOCARDIOGRAM COMPLETE
AR max vel: 2.23 cm2
AV Area VTI: 2.56 cm2
AV Area mean vel: 2.14 cm2
AV Mean grad: 8 mmHg
AV Peak grad: 12.8 mmHg
Ao pk vel: 1.79 m/s
Area-P 1/2: 1.62 cm2
Height: 64 in
S' Lateral: 3.4 cm
Weight: 2828.94 oz

## 2022-08-07 LAB — MAGNESIUM: Magnesium: 2 mg/dL (ref 1.7–2.4)

## 2022-08-07 MED ORDER — GADOBUTROL 1 MMOL/ML IV SOLN
8.0000 mL | Freq: Once | INTRAVENOUS | Status: AC | PRN
  Administered 2022-08-07: 8 mL via INTRAVENOUS

## 2022-08-07 MED ORDER — ASPIRIN 81 MG PO CHEW
81.0000 mg | CHEWABLE_TABLET | Freq: Every day | ORAL | Status: DC
  Administered 2022-08-07 – 2022-08-10 (×4): 81 mg via ORAL
  Filled 2022-08-07 (×4): qty 1

## 2022-08-07 MED ORDER — PANTOPRAZOLE SODIUM 40 MG PO TBEC
40.0000 mg | DELAYED_RELEASE_TABLET | Freq: Every day | ORAL | Status: DC
  Administered 2022-08-07 – 2022-08-10 (×4): 40 mg via ORAL
  Filled 2022-08-07 (×4): qty 1

## 2022-08-07 MED ORDER — ACETAMINOPHEN 650 MG RE SUPP: 650.0000 mg | Freq: Four times a day (QID) | RECTAL | Status: DC | PRN

## 2022-08-07 MED ORDER — HYDRALAZINE HCL 20 MG/ML IJ SOLN: 10.0000 mg | INTRAMUSCULAR | Status: DC | PRN

## 2022-08-07 MED ORDER — CLOPIDOGREL BISULFATE 75 MG PO TABS
75.0000 mg | ORAL_TABLET | Freq: Every day | ORAL | Status: DC
  Administered 2022-08-07 – 2022-08-10 (×4): 75 mg via ORAL
  Filled 2022-08-07 (×5): qty 1

## 2022-08-07 MED ORDER — ACETAMINOPHEN 325 MG PO TABS
650.0000 mg | ORAL_TABLET | Freq: Four times a day (QID) | ORAL | Status: DC | PRN
  Administered 2022-08-07 – 2022-08-10 (×4): 650 mg via ORAL
  Filled 2022-08-07 (×4): qty 2

## 2022-08-07 MED ORDER — PRAVASTATIN SODIUM 40 MG PO TABS
80.0000 mg | ORAL_TABLET | Freq: Every evening | ORAL | Status: DC
  Administered 2022-08-07: 80 mg via ORAL
  Filled 2022-08-07: qty 2

## 2022-08-07 MED ORDER — DOCUSATE SODIUM 100 MG PO CAPS
100.0000 mg | ORAL_CAPSULE | Freq: Two times a day (BID) | ORAL | Status: DC
  Administered 2022-08-07 – 2022-08-09 (×5): 100 mg via ORAL
  Filled 2022-08-07 (×6): qty 1

## 2022-08-07 MED ORDER — FAMOTIDINE 20 MG PO TABS
40.0000 mg | ORAL_TABLET | Freq: Every evening | ORAL | Status: DC
  Administered 2022-08-07 – 2022-08-09 (×3): 40 mg via ORAL
  Filled 2022-08-07 (×4): qty 2

## 2022-08-07 MED ORDER — ZOLPIDEM TARTRATE 5 MG PO TABS
5.0000 mg | ORAL_TABLET | Freq: Every evening | ORAL | Status: DC | PRN
  Administered 2022-08-07: 5 mg via ORAL
  Filled 2022-08-07: qty 1

## 2022-08-07 MED ORDER — MOMETASONE FURO-FORMOTEROL FUM 200-5 MCG/ACT IN AERO
2.0000 | INHALATION_SPRAY | Freq: Two times a day (BID) | RESPIRATORY_TRACT | Status: DC
  Administered 2022-08-08 – 2022-08-10 (×5): 2 via RESPIRATORY_TRACT
  Filled 2022-08-07: qty 8.8

## 2022-08-07 MED ORDER — STROKE: EARLY STAGES OF RECOVERY BOOK
Freq: Once | Status: AC
  Filled 2022-08-07: qty 1

## 2022-08-07 NOTE — ED Notes (Signed)
Pt ambulated to nurses station asking when will he go to his room. Advised pt at this time I do not know a time frame however I'm working on it. Pt ambulated back to his room.

## 2022-08-07 NOTE — ED Notes (Signed)
PT at bedside.

## 2022-08-07 NOTE — H&P (Signed)
History and Physical    PLEASE NOTE THAT DRAGON DICTATION SOFTWARE WAS USED IN THE CONSTRUCTION OF THIS NOTE.   Mark Zamora EGB:[id number] DOB: 02-27-1946 DOA: 08/06/2022  PCP: Maury Dus, MD  Patient coming from: home   I have personally briefly reviewed patient's old medical records in [place]  Chief Complaint: Change in vision in right eye  HPI: Mark Zamora is a 76 y.o. male with medical history significant for severe aortic stenosis status post TAVR on 08/01/2022, COPD, essential hypertension, hyperlipidemia, obstructive sleep apnea on nocturnal CPAP, who is admitted to [facility] on 08/06/2022 with acute ischemic stroke following a ED to ED transfer from [facility] ED to [facility] ED after presenting to the former facility complaining change in vision in right eye.   In the setting of severe aortic stenosis, the patient underwent TAVR at [facility] on 08/01/2022, and was discharged home on 08/03/2022.  The patient reports that he awoke on the morning of Friday, 08/04/2022, in his normal state of health, without any acute focal neurologic deficits or visual field changes at that time.  However, starting around 10 AM on 08/04/2022, as the patient reports acute onset change in his vision in the right eye, noting the appearance of dark-colored spots in all 4 quadrants of the right eye, while denying any visual changes in the left eye.  He notes that these changes in vision have persisted and are still present at this time.  No recent preceding trauma.  Denies any associated acute focal weakness, acute focal numbness, paresthesias, dysphagia, dizziness, vertigo, nausea, vomiting, word finding difficulties, slurred speech, facial droop, or headache.   He also denies any recent chest pain, shortness of breath, palpitations, diaphoresis, presyncope, or syncope.  No known history of previous stroke, and medical history notable for essential hypertension as well as  hyperlipidemia, for which she is on pravastatin 80 mg p.o. daily at home.  He also has a history of obstructive sleep apnea, reporting good corresponding compliance with home nocturnal CPAP.  No use of recreational drugs, and the patient confirms that he is a former smoker, having completely quit smoking the mid 1990s, without subsequent recurrence.  Denies any known history of diabetes or paroxysmal atrial fibrillation.  Notes that he is on a daily baby aspirin but otherwise no blood thinners as an outpatient.  He subsequently presented to [facility] emergency department for further evaluation of the aforementioned acute changes in vision in his right eye.   Will at [facility] ED, he underwent CT head that showed no evidence of acute process, including no evidence of intracranial hemorrhage.  He subsequently underwent ED to ED transfer from [facility] to [facility] to expedite pursuit of MRI brain to further evaluate the above.   Of note, the patient underwent most recent echocardiogram on  08/02/22, which was postop day 1 status post TAVR.  This echocardiogram is notable for LVEF 55 to 60%, moderate concentric LVH, right ventricular systolic function normal, mild to moderately dilated left atrium, mildly dilated right atrium.     Mark Zamora ED Course:  Vital signs in the ED were notable for the following: Afebrile; heart rate 37-10; systolic blood pressure in the 150s to 180s mmHg; respiratory rate 16-18; oxygen saturation 96 to 97% on room air.  Imaging and additional notable ED work-up: MRI brain showed acute infarcts in the bilateral cerebellar hemispheres, affecting the right PICA and bilateral superior cerebellar artery territory.  The inferior right cerebellar infarct is associated  with petechial hemorrhage.  Local mass effect in the posterior fossa with sulcal effacement and effacement of the fourth ventricle without evidence of resultant hydrocephalus.   Additionally, he underwent MRI of the bilateral  orbits, showing no evidence of acute process.  EKG shows sinus rhythm with single PVC, heart rate 64, normal intervals, and no evidence of T wave or ST changes, including no evidence of ST elevation.  [facility] EDP discussed patient's case and imaging with the on-call neurologist, Dr. Cheral Marker, Who recommended admission to the hospital service for further stroke work-up, giving that neurology will formally consult, with additional recommendations pending at this time.   Subsequently, the patient was admitted for observation for acute ischemic stroke.     Review of Systems: As per HPI otherwise 10 point review of systems negative.   Past Medical History:  Diagnosis Date   AAA (abdominal aortic aneurysm) ([place]) 01/29/2017   2.9cm   Allergic rhinitis    Anxiety    Arthritis    l ankle   BPH (benign prostatic hypertrophy)    Cancer (HCC)    HX SKIN CANCER   Cataract, bilateral    Chronic back pain    Chronic left hip pain    Constipation    COPD (chronic obstructive pulmonary disease) ([place]) 2020   Depression    Essential hypertension    GERD (gastroesophageal reflux disease)    History of chronic sinusitis    History of skin cancer    Hyperlipidemia    Insomnia    Neck pain, chronic    Nocturia    OSA on CPAP    uses cpap   Pedal edema    Chronic   Postherpetic neuralgia    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 Ali Lowe & Dr. Lavonna Monarch   Severe aortic stenosis    Shingles    Spinal stenosis     Past Surgical History:  Procedure Laterality Date   ABDOMINAL AORTOGRAM N/A 05/31/2022   Procedure: ABDOMINAL AORTOGRAM;  Surgeon: Early Osmond, MD;  Location: [place] [facility];  Service: Cardiovascular;  Laterality: N/A;   ANKLE ARTHROSCOPY Left 01/2011   CATARACT EXTRACTION, BILATERAL     COLONOSCOPY     INTRAOPERATIVE TRANSTHORACIC ECHOCARDIOGRAM N/A 08/01/2022   Procedure: INTRAOPERATIVE  TRANSTHORACIC ECHOCARDIOGRAM;  Surgeon: Early Osmond, MD;  Location: [place];  Service: Open Heart Surgery;  Laterality: N/A;   KNEE ARTHROSCOPY Left 04/19/2011   KNEE ARTHROSCOPY WITH MEDIAL MENISECTOMY Right 05/18/2015   Procedure: RIGHT KNEE ARTHROSCOPY WITH  MEDIAL MENISCAL DEBRIDEMENT;  Surgeon: Gaynelle Arabian, MD;  Location: [place];  Service: Orthopedics;  Laterality: Right;   pilonidal fistulectomy     RIGHT HEART CATH AND CORONARY ANGIOGRAPHY N/A 05/31/2022   Procedure: RIGHT HEART CATH AND CORONARY ANGIOGRAPHY;  Surgeon: Early Osmond, MD;  Location: [place] [facility];  Service: Cardiovascular;  Laterality: N/A;   TONSILLECTOMY     pt reports 1/2 still present   TOTAL HIP ARTHROPLASTY Left 12/12/2017   Procedure: LEFT TOTAL HIP ARTHROPLASTY ANTERIOR APPROACH;  Surgeon: Gaynelle Arabian, MD;  Location: [facility];  Service: Orthopedics;  Laterality: Left;   TRANSCATHETER AORTIC VALVE REPLACEMENT, TRANSFEMORAL Left 08/01/2022   Procedure: Transcatheter Aortic Valve Replacement, Transfemoral using Edwards 29 MM SAPIEN 3 Ultra;  Surgeon: Early Osmond, MD;  Location: [place];  Service: Open Heart Surgery;  Laterality: Left;  Transfemoral valve  insertion.   TRANSURETHRAL RESECTION OF PROSTATE     TRANSURETHRAL RESECTION OF PROSTATE N/A 06/25/2015   Procedure: TRANSURETHRAL RESECTION OF THE PROSTATE WITH GYRUS INSTRUMENTS BLADDER BIOPSY AND FULGURATION;  Surgeon: Cleon Gustin, MD;  Location: [facility];  Service: Urology;  Laterality: N/A;    Social History:  reports that he quit smoking about 28 years ago. His smoking use included cigarettes. He has never used smokeless tobacco. He reports current alcohol use of about 3.0 standard drinks of alcohol per week. He reports that he does not use drugs.   Allergies  Allergen Reactions   Compazine [Prochlorperazine Edisylate] Anaphylaxis and Other (See Comments)    Pt states "any nausea med ending in "zine"  Seizures    Cymbalta [Duloxetine Hcl] Other (See Comments)    Urinary retention    Promethazine Anaphylaxis and Other (See Comments)    seizures   Codeine Other (See Comments)    constipation   Other Other (See Comments)    All antiemetic meds ending -zine  Prefers to not take any narcotic pain meds    Family History  Problem Relation Age of Onset   Hypercholesterolemia Mother    Stroke Mother        diagnosed w/DM,CVD   CVA Father    Seizures Father     Family history reviewed and not pertinent    Prior to Admission medications   Medication Sig Start Date End Date Taking? Authorizing Provider  acetaminophen (TYLENOL) 650 MG CR tablet Take 1,300 mg by mouth every 8 (eight) hours as needed for pain.    [provider]  albuterol (VENTOLIN HFA) 108 (90 Base) MCG/ACT inhaler Inhale 1-2 puffs into the lungs every 6 (six) hours as needed for shortness of breath or wheezing. 04/10/22   [provider]  amLODipine (NORVASC) 5 MG tablet Take 5 mg by mouth daily.    [provider]  aspirin EC 81 MG tablet Take 1 tablet (81 mg total) by mouth daily. Swallow whole. 05/22/22   Early Osmond, MD  budesonide-formoterol (SYMBICORT) 160-4.5 MCG/ACT inhaler Inhale 2 puffs into the lungs 2 (two) times daily.    [provider]  Cholecalciferol (VITAMIN D3) 50 MCG (2000 UT) capsule Take 2,000 Units by mouth daily.    [provider]  Dextromethorphan-guaiFENesin (MUCINEX DM MAXIMUM STRENGTH) 60-1200 MG TB12 Take 1 tablet by mouth at bedtime.    [provider]  diclofenac (VOLTAREN) 75 MG EC tablet Take 75 mg by mouth 2 (two) times daily. 06/13/22   [provider]  doxazosin (CARDURA) 8 MG tablet Take 8 mg by mouth at bedtime.    [provider]  famotidine (PEPCID) 40 MG tablet Take 40 mg by mouth every evening.    [provider]  fluticasone (FLONASE) 50 MCG/ACT nasal spray Place 2 sprays into both nostrils at bedtime.     [provider]  lisinopril (PRINIVIL,ZESTRIL) 40 MG tablet Take 40 mg by mouth daily.    [provider]  Melatonin 10 MG TABS Take 20 mg by mouth at bedtime.    [provider]  Multiple Vitamin (MULTIVITAMIN) capsule Take 1 capsule by mouth daily.    [provider]  NON FORMULARY Pt uses a cpap nightly    [provider]  omeprazole (PRILOSEC) 20 MG capsule Take 20 mg by mouth every morning.     [provider]  potassium chloride SA (K-DUR,KLOR-CON) 20 MEQ tablet Take 20 mEq by mouth daily.  [provider]  pravastatin (PRAVACHOL) 80 MG tablet Take 80 mg by mouth every evening.     [provider]  Psyllium (METAMUCIL) 28.3 % POWD Take 1 Scoop by mouth daily.    [provider]  spironolactone (ALDACTONE) 25 MG tablet Take 6.25 mg by mouth daily.    [provider]  tiZANidine (ZANAFLEX) 4 MG capsule Take 1 capsule (4 mg total) by mouth 3 (three) times daily. 12/13/17   Perkins, Alexzandrew L, PA-C  traMADol (ULTRAM) 50 MG tablet Take 1-2 tablets (50-100 mg total) by mouth every 6 (six) hours as needed (mild pain). 12/13/17   Perkins, Alexzandrew L, PA-C  vitamin E 180 MG (400 UNITS) capsule Take 400 Units by mouth daily.    [provider]  zolpidem (AMBIEN CR) 12.5 MG CR tablet Take 12.5 mg by mouth at bedtime.    [provider]     Objective    Physical Exam: Vitals:   08/07/22 0200 08/07/22 0245 08/07/22 0322 08/07/22 0330  BP: (!) 182/93 (!) 188/82 (!) 188/88 (!) 188/85  Pulse: 63 (!) 59 (!) 59 60  Resp: '16  14 16  '$ Temp:   98.6 F (37 C)   TempSrc:   Oral   SpO2: 97% 97% 96% 97%  Weight:      Height:        General: appears to be stated age; alert, oriented Skin: warm, dry, no rash Head:  AT/[place] Mouth:  Oral mucosa membranes appear moist, normal dentition Neck: supple; trachea midline Heart:  RRR; did not appreciate any M/R/G Lungs: CTAB, did not appreciate  any wheezes, rales, or rhonchi Abdomen: + BS; soft, ND, NT Vascular: 2+ pedal pulses b/l; 2+ radial pulses b/l Extremities: no peripheral edema, no muscle wasting Neuro: 5/5 strength of the proximal and distal flexors and extensors of the upper and lower extremities bilaterally; sensation intact in upper and lower extremities b/l; visual field defects in right eye x 4 quads; unaffected left eye. no pronator drift; no evidence suggestive of slurred speech, dysarthria, or facial droop; Normal muscle tone. No tremors.    Labs on Admission: I have personally reviewed following labs and imaging studies  CBC: Recent Labs  Lab 08/01/22 1154 08/01/22 1221 08/01/22 1309 08/02/22 0748 08/03/22 0217  WBC  --   --   --  15.7* 12.4*  HGB 13.3 12.6* 12.9* 13.1 11.2*  HCT 39.0 37.0* 38.0* 38.1* 33.8*  MCV  --   --   --  87.8 89.4  PLT  --   --   --  148* 268*   Basic Metabolic Panel: Recent Labs  Lab 08/01/22 1154 08/01/22 1221 08/01/22 1309 08/02/22 0748 08/03/22 0217  NA 143 143 143 137 137  K 3.5 3.5 3.4* 3.2* 3.4*  CL 103 102 104 104 106  CO2  --   --   --  24 25  GLUCOSE 121* 119* 109* 123* 99  BUN '10 11 10 16 19  '$ CREATININE 0.60* 0.60* 0.60* 0.80 0.84  CALCIUM  --   --   --  8.9 8.9   GFR: Estimated Creatinine Clearance: 71.5 mL/min (by C-G formula based on SCr of 0.84 mg/dL). Liver Function Tests: No results for input(s): "AST", "ALT", "ALKPHOS", "BILITOT", "PROT", "ALBUMIN" in the last 168 hours. No results for input(s): "LIPASE", "AMYLASE" in the last 168 hours. No results for input(s): "AMMONIA" in the last 168 hours. Coagulation Profile: No results for input(s): "INR", "PROTIME" in the last 168  hours. Cardiac Enzymes: No results for input(s): "CKTOTAL", "CKMB", "CKMBINDEX", "TROPONINI" in the last 168 hours. BNP (last 3 results) No results for input(s): "PROBNP" in the last 8760 hours. HbA1C: No results for input(s): "HGBA1C" in the last 72 hours. CBG: No results  for input(s): "GLUCAP" in the last 168 hours. Lipid Profile: No results for input(s): "CHOL", "HDL", "LDLCALC", "TRIG", "CHOLHDL", "LDLDIRECT" in the last 72 hours. Thyroid Function Tests: No results for input(s): "TSH", "T4TOTAL", "FREET4", "T3FREE", "THYROIDAB" in the last 72 hours. Anemia Panel: No results for input(s): "VITAMINB12", "FOLATE", "FERRITIN", "TIBC", "IRON", "RETICCTPCT" in the last 72 hours. Urine analysis:    Component Value Date/Time   COLORURINE YELLOW 07/28/2022 1015   APPEARANCEUR CLEAR 07/28/2022 1015   LABSPEC 1.021 07/28/2022 1015   PHURINE 6.0 07/28/2022 1015   GLUCOSEU NEGATIVE 07/28/2022 1015   HGBUR NEGATIVE 07/28/2022 1015   BILIRUBINUR NEGATIVE 07/28/2022 1015   KETONESUR NEGATIVE 07/28/2022 1015   PROTEINUR NEGATIVE 07/28/2022 1015   UROBILINOGEN 1.0 06/26/2015 2051   NITRITE NEGATIVE 07/28/2022 1015   LEUKOCYTESUR NEGATIVE 07/28/2022 1015    Radiological Exams on Admission: MR BRAIN W WO CONTRAST  Result Date: 08/07/2022 CLINICAL DATA:  Right eye vision changes following TAVR EXAM: MRI HEAD AND ORBITS WITHOUT AND WITH CONTRAST TECHNIQUE: Multiplanar, multiecho pulse sequences of the brain and surrounding structures were obtained without and with intravenous contrast. Multiplanar, multiecho pulse sequences of the orbits and surrounding structures were obtained including fat saturation techniques, before and after intravenous contrast administration. CONTRAST:  62m GADAVIST GADOBUTROL 1 MMOL/ML IV SOLN COMPARISON:  No prior MRI, correlation is made with CT head 08/06/2022 FINDINGS: MRI HEAD FINDINGS Brain: Restricted diffusion with ADC correlate in the bilateral cerebellar hemispheres (series 5, 54-64), primarily affecting the right PICA and bilateral superior cerebellar artery territory, consistent with acute infarct. These areas demonstrate T2 hyperintense signal, consistent with edema, with gyral swelling and sulcal effacement. This causes local mass  effect on the fourth ventricle, which is effaced. No definite hydrocephalus. Susceptibility in the inferior right cerebellum (series 14, images 9-15), likely petechial hemorrhage. Some contrast enhancement is associated with the areas of infarction. Approximately 5 mm of right-to-left midline shift. No extra-axial collection. Confluent T2 hyperintense signal in the periventricular white matter and pons, likely the sequela of severe chronic small vessel ischemic disease. Remote left lateral cerebellar infarct. Dilated perivascular spaces in the basal ganglia and hippocampus. Vascular: Normal arterial flow voids. Skull and upper cervical spine: Normal marrow signal. Other: The mastoids are well aerated. MRI ORBITS FINDINGS Orbits: No traumatic or inflammatory finding. Globes, optic nerves, orbital fat, extraocular muscles, vascular structures, and lacrimal glands are normal. No abnormal enhancement. Status post bilateral lens replacements. Visualized sinuses: Clear. Soft tissues: Negative. IMPRESSION: 1. 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 The inferior right cerebellar infarct is associated with petechial hemorrhage. 2. Local mass effect in the posterior fossa with sulcal effacement and effacement of the fourth ventricle, without evidence of resulting hydrocephalus. 3. No acute finding in the orbits. These results were called by telephone at the time of interpretation on 08/07/2022 at 1:34 am to provider MESSER, who verbally acknowledged these results. Electronically Signed   By: AMerilyn BabaM.D.   On: 08/07/2022 01:35   MR ORBITS W WO CONTRAST  Result Date: 08/07/2022 CLINICAL DATA:  Right eye vision changes following TAVR EXAM: MRI HEAD AND ORBITS WITHOUT AND WITH CONTRAST TECHNIQUE: Multiplanar, multiecho pulse sequences of the brain  and surrounding structures were obtained without and with intravenous contrast.  Multiplanar, multiecho pulse sequences of the orbits and surrounding structures were obtained including fat saturation techniques, before and after intravenous contrast administration. CONTRAST:  26m GADAVIST GADOBUTROL 1 MMOL/ML IV SOLN COMPARISON:  No prior MRI, correlation is made with CT head 08/06/2022 FINDINGS: MRI HEAD FINDINGS Brain: Restricted diffusion with ADC correlate in the bilateral cerebellar hemispheres (series 5, 54-64), primarily affecting the right PICA and bilateral superior cerebellar artery territory, consistent with acute infarct. These areas demonstrate T2 hyperintense signal, consistent with edema, with gyral swelling and sulcal effacement. This causes local mass effect on the fourth ventricle, which is effaced. No definite hydrocephalus. Susceptibility in the inferior right cerebellum (series 14, images 9-15), likely petechial hemorrhage. Some contrast enhancement is associated with the areas of infarction. Approximately 5 mm of right-to-left midline shift. No extra-axial collection. Confluent T2 hyperintense signal in the periventricular white matter and pons, likely the sequela of severe chronic small vessel ischemic disease. Remote left lateral cerebellar infarct. Dilated perivascular spaces in the basal ganglia and hippocampus. Vascular: Normal arterial flow voids. Skull and upper cervical spine: Normal marrow signal. Other: The mastoids are well aerated. MRI ORBITS FINDINGS Orbits: No traumatic or inflammatory finding. Globes, optic nerves, orbital fat, extraocular muscles, vascular structures, and lacrimal glands are normal. No abnormal enhancement. Status post bilateral lens replacements. Visualized sinuses: Clear. Soft tissues: Negative. IMPRESSION: 1. 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 The inferior right cerebellar infarct is associated with petechial hemorrhage. 2. Local mass effect  in the posterior fossa with sulcal effacement and effacement of the fourth ventricle, without evidence of resulting hydrocephalus. 3. No acute finding in the orbits. These results were called by telephone at the time of interpretation on 08/07/2022 at 1:34 am to provider MESSER, who verbally acknowledged these results. Electronically Signed   By: AMerilyn BabaM.D.   On: 08/07/2022 01:35     EKG: Independently reviewed, with result as described above.    Assessment/Plan   Principal Problem:   Acute ischemic stroke (HCC) Active Problems:   OSA on CPAP   GERD (gastroesophageal reflux disease)   Essential hypertension   COPD (chronic obstructive pulmonary disease) (HCC)   Hyperlipidemia      #) Acute ischemic stroke: In the setting of acute onset of visual field defects involving all 4 quadrants of the right eye starting at 10 AM on Friday, 08/04/2022 (representing less than normal) in the context of persistence of the symptoms, in the absence of any objective additional acute focal neurologic deficits, with presenting MRI brain showing evidence of acute infarcts in the bilateral cerebellar hemispheres affecting the right PICA and bilateral superior cerebellar artery territory, as further detailed above.  [facility] of acute [facility] process.  Consequently, the patient is now greater than 48 hours from last known normal and is therefore not a candidate for tPA and he is also outside of the window for consideration for thrombectomy.   EDP discussed patient's case and imaging with the on-call neurologist, Dr. LCheral Markerwho recommended further stroke work-up, with commands neurology will formally consult, with additional recommendations pending at this time, including recommendations regarding CTA of the head and neck as well as recommendations regarding antiplatelet intervention.  Pursuit of aforementioned CTA head of the neck is nonurgently time sensitive as the  patient is outside of the window for consideration of thrombectomy, as above.  We will also follow for neurology's recommendations regarding permissive hypertension, although he will be at the 72-hour mark relative to last known normal around 10 AM this morning. (9/11).   The patient possesses multiple modifiable ischemic CVA risk factors, include history hypertension, hyperlipidemia, obstructive sleep apnea compliant on home CPAP and is also a former smoker.  No known history of diabetes or paroxysmal atrial fibrillation, with presenting EKG showing evidence of sinus rhythm with single PVC and no overt evidence of acute ischemic changes.    Plan: Nursing bedside swallow evaluation x 1 now, and will not initiate oral medications or diet until the patient has passed this. Head of the bed at 30 degrees. Neuro checks per protocol. VS per protocol. Will allow for permissive hypertension for now, with likely discontinuation of this by 10 AM this morning, or sooner pending formal neurology recommendations via their formal consultation.  In the meantime I have ordered as needed IV hydralazine for systolic blood pressures greater than 850 mmHg or diastolic blood pressure greater than 120 mmHg. Monitor on telemetry, including monitoring for atrial fibrillation as modifiable risk factor for acute ischemic CVA.  We will follow for neurology's recommendations regarding CTA head and neck, as above.  As the patient just had echocardiogram performed on 08/02/2022, we will refrain from ordering an additional echocardiogram at this time.  Additionally, as component of evaluation of potential modifiable ischemic CVA risk factors, will also check lipid panel and A1c. PT/OT consults have been ordered to occur in the morning.  For now, we will continue him pravastatin.  will follow for neurology's formal recommendations regarding antiplatelet approach.          #) COPD: Documented history of such in the context of a former  smoking history, noting that the patient completely quit smoking in the mid 1990s.  No clinical evidence to suggest acute exacerbation thereof.  The patient respiratory regimen includes scheduled Symbicort as well as prn albuterol inhaler.  Plan: Continue home respiratory regimen.             #) Essential Hypertension: documented h/o such, with outpatient antihypertensive regimen including amlodipine, doxazosin, lisinopril, spironolactone.  In setting of presenting acute ischemic stroke, currently observing permissive hypertension, with anticipation of this sending by 10 AM on 08/07/2022, or sooner pending neurology's formal recommendations, as further detailed above.   Plan: Close monitoring of subsequent BP via routine VS. for now, holding home antihypertensive medications. Will follow for neurologist formal recommendations regarding permissive hypertension, as above.           #) Hyperlipidemia: documented h/o such. On pravastatin as outpatient.   Plan: continue home statin for now.  We will check lipid panel as a component of evaluation for potentially modifiable ischemic CVA risk factors in the setting of presenting acute ischemic stroke, as above.          #) GERD: documented h/o such; on omeprazole as well as famotidine as outpatient.  For now, we will continue him H2 blocker as well as PPI.  However, if neurology subsequently recommends a course of Plavix as component of management of presenting acute ischemic CVA, consideration will be given to holding omeprazole due to its potential inhibitory effects on Plavix.   Plan: continue home PPI and famotidine for now, while closely monitoring for ensuing neurology recommendations regarding antiplatelet approach.           #) Obstructive sleep apnea: Documented history of such, with the patient reporting good compliance on home nocturnal  CPAP.  Plan: I have placed order for nocturnal CPAP at home settings during  this hospitalization.       DVT prophylaxis: SCD's   Code Status: Full code Family Communication: none Disposition Plan: Per Rounding Team Consults called: EDP is discussed patient's case with the on-call neurologist, Dr. Cheral Marker, Who will formally consult, as further detailed above;  Admission status: Observation    PLEASE NOTE THAT DRAGON DICTATION SOFTWARE WAS USED IN THE CONSTRUCTION OF THIS NOTE.   [place] DO [facility]  From [place]   08/07/2022, 5:46 AM

## 2022-08-07 NOTE — ED Notes (Addendum)
Pt returned from echo; OT at bedside

## 2022-08-07 NOTE — ED Notes (Signed)
The pt just returned from mri he wants to be awakened at [facility]

## 2022-08-07 NOTE — ED Provider Notes (Signed)
Patient sent from [facility] for MRI.  MRI shows stroke.    I consulted with Dr. Cheral Marker who will consult.  Appreciate Dr. Velia Meyer for admitting for stroke workup.   Montine Circle, PA-C 08/07/22 0454    Merrily Pew, MD 08/07/22 [phone]

## 2022-08-07 NOTE — Progress Notes (Signed)
  Echocardiogram 2D Echocardiogram has been performed.  Mark Zamora 08/07/2022, 11:33 AM

## 2022-08-07 NOTE — Progress Notes (Addendum)
STROKE TEAM PROGRESS NOTE   INTERVAL HISTORY No family at the bedside. Patient is awake and alert in NAD. He is oriented x 4, no aphasia or dysarthria. Still with vision problems in left eye. Strength equal bilaterally, no ataxia, sensation symmetric.   Repeat CT head with Evolving recent bilateral cerebellar infarcts. No acute hemorrhage or worsening mass effect.  CTA head and neck with Mild atherosclerosis in the head and neck without large vessel occlusion or high-grade proximal stenosis;  Mild extrinsic narrowing of both V2 segments due to cervical spondylosis;  Aortic Atherosclerosis.    Echo repeated today EF 55-60% with LV Grade 1 diastolic dysfunction  Vitals:   08/07/22 0815 08/07/22 0953 08/07/22 1000 08/07/22 1300  BP: (!) 156/99  (!) 145/86 (!) 162/93  Pulse: 91  70 66  Resp: '17  19 18  '$ Temp:  98.3 F (36.8 C)    TempSrc:  Oral    SpO2: 96%  98% 98%  Weight:      Height:       CBC:  Recent Labs  Lab 08/03/22 0217 08/07/22 0723  WBC 12.4* 8.5  NEUTROABS  --  6.0  HGB 11.2* 12.8*  HCT 33.8* 37.2*  MCV 89.4 88.8  PLT 118* 426*   Basic Metabolic Panel:  Recent Labs  Lab 08/03/22 0217 08/07/22 0723  NA 137 139  K 3.4* 3.4*  CL 106 106  CO2 25 24  GLUCOSE 99 94  BUN 19 11  CREATININE 0.84 0.69  CALCIUM 8.9 9.2  MG  --  2.0   Lipid Panel:  Recent Labs  Lab 08/07/22 0723  CHOL 129  TRIG 61  HDL 39*  CHOLHDL 3.3  VLDL 12  LDLCALC 78   HgbA1c:  Recent Labs  Lab 08/07/22 0723  HGBA1C 5.2   Urine Drug Screen: No results for input(s): "LABOPIA", "COCAINSCRNUR", "LABBENZ", "AMPHETMU", "THCU", "LABBARB" in the last 168 hours.  Alcohol Level No results for input(s): "ETH" in the last 168 hours.  IMAGING past 24 hours ECHOCARDIOGRAM COMPLETE  Result Date: 08/07/2022    ECHOCARDIOGRAM REPORT   Patient Name:   Mark Zamora Date of Exam: 08/07/2022 Medical Rec #:  [id number]          Height:       64.0 in Accession #:    [id number]         Weight:        176.8 lb Date of Birth:  [birth year]         BSA:          1.857 m Patient Age:    76 years           BP:           145/86 mmHg Patient Gender: M                  HR:           66 bpm. Exam Location:  Inpatient Procedure: 2D Echo, Cardiac Doppler and Color Doppler Indications:    Stroke  History:        Patient has prior history of Echocardiogram examinations, most                 recent 08/02/2022. AAA, TAVR (08/01/22), COPD, Aortic Valve                 Disease; Risk Factors:Sleep Apnea, Former Smoker, Hypertension,  Dyslipidemia and Alcohol.  Sonographer:    Mark Zamora Referring Phys: August Albino  Sonographer Comments: Technically difficult study due to poor echo windows. Image acquisition challenging due to patient body habitus and Image acquisition challenging due to respiratory motion. IMPRESSIONS  1. Left ventricular ejection fraction, by estimation, is 55 to 60%. The left ventricle has normal function. The left ventricle has no regional wall motion abnormalities. Left ventricular diastolic parameters are consistent with Grade I diastolic dysfunction (impaired relaxation).  2. Right ventricular systolic function is normal. The right ventricular size is normal. Tricuspid regurgitation signal is inadequate for assessing PA pressure.  3. The mitral valve is normal in structure. No evidence of mitral valve regurgitation. No evidence of mitral stenosis.  4. 29 mm Sapien Valve without no PVL or evidence of prosthetic stenosis.. The aortic valve has been repaired/replaced. Aortic valve regurgitation is not visualized. No aortic stenosis is present.  5. The inferior vena cava is normal in size with greater than 50% respiratory variability, suggesting right atrial pressure of 3 mmHg. Comparison(s): No significant change from prior study. FINDINGS  Left Ventricle: Left ventricular ejection fraction, by estimation, is 55 to 60%. The left ventricle has normal function. The left ventricle has no  regional wall motion abnormalities. The left ventricular internal cavity size was normal in size. Suboptimal image quality limits for assessment of left ventricular hypertrophy. Left ventricular diastolic parameters are consistent with Grade I diastolic dysfunction (impaired relaxation). Right Ventricle: The right ventricular size is normal. No increase in right ventricular wall thickness. Right ventricular systolic function is normal. Tricuspid regurgitation signal is inadequate for assessing PA pressure. Left Atrium: Left atrial size was normal in size. Right Atrium: Right atrial size was normal in size. Pericardium: There is no evidence of pericardial effusion. Mitral Valve: The mitral valve is normal in structure. No evidence of mitral valve regurgitation. No evidence of mitral valve stenosis. Tricuspid Valve: The tricuspid valve is normal in structure. Tricuspid valve regurgitation is not demonstrated. No evidence of tricuspid stenosis. Aortic Valve: 29 mm Sapien Valve without no PVL or evidence of prosthetic stenosis. The aortic valve has been repaired/replaced. Aortic valve regurgitation is not visualized. No aortic stenosis is present. Aortic valve mean gradient measures 8.0 mmHg. Aortic valve peak gradient measures 12.8 mmHg. Aortic valve area, by VTI measures 2.56 cm. Pulmonic Valve: The pulmonic valve was normal in structure. Pulmonic valve regurgitation is not visualized. No evidence of pulmonic stenosis. Aorta: The aortic root is normal in size and structure. Venous: The inferior vena cava is normal in size with greater than 50% respiratory variability, suggesting right atrial pressure of 3 mmHg. IAS/Shunts: No atrial level shunt detected by color flow Doppler.  LEFT VENTRICLE PLAX 2D LVIDd:         4.80 cm   Diastology LVIDs:         3.40 cm   LV e' medial:    6.40 cm/s LV PW:         1.00 cm   LV E/e' medial:  9.2 LV IVS:        1.00 cm   LV e' lateral:   5.96 cm/s LVOT diam:     2.20 cm   LV E/e'  lateral: 9.8 LV SV:         81 LV SV Index:   44 LVOT Area:     3.80 cm  RIGHT VENTRICLE             IVC RV S prime:  16.50 cm/s  IVC diam: 1.30 cm TAPSE (M-mode): 2.0 cm LEFT ATRIUM             Index        RIGHT ATRIUM           Index LA diam:        3.20 cm 1.72 cm/m   RA Area:     12.10 cm LA Vol (A2C):   49.3 ml 26.55 ml/m  RA Volume:   20.30 ml  10.93 ml/m LA Vol (A4C):   54.3 ml 29.25 ml/m LA Biplane Vol: 56.3 ml 30.33 ml/m  AORTIC VALVE AV Area (Vmax):    2.23 cm AV Area (Vmean):   2.14 cm AV Area (VTI):     2.56 cm AV Vmax:           179.00 cm/s AV Vmean:          136.000 cm/s AV VTI:            0.318 m AV Peak Grad:      12.8 mmHg AV Mean Grad:      8.0 mmHg LVOT Vmax:         105.00 cm/s LVOT Vmean:        76.400 cm/s LVOT VTI:          0.214 m LVOT/AV VTI ratio: 0.67  AORTA Ao Root diam: 2.90 cm MITRAL VALVE MV Area (PHT): 1.62 cm     SHUNTS MV Decel Time: 467 msec     Systemic VTI:  0.21 m MV E velocity: 58.60 cm/s   Systemic Diam: 2.20 cm MV A velocity: 113.00 cm/s MV E/A ratio:  0.52 Rudean Haskell MD Electronically signed by Rudean Haskell MD Signature Date/Time: 08/07/2022/12:33:34 PM    Final    CT HEAD WO CONTRAST (5MM)  Result Date: 08/07/2022 CLINICAL DATA:  Stroke follow-up EXAM: CT HEAD WITHOUT CONTRAST TECHNIQUE: Contiguous axial images were obtained from the base of the skull through the vertex without intravenous contrast. RADIATION DOSE REDUCTION: This exam was performed according to the departmental dose-optimization program which includes automated exposure control, adjustment of the mA and/or kV according to patient size and/or use of iterative reconstruction technique. COMPARISON:  Recent CT and MR imaging FINDINGS: Brain: Acute and chronic bilateral cerebellar infarcts are again identified. No obstructive hydrocephalus. No acute intracranial hemorrhage. Stable findings of parenchymal volume loss and probable chronic microvascular ischemic changes. No  extra-axial collection. Vascular: There is atherosclerotic calcification at the skull base. Skull: Calvarium is unremarkable. Sinuses/Orbits: No acute finding. Other: Mastoid air cells are clear. IMPRESSION: Evolving recent bilateral cerebellar infarcts. No acute hemorrhage or worsening mass effect. Electronically Signed   By: Macy Mis M.D.   On: 08/07/2022 09:12   MR BRAIN W WO CONTRAST  Result Date: 08/07/2022 CLINICAL DATA:  Right eye vision changes following TAVR EXAM: MRI HEAD AND ORBITS WITHOUT AND WITH CONTRAST TECHNIQUE: Multiplanar, multiecho pulse sequences of the brain and surrounding structures were obtained without and with intravenous contrast. Multiplanar, multiecho pulse sequences of the orbits and surrounding structures were obtained including fat saturation techniques, before and after intravenous contrast administration. CONTRAST:  10m GADAVIST GADOBUTROL 1 MMOL/ML IV SOLN COMPARISON:  No prior MRI, correlation is made with CT head 08/06/2022 FINDINGS: MRI HEAD FINDINGS Brain: Restricted diffusion with ADC correlate in the bilateral cerebellar hemispheres (series 5, 54-64), primarily affecting the right PICA and bilateral superior cerebellar artery territory, consistent with acute infarct. These areas demonstrate T2 hyperintense signal, consistent with  edema, with gyral swelling and sulcal effacement. This causes local mass effect on the fourth ventricle, which is effaced. No definite hydrocephalus. Susceptibility in the inferior right cerebellum (series 14, images 9-15), likely petechial hemorrhage. Some contrast enhancement is associated with the areas of infarction. Approximately 5 mm of right-to-left midline shift. No extra-axial collection. Confluent T2 hyperintense signal in the periventricular white matter and pons, likely the sequela of severe chronic small vessel ischemic disease. Remote left lateral cerebellar infarct. Dilated perivascular spaces in the basal ganglia and  hippocampus. Vascular: Normal arterial flow voids. Skull and upper cervical spine: Normal marrow signal. Other: The mastoids are well aerated. MRI ORBITS FINDINGS Orbits: No traumatic or inflammatory finding. Globes, optic nerves, orbital fat, extraocular muscles, vascular structures, and lacrimal glands are normal. No abnormal enhancement. Status post bilateral lens replacements. Visualized sinuses: Clear. Soft tissues: Negative. IMPRESSION: 1. 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 The inferior right cerebellar infarct is associated with petechial hemorrhage. 2. Local mass effect in the posterior fossa with sulcal effacement and effacement of the fourth ventricle, without evidence of resulting hydrocephalus. 3. No acute finding in the orbits. These results were called by telephone at the time of interpretation on 08/07/2022 at 1:34 am to provider MESSER, who verbally acknowledged these results. Electronically Signed   By: Merilyn Baba M.D.   On: 08/07/2022 01:35   MR ORBITS W WO CONTRAST  Result Date: 08/07/2022 CLINICAL DATA:  Right eye vision changes following TAVR EXAM: MRI HEAD AND ORBITS WITHOUT AND WITH CONTRAST TECHNIQUE: Multiplanar, multiecho pulse sequences of the brain and surrounding structures were obtained without and with intravenous contrast. Multiplanar, multiecho pulse sequences of the orbits and surrounding structures were obtained including fat saturation techniques, before and after intravenous contrast administration. CONTRAST:  8m GADAVIST GADOBUTROL 1 MMOL/ML IV SOLN COMPARISON:  No prior MRI, correlation is made with CT head 08/06/2022 FINDINGS: MRI HEAD FINDINGS Brain: Restricted diffusion with ADC correlate in the bilateral cerebellar hemispheres (series 5, 54-64), primarily affecting the right PICA and bilateral superior cerebellar artery territory, consistent with acute infarct. These  areas demonstrate T2 hyperintense signal, consistent with edema, with gyral swelling and sulcal effacement. This causes local mass effect on the fourth ventricle, which is effaced. No definite hydrocephalus. Susceptibility in the inferior right cerebellum (series 14, images 9-15), likely petechial hemorrhage. Some contrast enhancement is associated with the areas of infarction. Approximately 5 mm of right-to-left midline shift. No extra-axial collection. Confluent T2 hyperintense signal in the periventricular white matter and pons, likely the sequela of severe chronic small vessel ischemic disease. Remote left lateral cerebellar infarct. Dilated perivascular spaces in the basal ganglia and hippocampus. Vascular: Normal arterial flow voids. Skull and upper cervical spine: Normal marrow signal. Other: The mastoids are well aerated. MRI ORBITS FINDINGS Orbits: No traumatic or inflammatory finding. Globes, optic nerves, orbital fat, extraocular muscles, vascular structures, and lacrimal glands are normal. No abnormal enhancement. Status post bilateral lens replacements. Visualized sinuses: Clear. Soft tissues: Negative. IMPRESSION: 1. 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 The inferior right cerebellar infarct is associated with petechial hemorrhage. 2. Local mass effect in the posterior fossa with sulcal effacement and effacement of the fourth ventricle, without evidence of resulting hydrocephalus. 3. No acute finding in the orbits. These results were called by telephone at the time of interpretation on 08/07/2022 at 1:34 am to provider MESSER, who verbally  acknowledged these results. Electronically Signed   By: Merilyn Baba M.D.   On: 08/07/2022 01:35    PHYSICAL EXAM  Temp:  [98.1 F (36.7 C)-98.6 F (37 C)] 98.3 F (36.8 C) (09/11 0953) Pulse Rate:  [59-91] 66 (09/11 1300) Resp:  [10-19] 18 (09/11 1300) BP:  (145-188)/(82-99) 162/93 (09/11 1300) SpO2:  [93 %-99 %] 98 % (09/11 1300) Weight:  [80.2 kg] 80.2 kg (09/10 1834)  General - Well nourished, well developed, in no apparent distress. Cardiovascular - Regular rhythm and rate.  Mental Status -  Level of arousal and orientation to time, place, and person were intact. Language including expression, naming, repetition, comprehension was assessed and found intact. Attention span and concentration were normal. Recent and remote memory were intact. Fund of Knowledge was assessed and was intact.  Cranial Nerves II - XII - II - Visual field intact OU. III, IV, VI - Extraocular movements intact. V - Facial sensation intact bilaterally. VII - Facial movement intact bilaterally. VIII - Hearing & vestibular intact bilaterally. X - Palate elevates symmetrically. XI - Chin turning & shoulder shrug intact bilaterally. XII - Tongue protrusion intact.  Motor Strength - The patient's strength was normal in all extremities and pronator drift was absent.  Bulk was normal and fasciculations were absent.   Motor Tone - Muscle tone was assessed at the neck and appendages and was normal.  Sensory - Light touch, temperature/pinprick were assessed and were symmetrical.    Coordination - mild ataxia on the right   Gait and Station - deferred.   ASSESSMENT/PLAN Mr. DAMANY EASTMAN is a 76 y.o. 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 No acute hemorrhage or worsening mass effect.   CTA head & neck pending  MRI   1. 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 The inferior right cerebellar infarct is associated with petechial hemorrhage. 2. Local mass effect in the posterior fossa with sulcal effacement and effacement of the fourth ventricle, without evidence of resulting hydrocephalus. 3. No acute finding in the orbits.  2D Echo EF 83-66%, Grade 1 diastolic dysfunction  LDL No results found for requested labs within last 1095 days. HgbA1c 5.2 VTE prophylaxis - SCD's    Diet   Diet Heart Room service appropriate? Yes; Fluid consistency: Thin   aspirin 81 mg daily prior to admission, now on aspirin 81 mg daily.  Therapy recommendations:  pending Disposition:  pending  Hypertension Home meds:  norvasc, lisinopril Stable Permissive hypertension (OK if < 220/120) but gradually normalize in 5-7 days Long-term BP goal normotensive  Hyperlipidemia Home meds:  pravastatin,  resumed in hospital LDL 78, goal < 70 Continue statin at discharge   Other Stroke Risk Factors Advanced Age >/= 28  Former Cigarette smoker CHF Coronary artery disease Obstructive sleep apnea, on CPAP at home  Other Active Problems GERD BPH COPD   Hospital day # 0  Beulah Gandy DNP,  ACNPC-AG   ATTENDING ATTESTATION: Same day note. No charge.   Dr. Reeves Forth evaluated pt independently, reviewed imaging, chart, labs. Discussed and formulated plan with the Resident/APP. Changes were made to the note where appropriate. Please see APP/resident note above for details.   Lilyann Gravelle,MD   To contact Stroke Continuity provider, please refer to [email or web address] After hours, contact General Neurology
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
## 2022-08-07 NOTE — Consult Note (Addendum)
NEURO HOSPITALIST CONSULT NOTE   Requesting physician: Dr. Pearline Cables  Reason for Consult: Vision changes  History obtained from:  Patient and Chart     HPI:                                                                                                                                          Mark Zamora is an 76 y.o. male with a PMHx of AAA, BPH, skin cancer, bilateral cataracts, depression, HTN, chronic sinusitis, HLD, chronic neck pain, OSA on CPAP, postherpetic neuralgia, spinal stenosis and severe aortic stenosis s/p TAVR 6 days ago who initially presented to the [facility] ED with a c/c of right eye visual problems beginning on Sunday morning. The vision changes consistent of multiple foci of visual obscuration involving all 4 quadrants of the visual fields of his right eye in a spotty distribution, described as numerous black dots in addition to some pinkish dots as well as "foggy" vision in that eye. His left eye was unaffected.   He denied any neck injury, neck pain, headache or head injury. Also without any focal limb weakness, facial droop, speech change or confusion.   At the [facility] ED a CT head was obtained, revealing cerebellar hypodensities suggestive of infarctions. He was then sent to [facility] for MRI to further assess for possible acute/subacute cerebellar stroke.   A description of his visual symptoms which had initially been obtained by Cardiology PA during a phone call with the patient early Saturday afternoon has been reviewed: "Patient called the answering service with concerns about vision changes.  Called and spoke with patient.  He had a TAVR earlier this week and then they noticed stents to black and pink spots in his eyes as well as some blurry vision in the eye.  He states his BP is also markedly elevated systolic BP in the 786V on the phone.  He is initially concerned about retinal detachment.  He denies any other strokelike symptoms.  Given sudden unilateral  vision changes and significantly elevated BP after recent surgery, I strongly recommended patient go to the ED to rule out stroke.  He was hesitant of this at first but then agreed.  He did not want to call 911 as this was very expensive but his son will drive him to the ED."  Home medications include ASA and pravastatin.   Past Medical History:  Diagnosis Date   AAA (abdominal aortic aneurysm) ([place]) 01/29/2017   2.9cm   Allergic rhinitis    Anxiety    Arthritis    l ankle   BPH (benign prostatic hypertrophy)    Cancer (HCC)    HX SKIN CANCER   Cataract, bilateral    Chronic back pain    Chronic left hip pain  Constipation    COPD (chronic obstructive pulmonary disease) ([place]) 2020   Depression    Essential hypertension    GERD (gastroesophageal reflux disease)    History of chronic sinusitis    History of skin cancer    Hyperlipidemia    Insomnia    Neck pain, chronic    Nocturia    OSA on CPAP    uses cpap   Pedal edema    Chronic   Postherpetic neuralgia    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 Ali Lowe & Dr. Lavonna Monarch   Severe aortic stenosis    Shingles    Spinal stenosis     Past Surgical History:  Procedure Laterality Date   ABDOMINAL AORTOGRAM N/A 05/31/2022   Procedure: ABDOMINAL AORTOGRAM;  Surgeon: Early Osmond, MD;  Location: [place] [facility];  Service: Cardiovascular;  Laterality: N/A;   ANKLE ARTHROSCOPY Left 01/2011   CATARACT EXTRACTION, BILATERAL     COLONOSCOPY     INTRAOPERATIVE TRANSTHORACIC ECHOCARDIOGRAM N/A 08/01/2022   Procedure: INTRAOPERATIVE TRANSTHORACIC ECHOCARDIOGRAM;  Surgeon: Early Osmond, MD;  Location: [place];  Service: Open Heart Surgery;  Laterality: N/A;   KNEE ARTHROSCOPY Left 04/19/2011   KNEE ARTHROSCOPY WITH MEDIAL MENISECTOMY Right 05/18/2015   Procedure: RIGHT KNEE ARTHROSCOPY WITH  MEDIAL MENISCAL DEBRIDEMENT;  Surgeon: Gaynelle Arabian, MD;  Location: [place];  Service: Orthopedics;  Laterality: Right;   pilonidal fistulectomy     RIGHT HEART CATH AND CORONARY ANGIOGRAPHY N/A 05/31/2022   Procedure: RIGHT HEART CATH AND CORONARY ANGIOGRAPHY;  Surgeon: Early Osmond, MD;  Location: [place] [facility];  Service: Cardiovascular;  Laterality: N/A;   TONSILLECTOMY     pt reports 1/2 still present   TOTAL HIP ARTHROPLASTY Left 12/12/2017   Procedure: LEFT TOTAL HIP ARTHROPLASTY ANTERIOR APPROACH;  Surgeon: Gaynelle Arabian, MD;  Location: [facility];  Service: Orthopedics;  Laterality: Left;   TRANSCATHETER AORTIC VALVE REPLACEMENT, TRANSFEMORAL Left 08/01/2022   Procedure: Transcatheter Aortic Valve Replacement, Transfemoral using Edwards 29 MM SAPIEN 3 Ultra;  Surgeon: Early Osmond, MD;  Location: [place];  Service: Open Heart Surgery;  Laterality: Left;  Transfemoral valve insertion.   TRANSURETHRAL RESECTION OF PROSTATE     TRANSURETHRAL RESECTION OF PROSTATE N/A 06/25/2015   Procedure: TRANSURETHRAL RESECTION OF THE PROSTATE WITH GYRUS INSTRUMENTS BLADDER BIOPSY AND FULGURATION;  Surgeon: Cleon Gustin, MD;  Location: [facility];  Service: Urology;  Laterality: N/A;    Family History  Problem Relation Age of Onset   Hypercholesterolemia Mother    Stroke Mother        diagnosed w/DM,CVD   CVA Father    Seizures Father              Social History:  reports that he quit smoking about 28 years ago. His smoking use included cigarettes. He has never used smokeless tobacco. He reports current alcohol use of about 3.0 standard drinks of alcohol per week. He reports that he does not use drugs.  Allergies  Allergen Reactions   Compazine [Prochlorperazine Edisylate] Anaphylaxis and Other (See Comments)    Pt states "any nausea med ending in "zine"  Seizures   Cymbalta [Duloxetine Hcl] Other (See Comments)    Urinary retention    Promethazine Anaphylaxis and Other (See Comments)    seizures   Codeine  Other (See Comments)    constipation  Other Other (See Comments)    All antiemetic meds ending -zine  Prefers to not take any narcotic pain meds    MEDICATIONS:                                                                                                                     No current facility-administered medications on file prior to encounter.   Current Outpatient Medications on File Prior to Encounter  Medication Sig Dispense Refill   acetaminophen (TYLENOL) 650 MG CR tablet Take 1,300 mg by mouth every 8 (eight) hours as needed for pain.     albuterol (VENTOLIN HFA) 108 (90 Base) MCG/ACT inhaler Inhale 1-2 puffs into the lungs every 6 (six) hours as needed for shortness of breath or wheezing.     amLODipine (NORVASC) 5 MG tablet Take 5 mg by mouth daily.     aspirin EC 81 MG tablet Take 1 tablet (81 mg total) by mouth daily. Swallow whole. 90 tablet 3   budesonide-formoterol (SYMBICORT) 160-4.5 MCG/ACT inhaler Inhale 2 puffs into the lungs 2 (two) times daily.     Cholecalciferol (VITAMIN D3) 50 MCG (2000 UT) capsule Take 2,000 Units by mouth daily.     Dextromethorphan-guaiFENesin (MUCINEX DM MAXIMUM STRENGTH) 60-1200 MG TB12 Take 1 tablet by mouth at bedtime.     diclofenac (VOLTAREN) 75 MG EC tablet Take 75 mg by mouth 2 (two) times daily.     doxazosin (CARDURA) 8 MG tablet Take 8 mg by mouth at bedtime.     famotidine (PEPCID) 40 MG tablet Take 40 mg by mouth every evening.     fluticasone (FLONASE) 50 MCG/ACT nasal spray Place 2 sprays into both nostrils at bedtime.     lisinopril (PRINIVIL,ZESTRIL) 40 MG tablet Take 40 mg by mouth daily.     Melatonin 10 MG TABS Take 20 mg by mouth at bedtime.     Multiple Vitamin (MULTIVITAMIN) capsule Take 1 capsule by mouth daily.     NON FORMULARY Pt uses a cpap nightly     omeprazole (PRILOSEC) 20 MG capsule Take 20 mg by mouth every morning.      potassium chloride SA (K-DUR,KLOR-CON) 20 MEQ tablet Take 20 mEq by mouth daily.       pravastatin (PRAVACHOL) 80 MG tablet Take 80 mg by mouth every evening.      Psyllium (METAMUCIL) 28.3 % POWD Take 1 Scoop by mouth daily.     spironolactone (ALDACTONE) 25 MG tablet Take 6.25 mg by mouth daily.     tiZANidine (ZANAFLEX) 4 MG capsule Take 1 capsule (4 mg total) by mouth 3 (three) times daily. 90 capsule 0   traMADol (ULTRAM) 50 MG tablet Take 1-2 tablets (50-100 mg total) by mouth every 6 (six) hours as needed (mild pain). 56 tablet 0   vitamin E 180 MG (400 UNITS) capsule Take 400 Units by mouth daily.     zolpidem (AMBIEN CR) 12.5 MG CR tablet Take 12.5 mg by mouth at bedtime.  Scheduled:  melatonin  10 mg Oral QHS     ROS:                                                                                                                                       Denies any focal weakness or incoordination. No confusion, aphasia, dysphagia or dizziness. Continues to see spots when his right eye is open and left is closed, but not with left open/right closed. Other ROS as per HPI.    Blood pressure (!) 187/89, pulse 73, temperature 98.1 F (36.7 C), resp. rate 16, height '5\' 4"'$  (1.626 m), weight 80.2 kg, SpO2 97 %.   General Examination:                                                                                                       Physical Exam  HEENT-  [place]/AT   Lungs- Respirations unlabored Extremities- No edema   Neurological Examination Mental Status: Alert, fully oriented, thought content appropriate.  Speech fluent without evidence of aphasia.  Able to follow all commands without difficulty. Cranial Nerves: II: Visual fields intact in all 4 quadrants of each eye, tested individually. Visual acuity OD: 20/200. Visual acuity OS: 20/70. Does not use corrective lenses. Does perceive moving dark spots more in his central vision when viewing the environment with his right eye, but not with his left. PERRL  III,IV, VI: No ptosis. EOMI. No nystagmus.  V: Temp  sensation equal bilaterally  VII: Smile symmetric VIII: Hearing intact to voice IX,X: No hoarseness XI: Symmetric shoulder shrug XII: Midline tongue extension Motor: BUE 5/5 proximally and distally BLE 5/5 proximally and distally  No pronator drift.  Sensory: Temp and light touch intact throughout, bilaterally, except for subjectively decreased temp sensation to RLE. Unreliable responses that shift sides on several trials when testing for DSS to BUE and BLE.   Deep Tendon Reflexes: 2+ and symmetric bilateral brachioradialis, patellae and achilles reflexes. Toes downgoing bilaterally.  Cerebellar: Subtle ataxia with FNF on the right. RAM impaired on the right. Mild to moderate ataxia with H-S on the right. No ataxia on the left.  Gait: Deferred  NIHSS: 3  Lab Results: Basic Metabolic Panel: Recent Labs  Lab 08/01/22 1154 08/01/22 1221 08/01/22 1309 08/02/22 0748 08/03/22 0217  NA 143 143 143 137 137  K 3.5 3.5 3.4* 3.2* 3.4*  CL 103 102 104 104 106  CO2  --   --   --  24 25  GLUCOSE 121* 119* 109* 123* 99  BUN '10 11 10 16 19  '$ CREATININE 0.60* 0.60* 0.60* 0.80 0.84  CALCIUM  --   --   --  8.9 8.9    CBC: Recent Labs  Lab 08/01/22 1154 08/01/22 1221 08/01/22 1309 08/02/22 0748 08/03/22 0217  WBC  --   --   --  15.7* 12.4*  HGB 13.3 12.6* 12.9* 13.1 11.2*  HCT 39.0 37.0* 38.0* 38.1* 33.8*  MCV  --   --   --  87.8 89.4  PLT  --   --   --  148* 118*    Cardiac Enzymes: No results for input(s): "CKTOTAL", "CKMB", "CKMBINDEX", "TROPONINI" in the last 168 hours.  Lipid Panel: No results for input(s): "CHOL", "TRIG", "HDL", "CHOLHDL", "VLDL", "LDLCALC" in the last 168 hours.  Imaging: MR BRAIN W WO CONTRAST  Result Date: 08/07/2022 CLINICAL DATA:  Right eye vision changes following TAVR EXAM: MRI HEAD AND ORBITS WITHOUT AND WITH CONTRAST TECHNIQUE: Multiplanar, multiecho pulse sequences of the brain and surrounding structures were obtained without and with  intravenous contrast. Multiplanar, multiecho pulse sequences of the orbits and surrounding structures were obtained including fat saturation techniques, before and after intravenous contrast administration. CONTRAST:  52m GADAVIST GADOBUTROL 1 MMOL/ML IV SOLN COMPARISON:  No prior MRI, correlation is made with CT head 08/06/2022 FINDINGS: MRI HEAD FINDINGS Brain: Restricted diffusion with ADC correlate in the bilateral cerebellar hemispheres (series 5, 54-64), primarily affecting the right PICA and bilateral superior cerebellar artery territory, consistent with acute infarct. These areas demonstrate T2 hyperintense signal, consistent with edema, with gyral swelling and sulcal effacement. This causes local mass effect on the fourth ventricle, which is effaced. No definite hydrocephalus. Susceptibility in the inferior right cerebellum (series 14, images 9-15), likely petechial hemorrhage. Some contrast enhancement is associated with the areas of infarction. Approximately 5 mm of right-to-left midline shift. No extra-axial collection. Confluent T2 hyperintense signal in the periventricular white matter and pons, likely the sequela of severe chronic small vessel ischemic disease. Remote left lateral cerebellar infarct. Dilated perivascular spaces in the basal ganglia and hippocampus. Vascular: Normal arterial flow voids. Skull and upper cervical spine: Normal marrow signal. Other: The mastoids are well aerated. MRI ORBITS FINDINGS Orbits: No traumatic or inflammatory finding. Globes, optic nerves, orbital fat, extraocular muscles, vascular structures, and lacrimal glands are normal. No abnormal enhancement. Status post bilateral lens replacements. Visualized sinuses: Clear. Soft tissues: Negative. IMPRESSION: 1. 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 The inferior right cerebellar infarct is associated with petechial hemorrhage.  2. Local mass effect in the posterior fossa with sulcal effacement and effacement of the fourth ventricle, without evidence of resulting hydrocephalus. 3. No acute finding in the orbits. These results were called by telephone at the time of interpretation on 08/07/2022 at 1:34 am to provider MESSER, who verbally acknowledged these results. Electronically Signed   By: AMerilyn BabaM.D.   On: 08/07/2022 01:35   MR ORBITS W WO CONTRAST  Result Date: 08/07/2022 CLINICAL DATA:  Right eye vision changes following TAVR EXAM: MRI HEAD AND ORBITS WITHOUT AND WITH CONTRAST TECHNIQUE: Multiplanar, multiecho pulse sequences of the brain and surrounding structures were obtained without and with intravenous contrast. Multiplanar, multiecho pulse sequences of the orbits and surrounding structures were obtained including fat saturation techniques, before and after intravenous contrast administration. CONTRAST:  877mGADAVIST GADOBUTROL 1 MMOL/ML IV SOLN COMPARISON:  No prior MRI, correlation is  made with CT head 08/06/2022 FINDINGS: MRI HEAD FINDINGS Brain: Restricted diffusion with ADC correlate in the bilateral cerebellar hemispheres (series 5, 54-64), primarily affecting the right PICA and bilateral superior cerebellar artery territory, consistent with acute infarct. These areas demonstrate T2 hyperintense signal, consistent with edema, with gyral swelling and sulcal effacement. This causes local mass effect on the fourth ventricle, which is effaced. No definite hydrocephalus. Susceptibility in the inferior right cerebellum (series 14, images 9-15), likely petechial hemorrhage. Some contrast enhancement is associated with the areas of infarction. Approximately 5 mm of right-to-left midline shift. No extra-axial collection. Confluent T2 hyperintense signal in the periventricular white matter and pons, likely the sequela of severe chronic small vessel ischemic disease. Remote left lateral cerebellar infarct. Dilated  perivascular spaces in the basal ganglia and hippocampus. Vascular: Normal arterial flow voids. Skull and upper cervical spine: Normal marrow signal. Other: The mastoids are well aerated. MRI ORBITS FINDINGS Orbits: No traumatic or inflammatory finding. Globes, optic nerves, orbital fat, extraocular muscles, vascular structures, and lacrimal glands are normal. No abnormal enhancement. Status post bilateral lens replacements. Visualized sinuses: Clear. Soft tissues: Negative. IMPRESSION: 1. 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 The inferior right cerebellar infarct is associated with petechial hemorrhage. 2. Local mass effect in the posterior fossa with sulcal effacement and effacement of the fourth ventricle, without evidence of resulting hydrocephalus. 3. No acute finding in the orbits. These results were called by telephone at the time of interpretation on 08/07/2022 at 1:34 am to provider MESSER, who verbally acknowledged these results. Electronically Signed   By: Merilyn Baba M.D.   On: 08/07/2022 01:35     Assessment: 76 year old male who is s/p TAVR 6 days ago, presenting with new onset of right monocular visual blurring and moving spot-like visual obscurations OD.  - Exam reveals mild right sided ataxia and decreased visual acuity OD in the context of multiple moving scotomas when viewing the environment with his right eye.  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 The inferior right cerebellar infarct is associated with mild petechial hemorrhage. There is moderate focal right sided mass effect in the posterior fossa with sulcal effacement and effacement of a portion of the right side of the fourth ventricle inferiorly, without evidence of resulting hydrocephalus.   - MRI orbits  with and without contrast: No acute finding in the orbits. No abnormal enhancement.  - Right sided ataxia on exam is referable to the right cerebellar infarction. His right monocular visual deficits/symptoms may be secondary to small embolic event involving the right eye. Suspect a cardioembolic source due to recent aortic valve manipulation in the setting of TAVR 6 days ago. Per the literature, risk of stroke is increased during TAVR as well as after the procedure, with the highest risk occurring in the week and month immediately after the procedure. Etiology is thought to be disrupted aortic plaque/calcifications.  - Symptoms began on Friday. Greatest risk for herniation from malignant swelling of a large cerebellar infarct is within 3-5 days after the infarct. Will therefore be past the window of greatest risk after Wednesday. Recommend monitoring in the hospital until at least Thursday.  - Repeat CT head 12 hours after the initial MRI here, to assess for stability (ordered for 1:30 PM today)   Recommendations: - Continue ASA and pravastatin - TTE with  special attention to his valve replacement. If negative, may need TEE as well - CTA of head and neck - Cardiac telemetry - Ophthalmology consult for dilated retinal exam OD. Assess for possible cholesterol emboli.  -  HgbA1c, fasting lipid panel - PT consult, OT consult, Speech consult - Frequent neuro checks - NPO until passes stroke swallow screen   Electronically signed: Dr. Kerney Elbe 08/07/2022, 2:22 AM
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
## 2022-08-07 NOTE — Plan of Care (Signed)

## 2022-08-07 NOTE — ED Notes (Signed)
Patient transported to CT 

## 2022-08-07 NOTE — Consult Note (Addendum)
Cardiology Consultation   Patient ID: Mark Zamora MRN: [id number]; DOB: 04/10/1946  Admit date: 08/06/2022 Date of Consult: 08/07/2022  PCP:  Maury Dus, [place] [facility] Cardiologist:  Early Osmond, MD        Patient Profile:   Mark Zamora is a 76 y.o. 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 Zigmund Daniel.  History of Present Illness:   2D echocardiogram on 05/17/2022 showed EF 55-60%, severe AS with mean grad 38 mmHg, peak grad 59.9 mmHg, AVA 0.62cm2, DVI 0.20, SVI 35. L/RHC 05/31/2022 showed mild nonobstructive coronary disease.  He was evaluated by the multidisciplinary valve team and underwent successful TAVR with a 29 mm Edwards Sapien 3 Ultra Resilia THV via the TF approach on 08/01/22 by Dr. Ali Lowe and Dr. Cyndia Bent. Post operative echo showed EF 55%, normally functioning TAVR with a mean gradient of 7 mmHg and no PVL. He had post op urinary retention and N/V but recovered and was discharged home on POD2 on home baby aspirin.   He was readmitted 08/06/22 with sudden unilateral vision changes right eye visual problems starting that morning.CT of the head showed cerebellar hypodensities suggestive of infarctions. MRI of the brain with acute infarct in the bilateral cerebellar hemispheres affecting the right PICA and bilateral superior cerebellar artery territory. Inferior right cerebellar infarct is associated with petechial hemorrhage. Local mass effect in the posterior fossa with sulcal and effacement of the fourth ventricle without evidence of hydrocephalus.  No acute findings in the orbits. Neuro consulted who felt this was cardioembolic source due to recent aortic valve manipulation in the setting of TAVR. Also felt greatest risk for herniation from malignant swelling of a large cerebellar infarct is  within 3-5 days after the infarct. Will therefore be past the window of greatest risk after Wednesday and recommend monitoring in the hospital until at least Thursday. Repeat CT today showed evolving recent bilateral cerebellar infarcts with no acute hemorrhage or worsening mass effect.   Cardiology asked to see for input. No CP or SOB. No LE edema, orthopnea or PND. No dizziness or syncope. No blood in stool or urine. No palpitations. Still having visual issues in right eye.     Past Medical History:  Diagnosis Date   AAA (abdominal aortic aneurysm) ([place]) 01/29/2017   2.9cm   Allergic rhinitis    Anxiety    Arthritis    l ankle   BPH (benign prostatic hypertrophy)    Cancer (HCC)    HX SKIN CANCER   Cataract, bilateral    Chronic back pain    Chronic left hip pain    Constipation    COPD (chronic obstructive pulmonary disease) ([place]) 2020   Depression    Essential hypertension    GERD (gastroesophageal reflux disease)    History of chronic sinusitis    History of skin cancer    Hyperlipidemia    Insomnia    Neck pain, chronic    Nocturia    OSA on CPAP    uses cpap   Pedal edema    Chronic   Postherpetic neuralgia    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 Ali Lowe & Dr. Lavonna Monarch   Severe aortic stenosis  Shingles    Spinal stenosis     Past Surgical History:  Procedure Laterality Date   ABDOMINAL AORTOGRAM N/A 05/31/2022   Procedure: ABDOMINAL AORTOGRAM;  Surgeon: Early Osmond, MD;  Location: [facility];  Service: Cardiovascular;  Laterality: N/A;   ANKLE ARTHROSCOPY Left 01/2011   CATARACT EXTRACTION, BILATERAL     COLONOSCOPY     INTRAOPERATIVE TRANSTHORACIC ECHOCARDIOGRAM N/A 08/01/2022   Procedure: INTRAOPERATIVE TRANSTHORACIC ECHOCARDIOGRAM;  Surgeon: Early Osmond, MD;  Location: [facility];  Service: Open Heart Surgery;  Laterality: N/A;   KNEE ARTHROSCOPY Left  04/19/2011   KNEE ARTHROSCOPY WITH MEDIAL MENISECTOMY Right 05/18/2015   Procedure: RIGHT KNEE ARTHROSCOPY WITH  MEDIAL MENISCAL DEBRIDEMENT;  Surgeon: Gaynelle Arabian, MD;  Location: [facility];  Service: Orthopedics;  Laterality: Right;   pilonidal fistulectomy     RIGHT HEART CATH AND CORONARY ANGIOGRAPHY N/A 05/31/2022   Procedure: RIGHT HEART CATH AND CORONARY ANGIOGRAPHY;  Surgeon: Early Osmond, MD;  Location: [facility];  Service: Cardiovascular;  Laterality: N/A;   TONSILLECTOMY     pt reports 1/2 still present   TOTAL HIP ARTHROPLASTY Left 12/12/2017   Procedure: LEFT TOTAL HIP ARTHROPLASTY ANTERIOR APPROACH;  Surgeon: Gaynelle Arabian, MD;  Location: [facility];  Service: Orthopedics;  Laterality: Left;   TRANSCATHETER AORTIC VALVE REPLACEMENT, TRANSFEMORAL Left 08/01/2022   Procedure: Transcatheter Aortic Valve Replacement, Transfemoral using Edwards 29 MM SAPIEN 3 Ultra;  Surgeon: Early Osmond, MD;  Location: [facility];  Service: Open Heart Surgery;  Laterality: Left;  Transfemoral valve insertion.   TRANSURETHRAL RESECTION OF PROSTATE     TRANSURETHRAL RESECTION OF PROSTATE N/A 06/25/2015   Procedure: TRANSURETHRAL RESECTION OF THE PROSTATE WITH GYRUS INSTRUMENTS BLADDER BIOPSY AND FULGURATION;  Surgeon: Cleon Gustin, MD;  Location: [facility];  Service: Urology;  Laterality: N/A;     Home Medications:  Prior to Admission medications   Medication Sig Start Date End Date Taking? Authorizing Provider  acetaminophen (TYLENOL) 650 MG CR tablet Take 1,300 mg by mouth every 8 (eight) hours as needed for pain.   Yes [provider]  albuterol (VENTOLIN HFA) 108 (90 Base) MCG/ACT inhaler Inhale 1-2 puffs into the lungs every 6 (six) hours as needed for shortness of breath or wheezing. 04/10/22  Yes [provider]  amLODipine (NORVASC) 5 MG tablet Take 5 mg by mouth daily.   Yes [provider]  aspirin EC 81 MG tablet Take 1 tablet (81 mg  total) by mouth daily. Swallow whole. 05/22/22  Yes Early Osmond, MD  budesonide-formoterol [facility]) 160-4.5 MCG/ACT inhaler Inhale 2 puffs into the lungs 2 (two) times daily.   Yes [provider]  Cholecalciferol (VITAMIN D3) 50 MCG (2000 UT) capsule Take 2,000 Units by mouth daily.   Yes [provider]  Dextromethorphan-guaiFENesin (MUCINEX DM MAXIMUM STRENGTH) 60-1200 MG TB12 Take 1 tablet by mouth at bedtime.   Yes [provider]  diclofenac (VOLTAREN) 75 MG EC tablet Take 75 mg by mouth 2 (two) times daily. 06/13/22  Yes [provider]  doxazosin (CARDURA) 8 MG tablet Take 8 mg by mouth at bedtime.   Yes [provider]  famotidine (PEPCID) 40 MG tablet Take 40 mg by mouth every evening.   Yes [provider]  fluticasone (FLONASE) 50 MCG/ACT nasal spray Place 2 sprays into both nostrils at bedtime.   Yes [provider]  lisinopril (PRINIVIL,ZESTRIL) 40 MG tablet Take 40 mg by mouth daily.  Yes [provider]  Melatonin 10 MG TABS Take 20 mg by mouth at bedtime.   Yes [provider]  Multiple Vitamin (MULTIVITAMIN) capsule Take 1 capsule by mouth daily.   Yes [provider]  potassium chloride SA (K-DUR,KLOR-CON) 20 MEQ tablet Take 20 mEq by mouth daily.    Yes [provider]  pravastatin (PRAVACHOL) 80 MG tablet Take 80 mg by mouth every evening.    Yes [provider]  Psyllium (METAMUCIL) 28.3 % POWD Take 1 Scoop by mouth daily.   Yes [provider]  spironolactone (ALDACTONE) 25 MG tablet Take 6.25 mg by mouth daily.   Yes [provider]  tiZANidine (ZANAFLEX) 4 MG capsule Take 1 capsule (4 mg total) by mouth 3 (three) times daily. 12/13/17  Yes Perkins, Alexzandrew L, PA-C  traMADol (ULTRAM) 50 MG tablet Take 1-2 tablets (50-100 mg total) by mouth every 6 (six) hours as needed (mild pain). 12/13/17  Yes Perkins, Alexzandrew L, PA-C  vitamin E 180 MG  (400 UNITS) capsule Take 400 Units by mouth daily.   Yes [provider]  zolpidem (AMBIEN CR) 12.5 MG CR tablet Take 12.5 mg by mouth at bedtime.   Yes [provider]  NON FORMULARY Pt uses a cpap nightly    [provider]    Inpatient Medications: Scheduled Meds:  aspirin  81 mg Oral Daily   docusate sodium  100 mg Oral BID   famotidine  40 mg Oral QPM   melatonin  10 mg Oral QHS   mometasone-formoterol  2 puff Inhalation BID   pantoprazole  40 mg Oral Daily   pravastatin  80 mg Oral QPM   Continuous Infusions:  PRN Meds: acetaminophen **OR** acetaminophen, hydrALAZINE  Allergies:    Allergies  Allergen Reactions   Compazine [Prochlorperazine Edisylate] Anaphylaxis and Other (See Comments)    Pt states "any nausea med ending in "zine"  Seizures   Cymbalta [Duloxetine Hcl] Other (See Comments)    Urinary retention    Promethazine Anaphylaxis and Other (See Comments)    seizures   Codeine Other (See Comments)    constipation   Other Other (See Comments)    All antiemetic meds ending -zine  Prefers to not take any narcotic pain meds    Social History:   Social History   Socioeconomic History   Marital status: Married    Spouse name: Mark Zamora   Number of children: 2   Years of education: Not on file   Highest education level: Not on file  Occupational History   Not on file  Tobacco Use   Smoking status: Former    Types: Cigarettes    Quit date: 05/10/1994    Years since quitting: 28.2   Smokeless tobacco: Never  Vaping Use   Vaping Use: Never used  Substance and Sexual Activity   Alcohol use: Yes    Alcohol/week: 3.0 standard drinks of alcohol    Types: 3 Shots of liquor per week    Comment: 7 DRINKS PER WEEK   Drug use: No   Sexual activity: Not on file  Other Topics Concern   Not on file  Social History Narrative   Not on file   Social Determinants of Health   Financial Resource Strain: Not on file  Food Insecurity: Not  on file  Transportation Needs: Not on file  Physical Activity: Not on file  Stress: Not on file  Social Connections: Not on file  Intimate Partner Violence:  Not on file    Family History:   Family History  Problem Relation Age of Onset   Hypercholesterolemia Mother    Stroke Mother        diagnosed w/DM,CVD   CVA Father    Seizures Father      ROS:  Please see the history of present illness.  All other ROS reviewed and negative.     Physical Exam/Data:   Vitals:   08/07/22 0745 08/07/22 0815 08/07/22 0953 08/07/22 1000  BP: (!) 178/88 (!) 156/99  (!) 145/86  Pulse: 71 91  70  Resp: '19 17  19  '$ Temp:   98.3 F (36.8 C)   TempSrc:   Oral   SpO2: 93% 96%  98%  Weight:      Height:       No intake or output data in the 24 hours ending 08/07/22 1256    08/06/2022    6:34 PM 08/03/2022    5:45 AM 08/01/2022    8:39 AM  Last 3 Weights  Weight (lbs) 176 lb 12.9 oz 176 lb 12.8 oz 175 lb  Weight (kg) 80.2 kg 80.196 kg 79.379 kg     Body mass index is 30.35 kg/m.  General:  Well nourished, well developed, in no acute distress HEENT: normal Neck: no JVD Vascular: No carotid bruits; Distal pulses 2+ bilaterally Cardiac:  normal S1, S2; RRR; no murmur  Lungs:  clear to auscultation bilaterally, no wheezing, rhonchi or rales  Abd: soft, nontender, no hepatomegaly  Ext: no edema Musculoskeletal:  No deformities, BUE and BLE strength normal and equal Skin: warm and dry. Mild ecchymosis on groins.  Neuro:  CNs 2-12 intact, no focal abnormalities noted Psych:  Normal affect   EKG:  The EKG was personally reviewed and demonstrates:  sinus with PVC. HR 64 Telemetry:  Telemetry was personally reviewed and demonstrates:  sinus  Relevant CV Studies:  Echo 08/07/22 IMPRESSIONS  1. Left ventricular ejection fraction, by estimation, is 55 to 60%. The  left ventricle has normal function. The left ventricle has no regional  wall motion abnormalities. Left ventricular diastolic  parameters are  consistent with Grade I diastolic  dysfunction (impaired relaxation).   2. Right ventricular systolic function is normal. The right ventricular  size is normal. Tricuspid regurgitation signal is inadequate for assessing  PA pressure.   3. The mitral valve is normal in structure. No evidence of mitral valve  regurgitation. No evidence of mitral stenosis.   4. 29 mm Sapien Valve without no PVL or evidence of prosthetic stenosis..  The aortic valve has been repaired/replaced. Aortic valve regurgitation is  not visualized. No aortic stenosis is present.   5. The inferior vena cava is normal in size with greater than 50%  respiratory variability, suggesting right atrial pressure of 3 mmHg.   Comparison(s): No significant change from prior study.   Laboratory Data:  High Sensitivity Troponin:  No results for input(s): "TROPONINIHS" in the last 720 hours.   Chemistry Recent Labs  Lab 08/02/22 0748 08/03/22 0217 08/07/22 0723  NA 137 137 139  K 3.2* 3.4* 3.4*  CL 104 106 106  CO2 '24 25 24  '$ GLUCOSE 123* 99 94  BUN '16 19 11  '$ CREATININE 0.80 0.84 0.69  CALCIUM 8.9 8.9 9.2  MG  --   --  2.0  GFRNONAA >60 >60 >60  ANIONGAP '9 6 9    '$ Recent Labs  Lab 08/07/22 0723  PROT 6.8  ALBUMIN 3.5  AST 23  ALT 18  ALKPHOS 58  BILITOT 0.8   Lipids  Recent Labs  Lab 08/07/22 0723  CHOL 129  TRIG 61  HDL 39*  LDLCALC 78  CHOLHDL 3.3    Hematology Recent Labs  Lab 08/02/22 0748 08/03/22 0217 08/07/22 0723  WBC 15.7* 12.4* 8.5  RBC 4.34 3.78* 4.19*  HGB 13.1 11.2* 12.8*  HCT 38.1* 33.8* 37.2*  MCV 87.8 89.4 88.8  MCH 30.2 29.6 30.5  MCHC 34.4 33.1 34.4  RDW 14.0 14.4 13.8  PLT 148* 118* 142*   Thyroid No results for input(s): "TSH", "FREET4" in the last 168 hours.  BNPNo results for input(s): "BNP", "PROBNP" in the last 168 hours.  DDimer No results for input(s): "DDIMER" in the last 168 hours.   Radiology/Studies:  ECHOCARDIOGRAM COMPLETE  Result  Date: 08/07/2022    ECHOCARDIOGRAM REPORT   Patient Name:   Mark Zamora Date of Exam: 08/07/2022 Medical Rec #:  [id number]          Height:       64.0 in Accession #:    [id number]         Weight:       176.8 lb Date of Birth:  [birth year]         BSA:          1.857 m Patient Age:    3 years           BP:           145/86 mmHg Patient Gender: M                  HR:           66 bpm. Exam Location:  Inpatient Procedure: 2D Echo, Cardiac Doppler and Color Doppler Indications:    Stroke  History:        Patient has prior history of Echocardiogram examinations, most                 recent 08/02/2022. AAA, TAVR (08/01/22), COPD, Aortic Valve                 Disease; Risk Factors:Sleep Apnea, Former Smoker, Hypertension,                 Dyslipidemia and Alcohol.  Sonographer:    Eartha Inch Referring Phys: August Albino  Sonographer Comments: Technically difficult study due to poor echo windows. Image acquisition challenging due to patient body habitus and Image acquisition challenging due to respiratory motion. IMPRESSIONS  1. Left ventricular ejection fraction, by estimation, is 55 to 60%. The left ventricle has normal function. The left ventricle has no regional wall motion abnormalities. Left ventricular diastolic parameters are consistent with Grade I diastolic dysfunction (impaired relaxation).  2. Right ventricular systolic function is normal. The right ventricular size is normal. Tricuspid regurgitation signal is inadequate for assessing PA pressure.  3. The mitral valve is normal in structure. No evidence of mitral valve regurgitation. No evidence of mitral stenosis.  4. 29 mm Sapien Valve without no PVL or evidence of prosthetic stenosis.. The aortic valve has been repaired/replaced. Aortic valve regurgitation is not visualized. No aortic stenosis is present.  5. The inferior vena cava is normal in size with greater than 50% respiratory variability, suggesting right atrial pressure of 3 mmHg.  Comparison(s): No significant change from prior study. FINDINGS  Left Ventricle: Left ventricular ejection fraction, by estimation, is 55 to 60%. The left ventricle has normal  function. The left ventricle has no regional wall motion abnormalities. The left ventricular internal cavity size was normal in size. Suboptimal image quality limits for assessment of left ventricular hypertrophy. Left ventricular diastolic parameters are consistent with Grade I diastolic dysfunction (impaired relaxation). Right Ventricle: The right ventricular size is normal. No increase in right ventricular wall thickness. Right ventricular systolic function is normal. Tricuspid regurgitation signal is inadequate for assessing PA pressure. Left Atrium: Left atrial size was normal in size. Right Atrium: Right atrial size was normal in size. Pericardium: There is no evidence of pericardial effusion. Mitral Valve: The mitral valve is normal in structure. No evidence of mitral valve regurgitation. No evidence of mitral valve stenosis. Tricuspid Valve: The tricuspid valve is normal in structure. Tricuspid valve regurgitation is not demonstrated. No evidence of tricuspid stenosis. Aortic Valve: 29 mm Sapien Valve without no PVL or evidence of prosthetic stenosis. The aortic valve has been repaired/replaced. Aortic valve regurgitation is not visualized. No aortic stenosis is present. Aortic valve mean gradient measures 8.0 mmHg. Aortic valve peak gradient measures 12.8 mmHg. Aortic valve area, by VTI measures 2.56 cm. Pulmonic Valve: The pulmonic valve was normal in structure. Pulmonic valve regurgitation is not visualized. No evidence of pulmonic stenosis. Aorta: The aortic root is normal in size and structure. Venous: The inferior vena cava is normal in size with greater than 50% respiratory variability, suggesting right atrial pressure of 3 mmHg. IAS/Shunts: No atrial level shunt detected by color flow Doppler.  LEFT VENTRICLE PLAX 2D LVIDd:          4.80 cm   Diastology LVIDs:         3.40 cm   LV e' medial:    6.40 cm/s LV PW:         1.00 cm   LV E/e' medial:  9.2 LV IVS:        1.00 cm   LV e' lateral:   5.96 cm/s LVOT diam:     2.20 cm   LV E/e' lateral: 9.8 LV SV:         81 LV SV Index:   44 LVOT Area:     3.80 cm  RIGHT VENTRICLE             IVC RV S prime:     16.50 cm/s  IVC diam: 1.30 cm TAPSE (M-mode): 2.0 cm LEFT ATRIUM             Index        RIGHT ATRIUM           Index LA diam:        3.20 cm 1.72 cm/m   RA Area:     12.10 cm LA Vol (A2C):   49.3 ml 26.55 ml/m  RA Volume:   20.30 ml  10.93 ml/m LA Vol (A4C):   54.3 ml 29.25 ml/m LA Biplane Vol: 56.3 ml 30.33 ml/m  AORTIC VALVE AV Area (Vmax):    2.23 cm AV Area (Vmean):   2.14 cm AV Area (VTI):     2.56 cm AV Vmax:           179.00 cm/s AV Vmean:          136.000 cm/s AV VTI:            0.318 m AV Peak Grad:      12.8 mmHg AV Mean Grad:      8.0 mmHg LVOT Vmax:  105.00 cm/s LVOT Vmean:        76.400 cm/s LVOT VTI:          0.214 m LVOT/AV VTI ratio: 0.67  AORTA Ao Root diam: 2.90 cm MITRAL VALVE MV Area (PHT): 1.62 cm     SHUNTS MV Decel Time: 467 msec     Systemic VTI:  0.21 m MV E velocity: 58.60 cm/s   Systemic Diam: 2.20 cm MV A velocity: 113.00 cm/s MV E/A ratio:  0.52 Rudean Haskell MD Electronically signed by Rudean Haskell MD Signature Date/Time: 08/07/2022/12:33:34 PM    Final    CT HEAD WO CONTRAST (5MM)  Result Date: 08/07/2022 CLINICAL DATA:  Stroke follow-up EXAM: CT HEAD WITHOUT CONTRAST TECHNIQUE: Contiguous axial images were obtained from the base of the skull through the vertex without intravenous contrast. RADIATION DOSE REDUCTION: This exam was performed according to the departmental dose-optimization program which includes automated exposure control, adjustment of the mA and/or kV according to patient size and/or use of iterative reconstruction technique. COMPARISON:  Recent CT and MR imaging FINDINGS: Brain: Acute and chronic bilateral  cerebellar infarcts are again identified. No obstructive hydrocephalus. No acute intracranial hemorrhage. Stable findings of parenchymal volume loss and probable chronic microvascular ischemic changes. No extra-axial collection. Vascular: There is atherosclerotic calcification at the skull base. Skull: Calvarium is unremarkable. Sinuses/Orbits: No acute finding. Other: Mastoid air cells are clear. IMPRESSION: Evolving recent bilateral cerebellar infarcts. No acute hemorrhage or worsening mass effect. Electronically Signed   By: Macy Mis M.D.   On: 08/07/2022 09:12   MR BRAIN W WO CONTRAST  Result Date: 08/07/2022 CLINICAL DATA:  Right eye vision changes following TAVR EXAM: MRI HEAD AND ORBITS WITHOUT AND WITH CONTRAST TECHNIQUE: Multiplanar, multiecho pulse sequences of the brain and surrounding structures were obtained without and with intravenous contrast. Multiplanar, multiecho pulse sequences of the orbits and surrounding structures were obtained including fat saturation techniques, before and after intravenous contrast administration. CONTRAST:  89m GADAVIST GADOBUTROL 1 MMOL/ML IV SOLN COMPARISON:  No prior MRI, correlation is made with CT head 08/06/2022 FINDINGS: MRI HEAD FINDINGS Brain: Restricted diffusion with ADC correlate in the bilateral cerebellar hemispheres (series 5, 54-64), primarily affecting the right PICA and bilateral superior cerebellar artery territory, consistent with acute infarct. These areas demonstrate T2 hyperintense signal, consistent with edema, with gyral swelling and sulcal effacement. This causes local mass effect on the fourth ventricle, which is effaced. No definite hydrocephalus. Susceptibility in the inferior right cerebellum (series 14, images 9-15), likely petechial hemorrhage. Some contrast enhancement is associated with the areas of infarction. Approximately 5 mm of right-to-left midline shift. No extra-axial collection. Confluent T2 hyperintense signal in the  periventricular white matter and pons, likely the sequela of severe chronic small vessel ischemic disease. Remote left lateral cerebellar infarct. Dilated perivascular spaces in the basal ganglia and hippocampus. Vascular: Normal arterial flow voids. Skull and upper cervical spine: Normal marrow signal. Other: The mastoids are well aerated. MRI ORBITS FINDINGS Orbits: No traumatic or inflammatory finding. Globes, optic nerves, orbital fat, extraocular muscles, vascular structures, and lacrimal glands are normal. No abnormal enhancement. Status post bilateral lens replacements. Visualized sinuses: Clear. Soft tissues: Negative. IMPRESSION: 1. 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 The inferior right cerebellar infarct is associated with petechial hemorrhage. 2. Local mass effect in the posterior fossa with sulcal effacement and effacement of the fourth ventricle, without evidence of resulting hydrocephalus. 3. No acute finding in  the orbits. These results were called by telephone at the time of interpretation on 08/07/2022 at 1:34 am to provider MESSER, who verbally acknowledged these results. Electronically Signed   By: Merilyn Baba M.D.   On: 08/07/2022 01:35   MR ORBITS W WO CONTRAST  Result Date: 08/07/2022 CLINICAL DATA:  Right eye vision changes following TAVR EXAM: MRI HEAD AND ORBITS WITHOUT AND WITH CONTRAST TECHNIQUE: Multiplanar, multiecho pulse sequences of the brain and surrounding structures were obtained without and with intravenous contrast. Multiplanar, multiecho pulse sequences of the orbits and surrounding structures were obtained including fat saturation techniques, before and after intravenous contrast administration. CONTRAST:  32m GADAVIST GADOBUTROL 1 MMOL/ML IV SOLN COMPARISON:  No prior MRI, correlation is made with CT head 08/06/2022 FINDINGS: MRI HEAD FINDINGS Brain: Restricted diffusion with  ADC correlate in the bilateral cerebellar hemispheres (series 5, 54-64), primarily affecting the right PICA and bilateral superior cerebellar artery territory, consistent with acute infarct. These areas demonstrate T2 hyperintense signal, consistent with edema, with gyral swelling and sulcal effacement. This causes local mass effect on the fourth ventricle, which is effaced. No definite hydrocephalus. Susceptibility in the inferior right cerebellum (series 14, images 9-15), likely petechial hemorrhage. Some contrast enhancement is associated with the areas of infarction. Approximately 5 mm of right-to-left midline shift. No extra-axial collection. Confluent T2 hyperintense signal in the periventricular white matter and pons, likely the sequela of severe chronic small vessel ischemic disease. Remote left lateral cerebellar infarct. Dilated perivascular spaces in the basal ganglia and hippocampus. Vascular: Normal arterial flow voids. Skull and upper cervical spine: Normal marrow signal. Other: The mastoids are well aerated. MRI ORBITS FINDINGS Orbits: No traumatic or inflammatory finding. Globes, optic nerves, orbital fat, extraocular muscles, vascular structures, and lacrimal glands are normal. No abnormal enhancement. Status post bilateral lens replacements. Visualized sinuses: Clear. Soft tissues: Negative. IMPRESSION: 1. 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 The inferior right cerebellar infarct is associated with petechial hemorrhage. 2. Local mass effect in the posterior fossa with sulcal effacement and effacement of the fourth ventricle, without evidence of resulting hydrocephalus. 3. No acute finding in the orbits. These results were called by telephone at the time of interpretation on 08/07/2022 at 1:34 am to provider MESSER, who verbally acknowledged these results. Electronically Signed   By: AMerilyn BabaM.D.    On: 08/07/2022 01:35     Assessment and Plan:   Acute CVA: appreciate neuro input. Felt to be cardioembolic source due to recent aortic valve manipulation in the setting of TAVR. Also felt greatest risk for herniation from malignant swelling of a large cerebellar infarct is within 3-5 days after the infarct. Neuro plans to keep pt inpatient unitl Thursday. Repeat CT today showed evolving recent bilateral cerebellar infarcts with no acute hemorrhage or worsening mass effect. Antiplatelets per neuro. Continue baby Asprin for now. Keep on tele to monitor for afib. Repeat TTE completed today and stable.    Severe AS s/p TAVR: stable on POD 1 echo. Repeat echo today with normal EF and stable TAVR. Continue aspirin. Groin bandages removed. Some mild ecchymosis noted.   HTN: BP elevated. Allowing for permissive HTN given acute CVA. Will defer management to neuro.   Risk Assessment/Risk Scores:        For questions or updates, please contact [place] CityPlease consult [email or web address] for contact info under    Signed, KAngelena Form PA-C  08/07/2022 12:56 PM  I  have personally seen and examined this patient. I agree with the assessment and plan as outlined above.  76 yo male with history of COPD, AAA, HTN, sleep apnea, spinal stenosis and severe AS who underwent TAVR last week with placement of a Edwards Sapien 3 Ultra Resilia THV from the femoral approach per Dr. Ali Lowe and Dr. Cyndia Bent. He did well following his procedure and was discharged home on POD 2. Valve working well by POD 1 echo. He was admitted 08/06/22 with unilateral visual changes and was found to have acute infarct by MRI in the bilateral cerebellar hemispheres affecting the right PICA and bilateral superior cerebellar artery territory. Inferior right cerebellar infarct is associated with petechial hemorrhage. Local mass effect in the posterior fossa with sulcal and effacement of the fourth ventricle without evidence of  hydrocephalus.  No acute findings in the orbits. Neuro consulted who felt this was cardioembolic source due to recent aortic valve manipulation in the setting of TAVR. Also felt greatest risk for herniation from malignant swelling of a large cerebellar infarct is within 3-5 days after the infarct. Today he is feeling well but with continued visual issues in the right eye.  Labs reviewed.  Echo reviewed by me and shows LVEF=55-60%. Normally functioning aortic valve bioprosthesis.  EKG with sinus with PVC My exam:  General: Well developed, well nourished, NAD  HEENT: OP clear, mucus membranes moist  SKIN: warm, dry. No rashes. Neuro: No major defects Musculoskeletal: Muscle strength 5/5 all ext  Psychiatric: Mood and affect normal  Neck: No JVD Lungs:Clear bilaterally, no wheezes, rhonci, crackles Cardiovascular: Regular rate and rhythm. No murmurs, gallops or rubs. Abdomen:Soft. BS + Extremities: No lower extremity edema.   Plan: CVA post TAVR placement: Most likely embolic event following TAVR with source from aortic arch manipulation or from the old aortic valve. Appreciate Neuro team involvement. He would be a candidate for Plavix if felt to be appropriate by Neuro team. Continue ASA for now. No indication for further cardiac workup.   Lauree Chandler, MD, [facility] 08/07/2022 1:15 PM

## 2022-08-07 NOTE — ED Notes (Signed)
Pt ambulatory to and from restroom with steady gait 

## 2022-08-07 NOTE — Plan of Care (Signed)
  Problem: Health Behavior/Discharge Planning: Goal: Ability to manage health-related needs will improve 08/07/2022 2055 by Albina Billet, RN Outcome: Progressing 08/07/2022 2046 by Albina Billet, RN Outcome: Progressing   Problem: Clinical Measurements: Goal: Ability to maintain clinical measurements within normal limits will improve 08/07/2022 2055 by Albina Billet, RN Outcome: Progressing 08/07/2022 2046 by Albina Billet, RN Outcome: Progressing   Problem: Education: Goal: Knowledge of General Education information will improve Description: Including pain rating scale, medication(s)/side effects and non-pharmacologic comfort measures 08/07/2022 2055 by Albina Billet, RN Outcome: Progressing 08/07/2022 2046 by Albina Billet, RN Outcome: Progressing

## 2022-08-07 NOTE — ED Notes (Signed)
Patient transported to echo ?

## 2022-08-07 NOTE — ED Notes (Signed)
Lunch tray at bedside. ?

## 2022-08-07 NOTE — ED Notes (Signed)
Pt is ambulating to restroom at this time using his cane

## 2022-08-07 NOTE — ED Notes (Signed)
Pt returned from CT °

## 2022-08-07 NOTE — ED Notes (Signed)
Breakfast order placed ?

## 2022-08-07 NOTE — ED Notes (Signed)
Received verbal report from Lorren B RN at this time 

## 2022-08-07 NOTE — Plan of Care (Signed)
Patient admitted today 76 year old male status post TAVR 08/01/2022 and discharged 08/03/2022 with history of AAA, depression hypertension hyperlipidemia obstructive sleep apnea spinal stenosis BPH admitted with sudden unilateral vision changes right eye visual problems starting Sunday morning.  Left eye is unaffected.  Denies any injury.  CT of the head showed cerebellar hypodensities suggestive of infarctions. MRI of the brain with acute infarct in the bilateral cerebellar hemispheres affecting the right PICA and bilateral superior cerebellar artery territory.  Inferior right cerebellar infarct is associated with petechial hemorrhage.  Local mass effect in the posterior fossa with sulcal and effacement of the fourth ventricle without evidence of hydrocephalus.  No acute findings in the orbits. Neuro consulted Echocardiogram CT angiogram head and neck Aspirin statin PT OT speech Lipid panel and A1c A1c is 5.4 Will let cardiology know of patient's admission. Sent message to ophthalmology on call Dr. Posey Pronto for eye exam.

## 2022-08-07 NOTE — ED Notes (Addendum)
Pt in echo

## 2022-08-07 NOTE — ED Notes (Signed)
Contacted 5w charge nurse reference if bed is cleaned and ready and reference the assigned nurse hasn't responded to a message for over 2hrs. Was advised he would assign the night shift nurse for report and the bed wasn't available. Charge nurse Gretta Cool made aware of same

## 2022-08-08 DIAGNOSIS — Z952 Presence of prosthetic heart valve: Secondary | ICD-10-CM | POA: Diagnosis not present

## 2022-08-08 DIAGNOSIS — I639 Cerebral infarction, unspecified: Secondary | ICD-10-CM | POA: Diagnosis not present

## 2022-08-08 LAB — LIPID PANEL
Cholesterol: 125 mg/dL (ref 0–200)
HDL: 37 mg/dL — ABNORMAL LOW (ref >40)
LDL Cholesterol: 75 mg/dL (ref 0–99)
Total CHOL/HDL Ratio: 3.4 RATIO
Triglycerides: 66 mg/dL (ref <150)
VLDL: 13 mg/dL (ref 0–40)

## 2022-08-08 MED ORDER — POLYETHYLENE GLYCOL 3350 17 G PO PACK
17.0000 g | PACK | Freq: Every day | ORAL | Status: DC
  Administered 2022-08-08 – 2022-08-10 (×3): 17 g via ORAL
  Filled 2022-08-08 (×3): qty 1

## 2022-08-08 MED ORDER — ZOLPIDEM TARTRATE 5 MG PO TABS
5.0000 mg | ORAL_TABLET | Freq: Once | ORAL | Status: AC
  Administered 2022-08-08: 5 mg via ORAL
  Filled 2022-08-08: qty 1

## 2022-08-08 MED ORDER — ROSUVASTATIN CALCIUM 5 MG PO TABS
10.0000 mg | ORAL_TABLET | Freq: Every day | ORAL | Status: DC
  Administered 2022-08-09 – 2022-08-10 (×2): 10 mg via ORAL
  Filled 2022-08-08 (×2): qty 2

## 2022-08-08 MED ORDER — POTASSIUM CHLORIDE CRYS ER 20 MEQ PO TBCR
40.0000 meq | EXTENDED_RELEASE_TABLET | Freq: Once | ORAL | Status: AC
Start: 2022-08-08 — End: 2022-08-08
  Administered 2022-08-08: 40 meq via ORAL
  Filled 2022-08-08: qty 2

## 2022-08-08 MED ORDER — BISACODYL 10 MG RE SUPP
10.0000 mg | Freq: Once | RECTAL | Status: AC
Start: 2022-08-08 — End: 2022-08-08
  Administered 2022-08-08: 10 mg via RECTAL
  Filled 2022-08-08: qty 1

## 2022-08-08 MED FILL — Potassium Chloride Inj 2 mEq/ML: INTRAVENOUS | Qty: 40 | Status: AC

## 2022-08-08 MED FILL — Heparin Sodium (Porcine) Inj 1000 Unit/ML: Qty: 1000 | Status: AC

## 2022-08-08 MED FILL — Magnesium Sulfate Inj 50%: INTRAMUSCULAR | Qty: 10 | Status: AC

## 2022-08-08 NOTE — TOC Initial Note (Signed)
Transition of Care [facility]) - Initial/Assessment Note    Patient Details  Name: Mark Zamora MRN: [id number] Date of Birth: 01-13-1946  [facility] CM/SW Contact:    Ninfa Meeker, RN Phone Number: 08/08/2022, 10:01 AM  Clinical Narrative:   Case manager spoke with patient, and with his daughter Evalee Jefferson, concerning recommendation for  Home Health PT. Discussed [place], permission given to arrange [facility]. Referral called to Adela Lank, [facility] Liaison. Margarita Grizzle states that her mom is in a nursing home, patient lives alone but family assists as needed. He has a RW at home, lives in a ranch style home, only steps are to enter home.   Expected Discharge Plan: [place] Barriers to Discharge: Continued Medical Work up   Patient Goals and CMS Choice     Choice offered to / list presented to : Adult Children, Patient  Expected Discharge Plan and Services Expected Discharge Plan: [place] In-house Referral: NA Discharge Planning Services: CM Consult Post Acute Care Choice: Home Health Living arrangements for the past 2 months: Single Family Home                 DME Arranged: N/A         HH Arranged: PT HH Agency: [place] Date [facility] Agency Contacted: 08/08/22 Time HH Agency Contacted: 1000 Representative spoke with at [place]: Adela Lank  Prior Living Arrangements/Services Living arrangements for the past 2 months: [place] with:: Self Patient language and need for interpreter reviewed:: Yes Do you feel safe going back to the place where you live?: Yes      Need for Family Participation in Patient Care: Yes (Comment) Care giver support system in place?: Yes (comment)   Criminal Activity/Legal Involvement Pertinent to Current Situation/Hospitalization: No - Comment as needed  Activities of Daily Living Home Assistive Devices/Equipment: Cane (specify quad or straight),  CPAP ADL Screening (condition at time of admission) Patient's cognitive ability adequate to safely complete daily activities?: Yes Is the patient deaf or have difficulty hearing?: No Does the patient have difficulty seeing, even when wearing glasses/contacts?: No Does the patient have difficulty concentrating, remembering, or making decisions?: No Patient able to express need for assistance with ADLs?: No Does the patient have difficulty dressing or bathing?: No Independently performs ADLs?: Yes (appropriate for developmental age) Does the patient have difficulty walking or climbing stairs?: Yes Weakness of Legs: Both Weakness of Arms/Hands: None  Permission Sought/Granted Permission sought to share information with : Family Supports       Permission granted to share info w AGENCY: Alvis Lemmings        Emotional Assessment   Attitude/Demeanor/Rapport: Gracious   Orientation: : Oriented to Self, Oriented to Place, Oriented to  Time, Oriented to Situation Alcohol / Substance Use: Not Applicable Psych Involvement: No (comment)  Admission diagnosis:  Acute ischemic stroke ([place]) [I63.9] Change in vision [H53.9] Patient Active Problem List   Diagnosis Date Noted   Acute ischemic stroke ([place]) 08/07/2022   Hyperlipidemia    OSA on CPAP 08/01/2022   GERD (gastroesophageal reflux disease) 08/01/2022   Essential hypertension 08/01/2022   Severe aortic stenosis 08/01/2022   Spinal stenosis 08/01/2022   S/P TAVR (transcatheter aortic valve replacement) 08/01/2022   COPD (chronic obstructive pulmonary disease) ([place]) 2020   OA (osteoarthritis) of hip 12/12/2017   BPH (benign prostatic hyperplasia) 06/25/2015   Acute medial meniscal tear 05/18/2015   PCP:  Maury Dus, MD Pharmacy:   [facility] [address] [place] [address] [place] [address] Phone: [phone] Fax: [phone]     Social Determinants of Health (SDOH) Interventions     Readmission Risk Interventions     No data to display

## 2022-08-08 NOTE — Progress Notes (Signed)
PROGRESS NOTE    Mark Zamora  ZOX:[id number] DOB: 03-Oct-1946 DOA: 08/06/2022 PCP: Maury Dus, MD    Brief Narrative: HPI per Dr. Velia Meyer  08/07/2022  Mark Zamora is a 76 y.o. male with medical history significant for severe aortic stenosis status post TAVR on 08/01/2022, COPD, essential hypertension, hyperlipidemia, obstructive sleep apnea on nocturnal CPAP, who is admitted to [facility] on 08/06/2022 with acute ischemic stroke following a ED to ED transfer from [facility] ED to [facility] ED after presenting to the former facility complaining change in vision in right eye.    In the setting of severe aortic stenosis, the patient underwent TAVR at [facility] on 08/01/2022, and was discharged home on 08/03/2022.  The patient reports that he awoke on the morning of Friday, 08/04/2022, in his normal state of health, without any acute focal neurologic deficits or visual field changes at that time.  However, starting around 10 AM on 08/04/2022, as the patient reports acute onset change in his vision in the right eye, noting the appearance of dark-colored spots in all 4 quadrants of the right eye, while denying any visual changes in the left eye.  He notes that these changes in vision have persisted and are still present at this time.  No recent preceding trauma.   Denies any associated acute focal weakness, acute focal numbness, paresthesias, dysphagia, dizziness, vertigo, nausea, vomiting, word finding difficulties, slurred speech, facial droop, or headache.    He also denies any recent chest pain, shortness of breath, palpitations, diaphoresis, presyncope, or syncope.   No known history of previous stroke, and medical history notable for essential hypertension as well as hyperlipidemia, for which she is on pravastatin 80 mg p.o. daily at home.  He also has a history of obstructive sleep apnea, reporting good corresponding compliance with home nocturnal CPAP.  No use of recreational drugs,  and the patient confirms that he is a former smoker, having completely quit smoking the mid 1990s, without subsequent recurrence.  Denies any known history of diabetes or paroxysmal atrial fibrillation.  Notes that he is on a daily baby aspirin but otherwise no blood thinners as an outpatient.   He subsequently presented to [facility] emergency department for further evaluation of the aforementioned acute changes in vision in his right eye.    While  at [facility] ED, he underwent CT head that showed no evidence of acute process, including no evidence of intracranial hemorrhage.  He subsequently underwent ED to ED transfer from [facility] to [facility] to expedite pursuit of MRI brain to further evaluate the above.   Of note, the patient underwent most recent echocardiogram on  08/02/22, which was postop day 1 status post TAVR.  This echocardiogram is notable for LVEF 55 to 60%, moderate concentric LVH, right ventricular systolic function normal, mild to moderately dilated left atrium, mildly dilated right atrium.   Mark Zamora ED Course:  Vital signs in the ED were notable for the following: Afebrile; heart rate 81-19; systolic blood pressure in the 150s to 180s mmHg; respiratory rate 16-18; oxygen saturation 96 to 97% on room air.   Imaging and additional notable ED work-up: MRI brain showed acute infarcts in the bilateral cerebellar hemispheres, affecting the right PICA and bilateral superior cerebellar artery territory.  The inferior right cerebellar infarct is associated with petechial hemorrhage.  Local mass effect in the posterior fossa with sulcal effacement and effacement of the fourth ventricle without evidence of resultant hydrocephalus.    Additionally, he  underwent MRI of the bilateral orbits, showing no evidence of acute process.   EKG shows sinus rhythm with single PVC, heart rate 64, normal intervals, and no evidence of T wave or ST changes, including no evidence of ST elevation.   [facility] EDP discussed  patient's case and imaging with the on-call neurologist, Dr. Cheral Marker, Who recommended admission to the hospital service for further stroke work-up, giving that neurology will formally consult, with additional recommendations pending at this time.    Subsequently, the patient was admitted for observation for acute ischemic stroke.  Assessment & Plan:   Principal Problem:   Acute ischemic stroke ([place]) Active Problems:   OSA on CPAP   GERD (gastroesophageal reflux disease)   Essential hypertension   COPD (chronic obstructive pulmonary disease) (HCC)   Hyperlipidemia   #1 acute ischemic stroke s/p TAVR 9/5 patient admitted with visual changes in the right eye likely due to cardioembolic source from recent TAVR surgery.  MRI shows acute infarcts in bilateral cerebellar hemispheres affecting the right peak and bilateral superior cerebellar artery territory concerning for emboli. Repeat echo 08/07/2022 with grade 1 diastolic dysfunction and ejection fraction 55 to 60%.  CTA head and neck with mild atherosclerosis without large vessel occlusion or high-grade stenosis.  Mild extrinsic narrowing of V2 segments due to cervical spondylosis. MRI brain acute infarct in bilateral cerebellar hemisphere affecting the right Picou and bilateral superior cerebellar artery territory.  Inferior right cerebellar infarct is associated with petechial hemorrhage.  Local mass effect in the posterior fossa with sulcal and effacement and effacement of the fourth ventricle without evidence of resulting hydrocephalus.  No acute findings in the orbits.   Repeat CT head 08/07/2022 evolving recent bilateral cerebellar infarcts.  No acute hemorrhage or worsening mass effect. A1c 5.2 LDL 75 Neuro following On aspirin and Plavix Discussed with Dr. Posey Pronto ophthalmologist.  He wants the patient to follow-up with him in the office for a dilated eye exam.  #2 status post TAVR for severe aortic stenosis-followed by cardiology signed  off 08/08/2022  #3 obstructive sleep apnea on CPAP  #4 hyperlipidemia LDL 75 on pravastatin  #5 essential hypertension on Norvasc and lisinopril at home Home have not restarted to allow permissive hypertension  #6 COPD stable  #7 constipation stool softeners ordered with Dulcolax suppository   Estimated body mass index is 30.35 kg/m as calculated from the following:   Height as of this encounter: '5\' 4"'$  (1.626 m).   Weight as of this encounter: 80.2 kg.  DVT prophylaxis: SCD  code Status: Full code Family Communication: None at bedside  disposition Plan:  Status is: Inpatient Remains inpatient appropriate because: Acute stroke   Consultants:  Neuro  and cardiology  Procedures: None Antimicrobials: None  Subjective: Patient is resting in bed complains of constipation.  Continues to have right eye spots Unsteady gait  Objective: Vitals:   08/07/22 2240 08/07/22 2345 08/08/22 0400 08/08/22 0845  BP:  (!) 164/89 (!) 162/72 (!) 169/84  Pulse: 88 60 69 61  Resp: '20 18 20 14  '$ Temp:  98.6 F (37 C) 98.3 F (36.8 C) 97.7 F (36.5 C)  TempSrc:  Oral Oral Oral  SpO2: 96% 96% 94% 95%  Weight:      Height:       No intake or output data in the 24 hours ending 08/08/22 1239 Filed Weights   08/06/22 1834  Weight: 80.2 kg    Examination:  General exam: Appears calm and comfortable  Respiratory system: Clear  to auscultation. Respiratory effort normal. Cardiovascular system: S1 & S2 heard, RRR. No JVD, murmurs, rubs, gallops or clicks. No pedal edema. Gastrointestinal system: Abdomen is nondistended, soft and nontender. No organomegaly or masses felt. Normal bowel sounds heard. Central nervous system: Alert and oriented.  Unsteady ataxic gait extremities: No edema. Skin: No rashes, lesions or ulcers Psychiatry: Judgement and insight appear normal. Mood & affect appropriate.     Data Reviewed: I have personally reviewed following labs and imaging studies  CBC: Recent  Labs  Lab 08/01/22 1309 08/02/22 0748 08/03/22 0217 08/07/22 0723  WBC  --  15.7* 12.4* 8.5  NEUTROABS  --   --   --  6.0  HGB 12.9* 13.1 11.2* 12.8*  HCT 38.0* 38.1* 33.8* 37.2*  MCV  --  87.8 89.4 88.8  PLT  --  148* 118* 539*   Basic Metabolic Panel: Recent Labs  Lab 08/01/22 1309 08/02/22 0748 08/03/22 0217 08/07/22 0723  NA 143 137 137 139  K 3.4* 3.2* 3.4* 3.4*  CL 104 104 106 106  CO2  --  '24 25 24  '$ GLUCOSE 109* 123* 99 94  BUN '10 16 19 11  '$ CREATININE 0.60* 0.80 0.84 0.69  CALCIUM  --  8.9 8.9 9.2  MG  --   --   --  2.0   GFR: Estimated Creatinine Clearance: 75.1 mL/min (by C-G formula based on SCr of 0.69 mg/dL). Liver Function Tests: Recent Labs  Lab 08/07/22 0723  AST 23  ALT 18  ALKPHOS 58  BILITOT 0.8  PROT 6.8  ALBUMIN 3.5   No results for input(s): "LIPASE", "AMYLASE" in the last 168 hours. No results for input(s): "AMMONIA" in the last 168 hours. Coagulation Profile: No results for input(s): "INR", "PROTIME" in the last 168 hours. Cardiac Enzymes: No results for input(s): "CKTOTAL", "CKMB", "CKMBINDEX", "TROPONINI" in the last 168 hours. BNP (last 3 results) No results for input(s): "PROBNP" in the last 8760 hours. HbA1C: Recent Labs    08/07/22 0723  HGBA1C 5.2   CBG: No results for input(s): "GLUCAP" in the last 168 hours. Lipid Profile: Recent Labs    08/07/22 0723 08/08/22 0300  CHOL 129 125  HDL 39* 37*  LDLCALC 78 75  TRIG 61 66  CHOLHDL 3.3 3.4   Thyroid Function Tests: No results for input(s): "TSH", "T4TOTAL", "FREET4", "T3FREE", "THYROIDAB" in the last 72 hours. Anemia Panel: No results for input(s): "VITAMINB12", "FOLATE", "FERRITIN", "TIBC", "IRON", "RETICCTPCT" in the last 72 hours. Sepsis Labs: No results for input(s): "PROCALCITON", "LATICACIDVEN" in the last 168 hours.  No results found for this or any previous visit (from the past 240 hour(s)).       Radiology Studies: CT ANGIO HEAD NECK W WO CM (CODE  STROKE)  Result Date: 08/07/2022 CLINICAL DATA:  Stroke, follow up Neuro deficit, acute, stroke suspected. Bilateral cerebellar infarcts. EXAM: CT ANGIOGRAPHY HEAD AND NECK TECHNIQUE: Multidetector CT imaging of the head and neck was performed using the standard protocol during bolus administration of intravenous contrast. Multiplanar CT image reconstructions and MIPs were obtained to evaluate the vascular anatomy. Carotid stenosis measurements (when applicable) are obtained utilizing NASCET criteria, using the distal internal carotid diameter as the denominator. RADIATION DOSE REDUCTION: This exam was performed according to the departmental dose-optimization program which includes automated exposure control, adjustment of the mA and/or kV according to patient size and/or use of iterative reconstruction technique. CONTRAST:  21m GADAVIST GADOBUTROL 1 MMOL/ML IV SOLN COMPARISON:  None Available. FINDINGS: CTA NECK  FINDINGS Aortic arch: Standard 3 vessel aortic arch with mild atherosclerotic plaque. No significant arch vessel origin stenosis. Right carotid system: Patent with a small amount of calcified and soft plaque at the carotid bifurcation and in the carotid bulb. No evidence of a significant stenosis or dissection. Tortuous distal cervical ICA. Left carotid system: Patent with a small amount of predominantly calcified plaque at the carotid bifurcation. No evidence of a significant stenosis or dissection. Vertebral arteries: Patent with the left being mildly to moderately dominant. Mild extrinsic narrowing of both V2 segments due to degenerative spurring in the cervical spine. Skeleton: Widespread advanced cervical disc degeneration. Advanced facet arthrosis. Interbody and left-sided facet ankylosis at C4-5. Other neck: No evidence of cervical lymphadenopathy or mass. Upper chest: No apical lung consolidation or mass. Review of the MIP images confirms the above findings CTA HEAD FINDINGS Anterior circulation:  The internal carotid arteries are patent from skull base to carotid termini with mild atherosclerosis bilaterally not resulting in significant stenosis. ACAs and MCAs are patent without evidence of a proximal branch occlusion or significant proximal stenosis. No aneurysm is identified. Posterior circulation: The intracranial vertebral arteries are patent to the basilar with mild atherosclerotic irregularity bilaterally but no significant stenosis. Patent PICA and SCA origins are seen bilaterally. The basilar artery is patent with mild atherosclerotic irregularity but no significant stenosis. Posterior communicating arteries are diminutive or absent. Both PCAs are patent without evidence of a significant proximal stenosis. No aneurysm is identified. Venous sinuses: Patent. Anatomic variants: None. Review of the MIP images confirms the above findings IMPRESSION: 1. Mild atherosclerosis in the head and neck without large vessel occlusion or high-grade proximal stenosis. 2. Mild extrinsic narrowing of both V2 segments due to cervical spondylosis. 3. Aortic Atherosclerosis (ICD10-I70.0). Electronically Signed   By: Logan Bores M.D.   On: 08/07/2022 15:08   ECHOCARDIOGRAM COMPLETE  Result Date: 08/07/2022    ECHOCARDIOGRAM REPORT   Patient Name:   Mark Zamora Date of Exam: 08/07/2022 Medical Rec #:  [id number]          Height:       64.0 in Accession #:    [id number]         Weight:       176.8 lb Date of Birth:  September 26, 1946         BSA:          1.857 m Patient Age:    80 years           BP:           145/86 mmHg Patient Gender: M                  HR:           66 bpm. Exam Location:  Inpatient Procedure: 2D Echo, Cardiac Doppler and Color Doppler Indications:    Stroke  History:        Patient has prior history of Echocardiogram examinations, most                 recent 08/02/2022. AAA, TAVR (08/01/22), COPD, Aortic Valve                 Disease; Risk Factors:Sleep Apnea, Former Smoker, Hypertension,                  Dyslipidemia and Alcohol.  Sonographer:    Eartha Inch Referring Phys: August Albino  Sonographer Comments: Technically difficult study due to poor echo windows.  Image acquisition challenging due to patient body habitus and Image acquisition challenging due to respiratory motion. IMPRESSIONS  1. Left ventricular ejection fraction, by estimation, is 55 to 60%. The left ventricle has normal function. The left ventricle has no regional wall motion abnormalities. Left ventricular diastolic parameters are consistent with Grade I diastolic dysfunction (impaired relaxation).  2. Right ventricular systolic function is normal. The right ventricular size is normal. Tricuspid regurgitation signal is inadequate for assessing PA pressure.  3. The mitral valve is normal in structure. No evidence of mitral valve regurgitation. No evidence of mitral stenosis.  4. 29 mm Sapien Valve without no PVL or evidence of prosthetic stenosis.. The aortic valve has been repaired/replaced. Aortic valve regurgitation is not visualized. No aortic stenosis is present.  5. The inferior vena cava is normal in size with greater than 50% respiratory variability, suggesting right atrial pressure of 3 mmHg. Comparison(s): No significant change from prior study. FINDINGS  Left Ventricle: Left ventricular ejection fraction, by estimation, is 55 to 60%. The left ventricle has normal function. The left ventricle has no regional wall motion abnormalities. The left ventricular internal cavity size was normal in size. Suboptimal image quality limits for assessment of left ventricular hypertrophy. Left ventricular diastolic parameters are consistent with Grade I diastolic dysfunction (impaired relaxation). Right Ventricle: The right ventricular size is normal. No increase in right ventricular wall thickness. Right ventricular systolic function is normal. Tricuspid regurgitation signal is inadequate for assessing PA pressure. Left Atrium: Left atrial  size was normal in size. Right Atrium: Right atrial size was normal in size. Pericardium: There is no evidence of pericardial effusion. Mitral Valve: The mitral valve is normal in structure. No evidence of mitral valve regurgitation. No evidence of mitral valve stenosis. Tricuspid Valve: The tricuspid valve is normal in structure. Tricuspid valve regurgitation is not demonstrated. No evidence of tricuspid stenosis. Aortic Valve: 29 mm Sapien Valve without no PVL or evidence of prosthetic stenosis. The aortic valve has been repaired/replaced. Aortic valve regurgitation is not visualized. No aortic stenosis is present. Aortic valve mean gradient measures 8.0 mmHg. Aortic valve peak gradient measures 12.8 mmHg. Aortic valve area, by VTI measures 2.56 cm. Pulmonic Valve: The pulmonic valve was normal in structure. Pulmonic valve regurgitation is not visualized. No evidence of pulmonic stenosis. Aorta: The aortic root is normal in size and structure. Venous: The inferior vena cava is normal in size with greater than 50% respiratory variability, suggesting right atrial pressure of 3 mmHg. IAS/Shunts: No atrial level shunt detected by color flow Doppler.  LEFT VENTRICLE PLAX 2D LVIDd:         4.80 cm   Diastology LVIDs:         3.40 cm   LV e' medial:    6.40 cm/s LV PW:         1.00 cm   LV E/e' medial:  9.2 LV IVS:        1.00 cm   LV e' lateral:   5.96 cm/s LVOT diam:     2.20 cm   LV E/e' lateral: 9.8 LV SV:         81 LV SV Index:   44 LVOT Area:     3.80 cm  RIGHT VENTRICLE             IVC RV S prime:     16.50 cm/s  IVC diam: 1.30 cm TAPSE (M-mode): 2.0 cm LEFT ATRIUM  Index        RIGHT ATRIUM           Index LA diam:        3.20 cm 1.72 cm/m   RA Area:     12.10 cm LA Vol (A2C):   49.3 ml 26.55 ml/m  RA Volume:   20.30 ml  10.93 ml/m LA Vol (A4C):   54.3 ml 29.25 ml/m LA Biplane Vol: 56.3 ml 30.33 ml/m  AORTIC VALVE AV Area (Vmax):    2.23 cm AV Area (Vmean):   2.14 cm AV Area (VTI):     2.56  cm AV Vmax:           179.00 cm/s AV Vmean:          136.000 cm/s AV VTI:            0.318 m AV Peak Grad:      12.8 mmHg AV Mean Grad:      8.0 mmHg LVOT Vmax:         105.00 cm/s LVOT Vmean:        76.400 cm/s LVOT VTI:          0.214 m LVOT/AV VTI ratio: 0.67  AORTA Ao Root diam: 2.90 cm MITRAL VALVE MV Area (PHT): 1.62 cm     SHUNTS MV Decel Time: 467 msec     Systemic VTI:  0.21 m MV E velocity: 58.60 cm/s   Systemic Diam: 2.20 cm MV A velocity: 113.00 cm/s MV E/A ratio:  0.52 Rudean Haskell MD Electronically signed by Rudean Haskell MD Signature Date/Time: 08/07/2022/12:33:34 PM    Final    CT HEAD WO CONTRAST (5MM)  Result Date: 08/07/2022 CLINICAL DATA:  Stroke follow-up EXAM: CT HEAD WITHOUT CONTRAST TECHNIQUE: Contiguous axial images were obtained from the base of the skull through the vertex without intravenous contrast. RADIATION DOSE REDUCTION: This exam was performed according to the departmental dose-optimization program which includes automated exposure control, adjustment of the mA and/or kV according to patient size and/or use of iterative reconstruction technique. COMPARISON:  Recent CT and MR imaging FINDINGS: Brain: Acute and chronic bilateral cerebellar infarcts are again identified. No obstructive hydrocephalus. No acute intracranial hemorrhage. Stable findings of parenchymal volume loss and probable chronic microvascular ischemic changes. No extra-axial collection. Vascular: There is atherosclerotic calcification at the skull base. Skull: Calvarium is unremarkable. Sinuses/Orbits: No acute finding. Other: Mastoid air cells are clear. IMPRESSION: Evolving recent bilateral cerebellar infarcts. No acute hemorrhage or worsening mass effect. Electronically Signed   By: Macy Mis M.D.   On: 08/07/2022 09:12   MR BRAIN W WO CONTRAST  Result Date: 08/07/2022 CLINICAL DATA:  Right eye vision changes following TAVR EXAM: MRI HEAD AND ORBITS WITHOUT AND WITH CONTRAST TECHNIQUE:  Multiplanar, multiecho pulse sequences of the brain and surrounding structures were obtained without and with intravenous contrast. Multiplanar, multiecho pulse sequences of the orbits and surrounding structures were obtained including fat saturation techniques, before and after intravenous contrast administration. CONTRAST:  85m GADAVIST GADOBUTROL 1 MMOL/ML IV SOLN COMPARISON:  No prior MRI, correlation is made with CT head 08/06/2022 FINDINGS: MRI HEAD FINDINGS Brain: Restricted diffusion with ADC correlate in the bilateral cerebellar hemispheres (series 5, 54-64), primarily affecting the right PICA and bilateral superior cerebellar artery territory, consistent with acute infarct. These areas demonstrate T2 hyperintense signal, consistent with edema, with gyral swelling and sulcal effacement. This causes local mass effect on the fourth ventricle, which is effaced. No definite hydrocephalus. Susceptibility in  the inferior right cerebellum (series 14, images 9-15), likely petechial hemorrhage. Some contrast enhancement is associated with the areas of infarction. Approximately 5 mm of right-to-left midline shift. No extra-axial collection. Confluent T2 hyperintense signal in the periventricular white matter and pons, likely the sequela of severe chronic small vessel ischemic disease. Remote left lateral cerebellar infarct. Dilated perivascular spaces in the basal ganglia and hippocampus. Vascular: Normal arterial flow voids. Skull and upper cervical spine: Normal marrow signal. Other: The mastoids are well aerated. MRI ORBITS FINDINGS Orbits: No traumatic or inflammatory finding. Globes, optic nerves, orbital fat, extraocular muscles, vascular structures, and lacrimal glands are normal. No abnormal enhancement. Status post bilateral lens replacements. Visualized sinuses: Clear. Soft tissues: Negative. IMPRESSION: 1. Acute infarcts in the bilateral cerebellar hemispheres, affecting the right PICA and bilateral  superior cerebellar artery territory. The inferior right cerebellar infarct is associated with petechial hemorrhage. 2. Local mass effect in the posterior fossa with sulcal effacement and effacement of the fourth ventricle, without evidence of resulting hydrocephalus. 3. No acute finding in the orbits. These results were called by telephone at the time of interpretation on 08/07/2022 at 1:34 am to provider MESSER, who verbally acknowledged these results. Electronically Signed   By: Merilyn Baba M.D.   On: 08/07/2022 01:35   MR ORBITS W WO CONTRAST  Result Date: 08/07/2022 CLINICAL DATA:  Right eye vision changes following TAVR EXAM: MRI HEAD AND ORBITS WITHOUT AND WITH CONTRAST TECHNIQUE: Multiplanar, multiecho pulse sequences of the brain and surrounding structures were obtained without and with intravenous contrast. Multiplanar, multiecho pulse sequences of the orbits and surrounding structures were obtained including fat saturation techniques, before and after intravenous contrast administration. CONTRAST:  46m GADAVIST GADOBUTROL 1 MMOL/ML IV SOLN COMPARISON:  No prior MRI, correlation is made with CT head 08/06/2022 FINDINGS: MRI HEAD FINDINGS Brain: Restricted diffusion with ADC correlate in the bilateral cerebellar hemispheres (series 5, 54-64), primarily affecting the right PICA and bilateral superior cerebellar artery territory, consistent with acute infarct. These areas demonstrate T2 hyperintense signal, consistent with edema, with gyral swelling and sulcal effacement. This causes local mass effect on the fourth ventricle, which is effaced. No definite hydrocephalus. Susceptibility in the inferior right cerebellum (series 14, images 9-15), likely petechial hemorrhage. Some contrast enhancement is associated with the areas of infarction. Approximately 5 mm of right-to-left midline shift. No extra-axial collection. Confluent T2 hyperintense signal in the periventricular white matter and pons, likely the  sequela of severe chronic small vessel ischemic disease. Remote left lateral cerebellar infarct. Dilated perivascular spaces in the basal ganglia and hippocampus. Vascular: Normal arterial flow voids. Skull and upper cervical spine: Normal marrow signal. Other: The mastoids are well aerated. MRI ORBITS FINDINGS Orbits: No traumatic or inflammatory finding. Globes, optic nerves, orbital fat, extraocular muscles, vascular structures, and lacrimal glands are normal. No abnormal enhancement. Status post bilateral lens replacements. Visualized sinuses: Clear. Soft tissues: Negative. IMPRESSION: 1. 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 The inferior right cerebellar infarct is associated with petechial hemorrhage. 2. Local mass effect in the posterior fossa with sulcal effacement and effacement of the fourth ventricle, without evidence of resulting hydrocephalus. 3. No acute finding in the orbits. These results were called by telephone at the time of interpretation on 08/07/2022 at 1:34 am to provider MESSER, who verbally acknowledged these results. Electronically Signed   By: AMerilyn BabaM.D.   On: 08/07/2022 01:35  Scheduled Meds:  aspirin  81 mg Oral Daily   clopidogrel  75 mg Oral Daily   docusate sodium  100 mg Oral BID   famotidine  40 mg Oral QPM   melatonin  10 mg Oral QHS   mometasone-formoterol  2 puff Inhalation BID   pantoprazole  40 mg Oral Daily   polyethylene glycol  17 g Oral Daily   pravastatin  80 mg Oral QPM   Continuous Infusions:   LOS: 1 day    Time spent: [place]  Georgette Shell, MD  08/08/2022, 12:39 PM

## 2022-08-08 NOTE — Plan of Care (Signed)
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
## 2022-08-08 NOTE — Progress Notes (Addendum)
STROKE TEAM PROGRESS NOTE   INTERVAL HISTORY No family at the bedside. Patient is awake and alert in NAD. He is oriented x 4, no aphasia or dysarthria. Still with vision problems in left eye. Strength equal bilaterally, no ataxia, sensation symmetric.  Repeat CT head with Evolving recent bilateral cerebellar infarcts. No acute hemorrhage or worsening mass effect. CTA head and neck with Mild atherosclerosis in the head and neck without large vessel occlusion or high-grade proximal stenosis;  Mild extrinsic narrowing of both V2 segments due to cervical spondylosis;  Aortic Atherosclerosis.    Vitals:   08/07/22 2240 08/07/22 2345 08/08/22 0400 08/08/22 0845  BP:  (!) 164/89 (!) 162/72 (!) 169/84  Pulse: 88 60 69 61  Resp: '20 18 20 14  '$ Temp:  98.6 F (37 C) 98.3 F (36.8 C) 97.7 F (36.5 C)  TempSrc:  Oral Oral Oral  SpO2: 96% 96% 94% 95%  Weight:      Height:       CBC:  Recent Labs  Lab 08/03/22 0217 08/07/22 0723  WBC 12.4* 8.5  NEUTROABS  --  6.0  HGB 11.2* 12.8*  HCT 33.8* 37.2*  MCV 89.4 88.8  PLT 118* 142*    Basic Metabolic Panel:  Recent Labs  Lab 08/03/22 0217 08/07/22 0723  NA 137 139  K 3.4* 3.4*  CL 106 106  CO2 25 24  GLUCOSE 99 94  BUN 19 11  CREATININE 0.84 0.69  CALCIUM 8.9 9.2  MG  --  2.0    Lipid Panel:  Recent Labs  Lab 08/08/22 0300  CHOL 125  TRIG 66  HDL 37*  CHOLHDL 3.4  VLDL 13  LDLCALC 75    HgbA1c:  Recent Labs  Lab 08/07/22 0723  HGBA1C 5.2    Urine Drug Screen: No results for input(s): "LABOPIA", "COCAINSCRNUR", "LABBENZ", "AMPHETMU", "THCU", "LABBARB" in the last 168 hours.  Alcohol Level No results for input(s): "ETH" in the last 168 hours.  IMAGING past 24 hours CT ANGIO HEAD NECK W WO CM (CODE STROKE)  Result Date: 08/07/2022 CLINICAL DATA:  Stroke, follow up Neuro deficit, acute, stroke suspected. Bilateral cerebellar infarcts. EXAM: CT ANGIOGRAPHY HEAD AND NECK TECHNIQUE: Multidetector CT imaging of the head  and neck was performed using the standard protocol during bolus administration of intravenous contrast. Multiplanar CT image reconstructions and MIPs were obtained to evaluate the vascular anatomy. Carotid stenosis measurements (when applicable) are obtained utilizing NASCET criteria, using the distal internal carotid diameter as the denominator. RADIATION DOSE REDUCTION: This exam was performed according to the departmental dose-optimization program which includes automated exposure control, adjustment of the mA and/or kV according to patient size and/or use of iterative reconstruction technique. CONTRAST:  40m GADAVIST GADOBUTROL 1 MMOL/ML IV SOLN COMPARISON:  None Available. FINDINGS: CTA NECK FINDINGS Aortic arch: Standard 3 vessel aortic arch with mild atherosclerotic plaque. No significant arch vessel origin stenosis. Right carotid system: Patent with a small amount of calcified and soft plaque at the carotid bifurcation and in the carotid bulb. No evidence of a significant stenosis or dissection. Tortuous distal cervical ICA. Left carotid system: Patent with a small amount of predominantly calcified plaque at the carotid bifurcation. No evidence of a significant stenosis or dissection. Vertebral arteries: Patent with the left being mildly to moderately dominant. Mild extrinsic narrowing of both V2 segments due to degenerative spurring in the cervical spine. Skeleton: Widespread advanced cervical disc degeneration. Advanced facet arthrosis. Interbody and left-sided facet ankylosis at C4-5. Other neck:  No evidence of cervical lymphadenopathy or mass. Upper chest: No apical lung consolidation or mass. Review of the MIP images confirms the above findings CTA HEAD FINDINGS Anterior circulation: The internal carotid arteries are patent from skull base to carotid termini with mild atherosclerosis bilaterally not resulting in significant stenosis. ACAs and MCAs are patent without evidence of a proximal branch  occlusion or significant proximal stenosis. No aneurysm is identified. Posterior circulation: The intracranial vertebral arteries are patent to the basilar with mild atherosclerotic irregularity bilaterally but no significant stenosis. Patent PICA and SCA origins are seen bilaterally. The basilar artery is patent with mild atherosclerotic irregularity but no significant stenosis. Posterior communicating arteries are diminutive or absent. Both PCAs are patent without evidence of a significant proximal stenosis. No aneurysm is identified. Venous sinuses: Patent. Anatomic variants: None. Review of the MIP images confirms the above findings IMPRESSION: 1. Mild atherosclerosis in the head and neck without large vessel occlusion or high-grade proximal stenosis. 2. Mild extrinsic narrowing of both V2 segments due to cervical spondylosis. 3. Aortic Atherosclerosis (ICD10-I70.0). Electronically Signed   By: Logan Bores M.D.   On: 08/07/2022 15:08    PHYSICAL EXAM  Temp:  [97.7 F (36.5 C)-99 F (37.2 C)] 97.7 F (36.5 C) (09/12 0845) Pulse Rate:  [60-88] 61 (09/12 0845) Resp:  [14-22] 14 (09/12 0845) BP: (138-169)/(72-93) 169/84 (09/12 0845) SpO2:  [94 %-99 %] 95 % (09/12 0845)  General - Well nourished, well developed, in no apparent distress. Cardiovascular - Regular rhythm and rate.  Mental Status -  Level of arousal and orientation to time, place, and person were intact. Language including expression, naming, repetition, comprehension was assessed and found intact.   Cranial Nerves II - XII - II - Visual field intact OU. III, IV, VI - Extraocular movements intact. V - Facial sensation intact bilaterally. VII - Facial movement intact bilaterally. VIII - Hearing & vestibular intact bilaterally. X - Palate elevates symmetrically. XI - Chin turning & shoulder shrug intact bilaterally. XII - Tongue protrusion intact.  Motor Strength - The patient's strength was normal in all extremities and  pronator drift was absent.  Bulk was normal and fasciculations were absent.   Motor Tone - Muscle tone was assessed at the neck and appendages and was normal.  Sensory - Light touch, temperature/pinprick were assessed and were symmetrical.    Coordination - mild ataxia on the right   Gait and Station - deferred.   ASSESSMENT/PLAN Mark Zamora is a 76 y.o. male with history of severe aortic stenosis status post TAVR on 08/01/2022, COPD, essential hypertension, hyperlipidemia, obstructive sleep apnea on nocturnal CPAP, who is admitted to [facility] on 08/06/2022 with acute ischemic stroke following a ED to ED transfer from [facility] ED to [facility] ED after presenting to the former facility complaining change in vision in right eye. No TEE needed per cardiology.   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 No acute hemorrhage or worsening mass effect.  CTA head & neck pending  MRI   1. 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 The inferior right cerebellar infarct is associated with petechial hemorrhage. 2. Local mass effect in the posterior fossa with sulcal effacement and effacement of the fourth ventricle, without evidence of resulting hydrocephalus. 3. No  acute finding in the orbits.  2D Echo EF 81-19%, Grade 1 diastolic dysfunction  LDL No results found for requested labs within last 1095 days. HgbA1c 5.2 VTE prophylaxis - SCD's    Diet   Diet Heart Room service appropriate? Yes; Fluid consistency: Thin   aspirin 81 mg daily prior to admission, now on aspirin 81 mg daily.  Therapy recommendations:  pending Disposition:  pending  Hypertension Home meds:  norvasc, lisinopril Stable Permissive  hypertension (OK if < 220/120) but gradually normalize in 5-7 days Long-term BP goal normotensive  Hyperlipidemia Home meds:  pravastatin,  resumed in hospital LDL 78, goal < 70 Continue statin at discharge   Other Stroke Risk Factors Advanced Age >/= 74  Former Cigarette smoker CHF Coronary artery disease Obstructive sleep apnea, on CPAP at home  Other Active Problems GERD BPH COPD  Hospital day # 1  Patient seen and examined by NP/APP with MD. MD to update note as needed.   Janine Ores, DNP, FNP-BC [facility] Pager: [phone]  ATTENDING ATTESTATION:  76 year old with severe aortic stenosis status post TAVR with new bilateral PICA and SCA stroke with petechial hemorrhage.  Aspirin and Plavix for 3 weeks then aspirin '3 2 5 '$ mg alone.  Cardiology does not recommend TEE.  CTA is negative Is important follow-up in stroke clinic after discharge.  Switch statin to crestor since LDL not at goal.  Neurology will sign off please call with questions.  Dr. Reeves Forth evaluated pt independently, reviewed imaging, chart, labs. Discussed and formulated plan with the Resident/APP. Changes were made to the note where appropriate. Please see APP/resident note above for details.       Mark Alfred,MD   To contact Stroke Continuity provider, please refer to [email or web address] After hours, contact General Neurology

## 2022-08-08 NOTE — Progress Notes (Signed)
Placed patient on CPAP for the night with pressure set at 8cm  

## 2022-08-08 NOTE — Progress Notes (Signed)
Rounding Note    Patient Name: Mark Zamora Date of Encounter: 08/08/2022  [place] Cardiologist: Mark Osmond, MD   Subjective   No chest pain or dyspnea. Still with blurry vision right eye  Inpatient Medications    Scheduled Meds:  aspirin  81 mg Oral Daily   clopidogrel  75 mg Oral Daily   docusate sodium  100 mg Oral BID   famotidine  40 mg Oral QPM   melatonin  10 mg Oral QHS   mometasone-formoterol  2 puff Inhalation BID   pantoprazole  40 mg Oral Daily   pravastatin  80 mg Oral QPM   Continuous Infusions:  PRN Meds: acetaminophen **OR** acetaminophen, hydrALAZINE, zolpidem   Vital Signs    Vitals:   08/07/22 2240 08/07/22 2345 08/08/22 0400 08/08/22 0845  BP:  (!) 164/89 (!) 162/72 (!) 169/84  Pulse: 88 60 69 61  Resp: '20 18 20 14  '$ Temp:  98.6 F (37 C) 98.3 F (36.8 C) 97.7 F (36.5 C)  TempSrc:  Oral Oral Oral  SpO2: 96% 96% 94% 95%  Weight:      Height:       No intake or output data in the 24 hours ending 08/08/22 0923    08/06/2022    6:34 PM 08/03/2022    5:45 AM 08/01/2022    8:39 AM  Last 3 Weights  Weight (lbs) 176 lb 12.9 oz 176 lb 12.8 oz 175 lb  Weight (kg) 80.2 kg 80.196 kg 79.379 kg      Telemetry    Sinus - Personally Reviewed  ECG    No AM eKG - Personally Reviewed  Physical Exam   General: Well developed, well nourished, NAD  HEENT: OP clear, mucus membranes moist  SKIN: warm, dry.  Musculoskeletal: Muscle strength 5/5 all ext  Psychiatric: Mood and affect normal  Neck: No JVD Lungs:Clear bilaterally, no wheezes, rhonci, crackles Cardiovascular: Regular rate and rhythm. No murmurs, gallops or rubs. Abdomen:Soft.  Extremities: No lower extremity edema.    Labs    High Sensitivity Troponin:  No results for input(s): "TROPONINIHS" in the last 720 hours.   Chemistry Recent Labs  Lab 08/02/22 0748 08/03/22 0217 08/07/22 0723  NA 137 137 139  K 3.2* 3.4* 3.4*  CL 104 106 106  CO2 '24 25 24   '$ GLUCOSE 123* 99 94  BUN '16 19 11  '$ CREATININE 0.80 0.84 0.69  CALCIUM 8.9 8.9 9.2  MG  --   --  2.0  PROT  --   --  6.8  ALBUMIN  --   --  3.5  AST  --   --  23  ALT  --   --  18  ALKPHOS  --   --  58  BILITOT  --   --  0.8  GFRNONAA >60 >60 >60  ANIONGAP '9 6 9    '$ Lipids  Recent Labs  Lab 08/08/22 0300  CHOL 125  TRIG 66  HDL 37*  LDLCALC 75  CHOLHDL 3.4    Hematology Recent Labs  Lab 08/02/22 0748 08/03/22 0217 08/07/22 0723  WBC 15.7* 12.4* 8.5  RBC 4.34 3.78* 4.19*  HGB 13.1 11.2* 12.8*  HCT 38.1* 33.8* 37.2*  MCV 87.8 89.4 88.8  MCH 30.2 29.6 30.5  MCHC 34.4 33.1 34.4  RDW 14.0 14.4 13.8  PLT 148* 118* 142*   Thyroid No results for input(s): "TSH", "FREET4" in the last 168 hours.  BNPNo results for  input(s): "BNP", "PROBNP" in the last 168 hours.  DDimer No results for input(s): "DDIMER" in the last 168 hours.   Radiology    CT ANGIO HEAD NECK W WO CM (CODE STROKE)  Result Date: 08/07/2022 CLINICAL DATA:  Stroke, follow up Neuro deficit, acute, stroke suspected. Bilateral cerebellar infarcts. EXAM: CT ANGIOGRAPHY HEAD AND NECK TECHNIQUE: Multidetector CT imaging of the head and neck was performed using the standard protocol during bolus administration of intravenous contrast. Multiplanar CT image reconstructions and MIPs were obtained to evaluate the vascular anatomy. Carotid stenosis measurements (when applicable) are obtained utilizing NASCET criteria, using the distal internal carotid diameter as the denominator. RADIATION DOSE REDUCTION: This exam was performed according to the departmental dose-optimization program which includes automated exposure control, adjustment of the mA and/or kV according to patient size and/or use of iterative reconstruction technique. CONTRAST:  51m GADAVIST GADOBUTROL 1 MMOL/ML IV SOLN COMPARISON:  None Available. FINDINGS: CTA NECK FINDINGS Aortic arch: Standard 3 vessel aortic arch with mild atherosclerotic plaque. No  significant arch vessel origin stenosis. Right carotid system: Patent with a small amount of calcified and soft plaque at the carotid bifurcation and in the carotid bulb. No evidence of a significant stenosis or dissection. Tortuous distal cervical ICA. Left carotid system: Patent with a small amount of predominantly calcified plaque at the carotid bifurcation. No evidence of a significant stenosis or dissection. Vertebral arteries: Patent with the left being mildly to moderately dominant. Mild extrinsic narrowing of both V2 segments due to degenerative spurring in the cervical spine. Skeleton: Widespread advanced cervical disc degeneration. Advanced facet arthrosis. Interbody and left-sided facet ankylosis at C4-5. Other neck: No evidence of cervical lymphadenopathy or mass. Upper chest: No apical lung consolidation or mass. Review of the MIP images confirms the above findings CTA HEAD FINDINGS Anterior circulation: The internal carotid arteries are patent from skull base to carotid termini with mild atherosclerosis bilaterally not resulting in significant stenosis. ACAs and MCAs are patent without evidence of a proximal branch occlusion or significant proximal stenosis. No aneurysm is identified. Posterior circulation: The intracranial vertebral arteries are patent to the basilar with mild atherosclerotic irregularity bilaterally but no significant stenosis. Patent PICA and SCA origins are seen bilaterally. The basilar artery is patent with mild atherosclerotic irregularity but no significant stenosis. Posterior communicating arteries are diminutive or absent. Both PCAs are patent without evidence of a significant proximal stenosis. No aneurysm is identified. Venous sinuses: Patent. Anatomic variants: None. Review of the MIP images confirms the above findings IMPRESSION: 1. Mild atherosclerosis in the head and neck without large vessel occlusion or high-grade proximal stenosis. 2. Mild extrinsic narrowing of both  V2 segments due to cervical spondylosis. 3. Aortic Atherosclerosis (ICD10-I70.0). Electronically Signed   By: ALogan BoresM.D.   On: 08/07/2022 15:08   ECHOCARDIOGRAM COMPLETE  Result Date: 08/07/2022    ECHOCARDIOGRAM REPORT   Patient Name:   LEben BurowDate of Exam: 08/07/2022 Medical Rec #:  [id number]         Height:       64.0 in Accession #:    [id number]        Weight:       176.8 lb Date of Birth:  [birth year]        BSA:          1.857 m Patient Age:    89years           BP:  145/86 mmHg Patient Gender: M                  HR:           66 bpm. Exam Location:  Inpatient Procedure: 2D Echo, Cardiac Doppler and Color Doppler Indications:    Stroke  History:        Patient has prior history of Echocardiogram examinations, most                 recent 08/02/2022. AAA, TAVR (08/01/22), COPD, Aortic Valve                 Disease; Risk Factors:Sleep Apnea, Former Smoker, Hypertension,                 Dyslipidemia and Alcohol.  Sonographer:    Eartha Inch Referring Phys: August Albino  Sonographer Comments: Technically difficult study due to poor echo windows. Image acquisition challenging due to patient body habitus and Image acquisition challenging due to respiratory motion. IMPRESSIONS  1. Left ventricular ejection fraction, by estimation, is 55 to 60%. The left ventricle has normal function. The left ventricle has no regional wall motion abnormalities. Left ventricular diastolic parameters are consistent with Grade I diastolic dysfunction (impaired relaxation).  2. Right ventricular systolic function is normal. The right ventricular size is normal. Tricuspid regurgitation signal is inadequate for assessing PA pressure.  3. The mitral valve is normal in structure. No evidence of mitral valve regurgitation. No evidence of mitral stenosis.  4. 29 mm Sapien Valve without no PVL or evidence of prosthetic stenosis.. The aortic valve has been repaired/replaced. Aortic valve regurgitation is  not visualized. No aortic stenosis is present.  5. The inferior vena cava is normal in size with greater than 50% respiratory variability, suggesting right atrial pressure of 3 mmHg. Comparison(s): No significant change from prior study. FINDINGS  Left Ventricle: Left ventricular ejection fraction, by estimation, is 55 to 60%. The left ventricle has normal function. The left ventricle has no regional wall motion abnormalities. The left ventricular internal cavity size was normal in size. Suboptimal image quality limits for assessment of left ventricular hypertrophy. Left ventricular diastolic parameters are consistent with Grade I diastolic dysfunction (impaired relaxation). Right Ventricle: The right ventricular size is normal. No increase in right ventricular wall thickness. Right ventricular systolic function is normal. Tricuspid regurgitation signal is inadequate for assessing PA pressure. Left Atrium: Left atrial size was normal in size. Right Atrium: Right atrial size was normal in size. Pericardium: There is no evidence of pericardial effusion. Mitral Valve: The mitral valve is normal in structure. No evidence of mitral valve regurgitation. No evidence of mitral valve stenosis. Tricuspid Valve: The tricuspid valve is normal in structure. Tricuspid valve regurgitation is not demonstrated. No evidence of tricuspid stenosis. Aortic Valve: 29 mm Sapien Valve without no PVL or evidence of prosthetic stenosis. The aortic valve has been repaired/replaced. Aortic valve regurgitation is not visualized. No aortic stenosis is present. Aortic valve mean gradient measures 8.0 mmHg. Aortic valve peak gradient measures 12.8 mmHg. Aortic valve area, by VTI measures 2.56 cm. Pulmonic Valve: The pulmonic valve was normal in structure. Pulmonic valve regurgitation is not visualized. No evidence of pulmonic stenosis. Aorta: The aortic root is normal in size and structure. Venous: The inferior vena cava is normal in size with  greater than 50% respiratory variability, suggesting right atrial pressure of 3 mmHg. IAS/Shunts: No atrial level shunt detected by color flow Doppler.  LEFT VENTRICLE PLAX 2D  LVIDd:         4.80 cm   Diastology LVIDs:         3.40 cm   LV e' medial:    6.40 cm/s LV PW:         1.00 cm   LV E/e' medial:  9.2 LV IVS:        1.00 cm   LV e' lateral:   5.96 cm/s LVOT diam:     2.20 cm   LV E/e' lateral: 9.8 LV SV:         81 LV SV Index:   44 LVOT Area:     3.80 cm  RIGHT VENTRICLE             IVC RV S prime:     16.50 cm/s  IVC diam: 1.30 cm TAPSE (M-mode): 2.0 cm LEFT ATRIUM             Index        RIGHT ATRIUM           Index LA diam:        3.20 cm 1.72 cm/m   RA Area:     12.10 cm LA Vol (A2C):   49.3 ml 26.55 ml/m  RA Volume:   20.30 ml  10.93 ml/m LA Vol (A4C):   54.3 ml 29.25 ml/m LA Biplane Vol: 56.3 ml 30.33 ml/m  AORTIC VALVE AV Area (Vmax):    2.23 cm AV Area (Vmean):   2.14 cm AV Area (VTI):     2.56 cm AV Vmax:           179.00 cm/s AV Vmean:          136.000 cm/s AV VTI:            0.318 m AV Peak Grad:      12.8 mmHg AV Mean Grad:      8.0 mmHg LVOT Vmax:         105.00 cm/s LVOT Vmean:        76.400 cm/s LVOT VTI:          0.214 m LVOT/AV VTI ratio: 0.67  AORTA Ao Root diam: 2.90 cm MITRAL VALVE MV Area (PHT): 1.62 cm     SHUNTS MV Decel Time: 467 msec     Systemic VTI:  0.21 m MV E velocity: 58.60 cm/s   Systemic Diam: 2.20 cm MV A velocity: 113.00 cm/s MV E/A ratio:  0.52 Rudean Haskell MD Electronically signed by Rudean Haskell MD Signature Date/Time: 08/07/2022/12:33:34 PM    Final    CT HEAD WO CONTRAST (5MM)  Result Date: 08/07/2022 CLINICAL DATA:  Stroke follow-up EXAM: CT HEAD WITHOUT CONTRAST TECHNIQUE: Contiguous axial images were obtained from the base of the skull through the vertex without intravenous contrast. RADIATION DOSE REDUCTION: This exam was performed according to the departmental dose-optimization program which includes automated exposure control,  adjustment of the mA and/or kV according to patient size and/or use of iterative reconstruction technique. COMPARISON:  Recent CT and MR imaging FINDINGS: Brain: Acute and chronic bilateral cerebellar infarcts are again identified. No obstructive hydrocephalus. No acute intracranial hemorrhage. Stable findings of parenchymal volume loss and probable chronic microvascular ischemic changes. No extra-axial collection. Vascular: There is atherosclerotic calcification at the skull base. Skull: Calvarium is unremarkable. Sinuses/Orbits: No acute finding. Other: Mastoid air cells are clear. IMPRESSION: Evolving recent bilateral cerebellar infarcts. No acute hemorrhage or worsening mass effect. Electronically Signed   By: Addison Lank.D.  On: 08/07/2022 09:12   MR BRAIN W WO CONTRAST  Result Date: 08/07/2022 CLINICAL DATA:  Right eye vision changes following TAVR EXAM: MRI HEAD AND ORBITS WITHOUT AND WITH CONTRAST TECHNIQUE: Multiplanar, multiecho pulse sequences of the brain and surrounding structures were obtained without and with intravenous contrast. Multiplanar, multiecho pulse sequences of the orbits and surrounding structures were obtained including fat saturation techniques, before and after intravenous contrast administration. CONTRAST:  42m GADAVIST GADOBUTROL 1 MMOL/ML IV SOLN COMPARISON:  No prior MRI, correlation is made with CT head 08/06/2022 FINDINGS: MRI HEAD FINDINGS Brain: Restricted diffusion with ADC correlate in the bilateral cerebellar hemispheres (series 5, 54-64), primarily affecting the right PICA and bilateral superior cerebellar artery territory, consistent with acute infarct. These areas demonstrate T2 hyperintense signal, consistent with edema, with gyral swelling and sulcal effacement. This causes local mass effect on the fourth ventricle, which is effaced. No definite hydrocephalus. Susceptibility in the inferior right cerebellum (series 14, images 9-15), likely petechial hemorrhage.  Some contrast enhancement is associated with the areas of infarction. Approximately 5 mm of right-to-left midline shift. No extra-axial collection. Confluent T2 hyperintense signal in the periventricular white matter and pons, likely the sequela of severe chronic small vessel ischemic disease. Remote left lateral cerebellar infarct. Dilated perivascular spaces in the basal ganglia and hippocampus. Vascular: Normal arterial flow voids. Skull and upper cervical spine: Normal marrow signal. Other: The mastoids are well aerated. MRI ORBITS FINDINGS Orbits: No traumatic or inflammatory finding. Globes, optic nerves, orbital fat, extraocular muscles, vascular structures, and lacrimal glands are normal. No abnormal enhancement. Status post bilateral lens replacements. Visualized sinuses: Clear. Soft tissues: Negative. IMPRESSION: 1. 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 The inferior right cerebellar infarct is associated with petechial hemorrhage. 2. Local mass effect in the posterior fossa with sulcal effacement and effacement of the fourth ventricle, without evidence of resulting hydrocephalus. 3. No acute finding in the orbits. These results were called by telephone at the time of interpretation on 08/07/2022 at 1:34 am to provider MESSER, who verbally acknowledged these results. Electronically Signed   By: AMerilyn BabaM.D.   On: 08/07/2022 01:35   MR ORBITS W WO CONTRAST  Result Date: 08/07/2022 CLINICAL DATA:  Right eye vision changes following TAVR EXAM: MRI HEAD AND ORBITS WITHOUT AND WITH CONTRAST TECHNIQUE: Multiplanar, multiecho pulse sequences of the brain and surrounding structures were obtained without and with intravenous contrast. Multiplanar, multiecho pulse sequences of the orbits and surrounding structures were obtained including fat saturation techniques, before and after intravenous contrast  administration. CONTRAST:  858mGADAVIST GADOBUTROL 1 MMOL/ML IV SOLN COMPARISON:  No prior MRI, correlation is made with CT head 08/06/2022 FINDINGS: MRI HEAD FINDINGS Brain: Restricted diffusion with ADC correlate in the bilateral cerebellar hemispheres (series 5, 54-64), primarily affecting the right PICA and bilateral superior cerebellar artery territory, consistent with acute infarct. These areas demonstrate T2 hyperintense signal, consistent with edema, with gyral swelling and sulcal effacement. This causes local mass effect on the fourth ventricle, which is effaced. No definite hydrocephalus. Susceptibility in the inferior right cerebellum (series 14, images 9-15), likely petechial hemorrhage. Some contrast enhancement is associated with the areas of infarction. Approximately 5 mm of right-to-left midline shift. No extra-axial collection. Confluent T2 hyperintense signal in the periventricular white matter and pons, likely the sequela of severe chronic small vessel ischemic disease. Remote left lateral cerebellar infarct. Dilated perivascular spaces in the basal ganglia and hippocampus. Vascular:  Normal arterial flow voids. Skull and upper cervical spine: Normal marrow signal. Other: The mastoids are well aerated. MRI ORBITS FINDINGS Orbits: No traumatic or inflammatory finding. Globes, optic nerves, orbital fat, extraocular muscles, vascular structures, and lacrimal glands are normal. No abnormal enhancement. Status post bilateral lens replacements. Visualized sinuses: Clear. Soft tissues: Negative. IMPRESSION: 1. 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 The inferior right cerebellar infarct is associated with petechial hemorrhage. 2. Local mass effect in the posterior fossa with sulcal effacement and effacement of the fourth ventricle, without evidence of resulting hydrocephalus. 3. No acute finding in the orbits.  These results were called by telephone at the time of interpretation on 08/07/2022 at 1:34 am to provider MESSER, who verbally acknowledged these results. Electronically Signed   By: Merilyn Baba M.D.   On: 08/07/2022 01:35    Cardiac Studies     Patient Profile     76 y.o. 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 No focal motor deficits in arms/legs.  Assessment & Plan    CVA post TAVR placement: Most likely embolic event following TAVR with source from aortic arch manipulation or from the old aortic valve. I do not think a TEE is indicated in stroke workup for this patient as he has no history of atrial fibrillation and had no evidence of atrial clot on his pre TAVR imaging. Agree with ASA and Plavix. Will sign off. We have follow up arranged in our [facility].   For questions or updates, please contact [place] Please consult [email or web address] for contact info under       Signed, Lauree Chandler, MD , [facility] 08/08/2022, 9:23 AM

## 2022-08-08 NOTE — Progress Notes (Signed)
CSW received request for resources to help find a senior living environment for patient. CSW sent referral to [employer] to connect with patient. Info placed on AVS for follow up.   Gilmore Laroche, MSW, [facility]

## 2022-08-09 ENCOUNTER — Encounter (HOSPITAL_COMMUNITY)
Admission: EM | Disposition: A | Discharge status: Home Health | Payer: Self-pay | Source: Home / Self Care | Attending: Internal Medicine

## 2022-08-09 ENCOUNTER — Ambulatory Visit: Payer: Medicare Other

## 2022-08-09 ENCOUNTER — Telehealth (HOSPITAL_COMMUNITY): Payer: Self-pay | Admitting: *Deleted

## 2022-08-09 DIAGNOSIS — K219 Gastro-esophageal reflux disease without esophagitis: Secondary | ICD-10-CM | POA: Diagnosis not present

## 2022-08-09 DIAGNOSIS — I639 Cerebral infarction, unspecified: Secondary | ICD-10-CM | POA: Diagnosis not present

## 2022-08-09 DIAGNOSIS — I10 Essential (primary) hypertension: Secondary | ICD-10-CM | POA: Diagnosis not present

## 2022-08-09 DIAGNOSIS — J449 Chronic obstructive pulmonary disease, unspecified: Secondary | ICD-10-CM | POA: Diagnosis not present

## 2022-08-09 LAB — BASIC METABOLIC PANEL
Anion gap: 7 (ref 5–15)
BUN: 17 mg/dL (ref 8–23)
CO2: 26 mmol/L (ref 22–32)
Calcium: 9.3 mg/dL (ref 8.9–10.3)
Chloride: 105 mmol/L (ref 98–111)
Creatinine, Ser: 0.75 mg/dL (ref 0.61–1.24)
GFR, Estimated: >60 mL/min (ref >60)
Glucose, Bld: 98 mg/dL (ref 70–99)
Potassium: 3.5 mmol/L (ref 3.5–5.1)
Sodium: 138 mmol/L (ref 135–145)

## 2022-08-09 LAB — CBC
HCT: 36.6 % — ABNORMAL LOW (ref 39.0–52.0)
Hemoglobin: 12.4 g/dL — ABNORMAL LOW (ref 13.0–17.0)
MCH: 30 pg (ref 26.0–34.0)
MCHC: 33.9 g/dL (ref 30.0–36.0)
MCV: 88.6 fL (ref 80.0–100.0)
Platelets: 169 10*3/uL (ref 150–400)
RBC: 4.13 MIL/uL — ABNORMAL LOW (ref 4.22–5.81)
RDW: 13.8 % (ref 11.5–15.5)
WBC: 8.3 10*3/uL (ref 4.0–10.5)
nRBC: 0 % (ref 0.0–0.2)

## 2022-08-09 SURGERY — ECHOCARDIOGRAM, TRANSESOPHAGEAL
Anesthesia: Monitor Anesthesia Care

## 2022-08-09 MED ORDER — BISACODYL 10 MG RE SUPP
10.0000 mg | Freq: Once | RECTAL | Status: AC
Start: 2022-08-09 — End: 2022-08-09
  Administered 2022-08-09: 10 mg via RECTAL
  Filled 2022-08-09: qty 1

## 2022-08-09 MED ORDER — ZOLPIDEM TARTRATE 5 MG PO TABS
5.0000 mg | ORAL_TABLET | Freq: Once | ORAL | Status: AC
  Administered 2022-08-09: 5 mg via ORAL
  Filled 2022-08-09: qty 1

## 2022-08-09 NOTE — Telephone Encounter (Signed)
Referral for Cardiac Rehab phase II sent to [facility] per pt request. Maurice Small RN, BSN Cardiac and Pulmonary Rehab Nurse Navigator

## 2022-08-09 NOTE — Progress Notes (Signed)
PROGRESS NOTE    Mark Zamora  ZWC:[id number] DOB: [birth year] DOA: 08/06/2022 PCP: Maury Dus, MD    Chief Complaint  Patient presents with   Eye Problem    Brief Narrative:  HPI per Dr. [name] is a 76 y.o. male with medical history significant for severe aortic stenosis status post TAVR on 08/01/2022, COPD, essential hypertension, hyperlipidemia, obstructive sleep apnea on nocturnal CPAP, who is admitted to [facility] on 08/06/2022 with acute ischemic stroke following a ED to ED transfer from [facility] ED to [facility] ED after presenting to the former facility complaining change in vision in right eye.    In the setting of severe aortic stenosis, the patient underwent TAVR at [facility] on 08/01/2022, and was discharged home on 08/03/2022.  The patient reports that he awoke on the morning of Friday, 08/04/2022, in his normal state of health, without any acute focal neurologic deficits or visual field changes at that time.  However, starting around 10 AM on 08/04/2022, as the patient reports acute onset change in his vision in the right eye, noting the appearance of dark-colored spots in all 4 quadrants of the right eye, while denying any visual changes in the left eye.  He notes that these changes in vision have persisted and are still present at this time.  No recent preceding trauma.   Denies any associated acute focal weakness, acute focal numbness, paresthesias, dysphagia, dizziness, vertigo, nausea, vomiting, word finding difficulties, slurred speech, facial droop, or headache.    He also denies any recent chest pain, shortness of breath, palpitations, diaphoresis, presyncope, or syncope.   No known history of previous stroke, and medical history notable for essential hypertension as well as hyperlipidemia, for which she is on pravastatin 80 mg p.o. daily at home.  He also has a history of obstructive sleep apnea, reporting good corresponding compliance with  home nocturnal CPAP.  No use of recreational drugs, and the patient confirms that he is a former smoker, having completely quit smoking the mid 1990s, without subsequent recurrence.  Denies any known history of diabetes or paroxysmal atrial fibrillation.  Notes that he is on a daily baby aspirin but otherwise no blood thinners as an outpatient.   He subsequently presented to [facility] emergency department for further evaluation of the aforementioned acute changes in vision in his right eye.    While  at [facility] ED, he underwent CT head that showed no evidence of acute process, including no evidence of intracranial hemorrhage.  He subsequently underwent ED to ED transfer from [facility] to [facility] to expedite pursuit of MRI brain to further evaluate the above.    Of note, the patient underwent most recent echocardiogram on  08/02/22, which was postop day 1 status post TAVR.  This echocardiogram is notable for LVEF 55 to 60%, moderate concentric LVH, right ventricular systolic function normal, mild to moderately dilated left atrium, mildly dilated right atrium.    [facility] ED Course:  Vital signs in the ED were notable for the following: Afebrile; heart rate 23-53; systolic blood pressure in the 150s to 180s mmHg; respiratory rate 16-18; oxygen saturation 96 to 97% on room air.   Imaging and additional notable ED work-up: MRI brain showed acute infarcts in the bilateral cerebellar hemispheres, affecting the right PICA and bilateral superior cerebellar artery territory.  The inferior right cerebellar infarct is associated with petechial hemorrhage.  Local mass effect in the posterior fossa with sulcal effacement and effacement of  the fourth ventricle without evidence of resultant hydrocephalus.    Additionally, he underwent MRI of the bilateral orbits, showing no evidence of acute process.   EKG shows sinus rhythm with single PVC, heart rate 64, normal intervals, and no evidence of T wave or ST changes,  including no evidence of ST elevation.   [facility] EDP discussed patient's case and imaging with the on-call neurologist, Dr. Cheral Marker, Who recommended admission to the hospital service for further stroke work-up, giving that neurology will formally consult, with additional recommendations pending at this time.    Subsequently, the patient was admitted for observation for acute ischemic stroke.    Assessment & Plan:   Principal Problem:   Acute ischemic stroke ([place]) Active Problems:   OSA on CPAP   GERD (gastroesophageal reflux disease)   Essential hypertension   COPD (chronic obstructive pulmonary disease) (HCC)   Hyperlipidemia  #1 acute ischemic stroke status post TAVR 9/5 -Patient presenting with acute visual changes in the right eye felt to be likely cardioembolic source from recent TAVR surgery. -MRI brain done with acute infarcts in bilateral cerebral hemispheres affecting the right peak and bilateral superior cerebellar artery territory concerning for emboli. -2D echo done with grade 1 diastolic dysfunction, EF of 55 to 60%, no source of emboli noted. -CT angiogram head and neck done with mild atherosclerosis without large vessel occlusion or high-grade stenosis.  Mild extrinsic narrowing of V2 segment due to cervical spondylosis. -Repeat head CT done 08/07/2022 with evolving recent bilateral cerebellar infarcts.  No acute hemorrhage or worsening mass effect. -Fasting lipid panel LDL of 75, hemoglobin A1c of 5.2 -Patient seen in consultation by neurology who was following and recommending aspirin and Plavix x3 weeks and then aspirin 325 mg daily only after 3 weeks. -Neurology recommending switching statin to Crestor on discharge. -Outpatient follow-up with neurology. -We will need outpatient follow-up with ophthalmologist as well.  2.  Hypertension -Was on Norvasc and lisinopril prior to admission which was held due to permissive hypertension. -Home regimen Norvasc.  3.   Hyperlipidemia -LDL noted at 75, currently on pravastatin. -Neurology recommending switching to Crestor on discharge.  4.  OSA -CPAP nightly.  5.  Status post TAVR with severe aortic stenosis -Was seen by cardiology during the hospitalization however has signed off as of 08/08/2022. -Outpatient follow-up with cardiology.  6.  COPD -stable  7.  Constipation -Continue current bowel regimen. -Patient requesting Dulcolax suppository as he is not had a bowel movement today concern for constipation. -   DVT prophylaxis: SCDs Code Status: Full Family Communication: Updated patient.  No family at bedside. Disposition: Home with home health when clinically improved hopefully in the next 24 hours.  Status is: Inpatient Remains inpatient appropriate because: Severity of illness   Consultants:  Neurology: Dr. Cheral Marker 08/07/2022 Cardiology: Dr. Angelena Form 08/07/2022  Procedures:  CT head 08/07/2022 MRI brain 08/07/2022 MRI orbits 08/07/2022 2D echo 08/07/2022 CT angiogram head and neck 08/07/2022  Antimicrobials:  None   Subjective: Sitting up in bed.  States visual changes on admission slowly improving with less dots noted.  No chest pain.  No shortness of breath.  No abdominal pain.  Complain of constipation.  Asking when he is going to be able to go home.  Objective: Vitals:   08/09/22 1703 08/09/22 1800 08/09/22 1920 08/09/22 2015  BP:  (!) 155/88 (!) 147/79   Pulse: 60 70 74 74  Resp: 20  (!) 22   Temp: 98.5 F (36.9 C)  98.5 F (  36.9 C)   TempSrc: Oral  Oral   SpO2: 94% 97% 92% 94%  Weight:      Height:       No intake or output data in the 24 hours ending 08/09/22 2041 Filed Weights   08/06/22 1834  Weight: 80.2 kg    Examination:  General exam: Appears calm and comfortable  Respiratory system: Clear to auscultation.  No wheezes, no crackles, no rhonchi.  Fair air movement.  Speaking in full sentences.  Respiratory effort normal. Cardiovascular system: S1 & S2  heard, RRR. No JVD, murmurs, rubs, gallops or clicks. No pedal edema. Gastrointestinal system: Abdomen is nondistended, soft and nontender. No organomegaly or masses felt. Normal bowel sounds heard. Central nervous system: Alert and oriented. No focal neurological deficits. Extremities: Symmetric 5 x 5 power. Skin: No rashes, lesions or ulcers Psychiatry: Judgement and insight appear normal. Mood & affect appropriate.     Data Reviewed: I have personally reviewed following labs and imaging studies  CBC: Recent Labs  Lab 08/03/22 0217 08/07/22 0723 08/09/22 0312  WBC 12.4* 8.5 8.3  NEUTROABS  --  6.0  --   HGB 11.2* 12.8* 12.4*  HCT 33.8* 37.2* 36.6*  MCV 89.4 88.8 88.6  PLT 118* 142* 563    Basic Metabolic Panel: Recent Labs  Lab 08/03/22 0217 08/07/22 0723 08/09/22 0312  NA 137 139 138  K 3.4* 3.4* 3.5  CL 106 106 105  CO2 '25 24 26  '$ GLUCOSE 99 94 98  BUN '19 11 17  '$ CREATININE 0.84 0.69 0.75  CALCIUM 8.9 9.2 9.3  MG  --  2.0  --     GFR: Estimated Creatinine Clearance: 75.1 mL/min (by C-G formula based on SCr of 0.75 mg/dL).  Liver Function Tests: Recent Labs  Lab 08/07/22 0723  AST 23  ALT 18  ALKPHOS 58  BILITOT 0.8  PROT 6.8  ALBUMIN 3.5    CBG: No results for input(s): "GLUCAP" in the last 168 hours.   No results found for this or any previous visit (from the past 240 hour(s)).       Radiology Studies: No results found.      Scheduled Meds:  aspirin  81 mg Oral Daily   clopidogrel  75 mg Oral Daily   docusate sodium  100 mg Oral BID   famotidine  40 mg Oral QPM   melatonin  10 mg Oral QHS   mometasone-formoterol  2 puff Inhalation BID   pantoprazole  40 mg Oral Daily   polyethylene glycol  17 g Oral Daily   rosuvastatin  10 mg Oral Daily   Continuous Infusions:   LOS: 2 days    Time spent: 40 minutes    Irine Seal, MD [facility]   To contact the attending provider between 7A-7P or the covering provider  during after hours 7P-7A, please log into the web site [email or web address] and access using universal [place] password for that web site. If you do not have the password, please call the hospital operator.  08/09/2022, 8:41 PM
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
## 2022-08-10 ENCOUNTER — Other Ambulatory Visit (HOSPITAL_COMMUNITY): Payer: Self-pay

## 2022-08-10 DIAGNOSIS — K5900 Constipation, unspecified: Secondary | ICD-10-CM

## 2022-08-10 LAB — CBC
HCT: 37.2 % — ABNORMAL LOW (ref 39.0–52.0)
Hemoglobin: 12.8 g/dL — ABNORMAL LOW (ref 13.0–17.0)
MCH: 30.4 pg (ref 26.0–34.0)
MCHC: 34.4 g/dL (ref 30.0–36.0)
MCV: 88.4 fL (ref 80.0–100.0)
Platelets: 176 10*3/uL (ref 150–400)
RBC: 4.21 MIL/uL — ABNORMAL LOW (ref 4.22–5.81)
RDW: 13.7 % (ref 11.5–15.5)
WBC: 8.6 10*3/uL (ref 4.0–10.5)
nRBC: 0 % (ref 0.0–0.2)

## 2022-08-10 LAB — BASIC METABOLIC PANEL
Anion gap: 9 (ref 5–15)
BUN: 16 mg/dL (ref 8–23)
CO2: 25 mmol/L (ref 22–32)
Calcium: 9.3 mg/dL (ref 8.9–10.3)
Chloride: 104 mmol/L (ref 98–111)
Creatinine, Ser: 0.7 mg/dL (ref 0.61–1.24)
GFR, Estimated: >60 mL/min (ref >60)
Glucose, Bld: 94 mg/dL (ref 70–99)
Potassium: 3.3 mmol/L — ABNORMAL LOW (ref 3.5–5.1)
Sodium: 138 mmol/L (ref 135–145)

## 2022-08-10 LAB — MAGNESIUM: Magnesium: 2.1 mg/dL (ref 1.7–2.4)

## 2022-08-10 MED ORDER — POTASSIUM CHLORIDE CRYS ER 10 MEQ PO TBCR
40.0000 meq | EXTENDED_RELEASE_TABLET | Freq: Once | ORAL | Status: AC
  Administered 2022-08-10: 40 meq via ORAL
  Filled 2022-08-10: qty 4

## 2022-08-10 MED ORDER — ASPIRIN 325 MG PO TABS: 325.0000 mg | ORAL_TABLET | Freq: Every day | ORAL | 3 refills | Status: DC

## 2022-08-10 MED ORDER — BISACODYL 10 MG RE SUPP: 10.0000 mg | Freq: Every day | RECTAL | Status: DC | PRN

## 2022-08-10 MED ORDER — ROSUVASTATIN CALCIUM 10 MG PO TABS
10.0000 mg | ORAL_TABLET | Freq: Every day | ORAL | 1 refills | Status: DC
  Filled 2022-08-10: qty 30, 30d supply, fill #0

## 2022-08-10 MED ORDER — CLOPIDOGREL BISULFATE 75 MG PO TABS
75.0000 mg | ORAL_TABLET | Freq: Every day | ORAL | 0 refills | Status: AC
  Filled 2022-08-10: qty 21, 21d supply, fill #0

## 2022-08-10 MED ORDER — DOXAZOSIN MESYLATE 8 MG PO TABS
8.0000 mg | ORAL_TABLET | Freq: Every day | ORAL | Status: DC
  Filled 2022-08-10: qty 1

## 2022-08-10 MED ORDER — LISINOPRIL 40 MG PO TABS: 40.0000 mg | ORAL_TABLET | Freq: Every day | ORAL | Status: AC

## 2022-08-10 MED ORDER — PSYLLIUM 95 % PO PACK
1.0000 | PACK | Freq: Every day | ORAL | Status: DC
  Filled 2022-08-10: qty 1

## 2022-08-10 MED ORDER — SPIRONOLACTONE 25 MG PO TABS: 12.5000 mg | ORAL_TABLET | Freq: Every day | ORAL | Status: DC

## 2022-08-10 MED ORDER — FLUTICASONE PROPIONATE 50 MCG/ACT NA SUSP
2.0000 | Freq: Every day | NASAL | Status: DC
  Filled 2022-08-10: qty 16

## 2022-08-10 MED ORDER — SPIRONOLACTONE 25 MG PO TABS: 12.5000 mg | ORAL_TABLET | Freq: Every day | ORAL | Status: AC

## 2022-08-10 MED ORDER — VITAMIN E 45 MG (100 UNIT) PO CAPS
400.0000 [IU] | ORAL_CAPSULE | Freq: Every day | ORAL | Status: DC
  Administered 2022-08-10: 400 [IU] via ORAL
  Filled 2022-08-10: qty 4

## 2022-08-10 MED ORDER — DICLOFENAC SODIUM 75 MG PO TBEC
75.0000 mg | DELAYED_RELEASE_TABLET | Freq: Two times a day (BID) | ORAL | Status: DC
  Administered 2022-08-10: 75 mg via ORAL
  Filled 2022-08-10 (×2): qty 1

## 2022-08-10 MED ORDER — AMLODIPINE BESYLATE 5 MG PO TABS
5.0000 mg | ORAL_TABLET | Freq: Every day | ORAL | Status: DC
  Administered 2022-08-10: 5 mg via ORAL
  Filled 2022-08-10: qty 1

## 2022-08-10 MED ORDER — PANTOPRAZOLE SODIUM 40 MG PO TBEC
40.0000 mg | DELAYED_RELEASE_TABLET | Freq: Every day | ORAL | 1 refills | Status: DC
  Filled 2022-08-10: qty 30, 30d supply, fill #0

## 2022-08-10 MED ORDER — ASPIRIN 81 MG PO TBEC: 81.0000 mg | DELAYED_RELEASE_TABLET | Freq: Every day | ORAL | 0 refills | Status: AC
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
## 2022-08-10 NOTE — Discharge Summary (Signed)
Physician Discharge Summary  EDIN KON PFX:[id number] DOB: 06/26/1946 DOA: 08/06/2022  PCP: Maury Dus, MD  Admit date: 08/06/2022 Discharge date: 08/10/2022  Time spent: 60 minutes  Recommendations for Outpatient Follow-up:  Follow-up with Maury Dus, MD in 2 weeks.  On follow-up patient need a basic metabolic profile done to follow-up on electrolytes and renal function.  Patient will need a magnesium level checked.  Patient blood pressure will need to be followed up upon on discharge as well as further management of patient's hyperlipidemia. Follow-up with neurology in 4 weeks. Follow-up with Dr. Ali Lowe, cardiology in 1 week. Follow-up with Dr. Posey Pronto, ophthalmology in 1 week.   Discharge Diagnoses:  Principal Problem:   Acute ischemic stroke [facility]) Active Problems:   OSA on CPAP   GERD (gastroesophageal reflux disease)   Essential hypertension   COPD (chronic obstructive pulmonary disease) (HCC)   Hyperlipidemia   Constipation   Discharge Condition: Stable and improved.  Diet recommendation: Heart healthy  Filed Weights   08/06/22 1834  Weight: 80.2 kg    History of present illness:  HPI per Dr. [name] is a 76 y.o. male with medical history significant for severe aortic stenosis status post TAVR on 08/01/2022, COPD, essential hypertension, hyperlipidemia, obstructive sleep apnea on nocturnal CPAP, who is admitted to [facility] on 08/06/2022 with acute ischemic stroke following a ED to ED transfer from [facility] ED to [facility] ED after presenting to the former facility complaining change in vision in right eye.    In the setting of severe aortic stenosis, the patient underwent TAVR at [facility] on 08/01/2022, and was discharged home on 08/03/2022.  The patient reports that he awoke on the morning of Friday, 08/04/2022, in his normal state of health, without any acute focal neurologic deficits or visual field changes at that time.  However,  starting around 10 AM on 08/04/2022, as the patient reports acute onset change in his vision in the right eye, noting the appearance of dark-colored spots in all 4 quadrants of the right eye, while denying any visual changes in the left eye.  He notes that these changes in vision have persisted and are still present at this time.  No recent preceding trauma.   Denies any associated acute focal weakness, acute focal numbness, paresthesias, dysphagia, dizziness, vertigo, nausea, vomiting, word finding difficulties, slurred speech, facial droop, or headache.    He also denies any recent chest pain, shortness of breath, palpitations, diaphoresis, presyncope, or syncope.   No known history of previous stroke, and medical history notable for essential hypertension as well as hyperlipidemia, for which she is on pravastatin 80 mg p.o. daily at home.  He also has a history of obstructive sleep apnea, reporting good corresponding compliance with home nocturnal CPAP.  No use of recreational drugs, and the patient confirms that he is a former smoker, having completely quit smoking the mid 1990s, without subsequent recurrence.  Denies any known history of diabetes or paroxysmal atrial fibrillation.  Notes that he is on a daily baby aspirin but otherwise no blood thinners as an outpatient.   He subsequently presented to [facility] emergency department for further evaluation of the aforementioned acute changes in vision in his right eye.    Will at [facility] ED, he underwent CT head that showed no evidence of acute process, including no evidence of intracranial hemorrhage.  He subsequently underwent ED to ED transfer from [facility] to [facility] to expedite pursuit of MRI brain to further  evaluate the above.    Of note, the patient underwent most recent echocardiogram on  08/02/22, which was postop day 1 status post TAVR.  This echocardiogram is notable for LVEF 55 to 60%, moderate concentric LVH, right ventricular systolic function  normal, mild to moderately dilated left atrium, mildly dilated right atrium.        [facility] ED Course:  Vital signs in the ED were notable for the following: Afebrile; heart rate 29-93; systolic blood pressure in the 150s to 180s mmHg; respiratory rate 16-18; oxygen saturation 96 to 97% on room air.   Imaging and additional notable ED work-up: MRI brain showed acute infarcts in the bilateral cerebellar hemispheres, affecting the right PICA and bilateral superior cerebellar artery territory.  The inferior right cerebellar infarct is associated with petechial hemorrhage.  Local mass effect in the posterior fossa with sulcal effacement and effacement of the fourth ventricle without evidence of resultant hydrocephalus.    Additionally, he underwent MRI of the bilateral orbits, showing no evidence of acute process.   EKG shows sinus rhythm with single PVC, heart rate 64, normal intervals, and no evidence of T wave or ST changes, including no evidence of ST elevation.   [facility] EDP discussed patient's case and imaging with the on-call neurologist, Dr. Cheral Marker, Who recommended admission to the hospital service for further stroke work-up, giving that neurology will formally consult, with additional recommendations pending at this time.    Subsequently, the patient was admitted for observation for acute ischemic stroke.      Hospital Course:  #1 acute ischemic stroke status post TAVR 9/5 -Patient presented with acute visual changes in the right eye felt to be likely cardioembolic source from recent TAVR surgery. -MRI brain done with acute infarcts in bilateral cerebral hemispheres affecting the right peak and bilateral superior cerebellar artery territory concerning for emboli. -2D echo done with grade 1 diastolic dysfunction, EF of 55 to 60%, no source of emboli noted. -CT angiogram head and neck done with mild atherosclerosis without large vessel occlusion or high-grade stenosis.  Mild extrinsic  narrowing of V2 segment due to cervical spondylosis. -Repeat head CT done 08/07/2022 with evolving recent bilateral cerebellar infarcts.  No acute hemorrhage or worsening mass effect. -Fasting lipid panel LDL of 75, hemoglobin A1c of 5.2 -Patient seen in consultation by neurology who was following and recommended aspirin and Plavix x3 weeks and then aspirin 325 mg daily only after 3 weeks. -Neurology recommended switching statin to Crestor on discharge. -Patient improved clinically throughout the hospitalization. -Outpatient follow-up with neurology. -We will need outpatient follow-up with ophthalmologist as well.   2.  Hypertension -Was on Norvasc and lisinopril prior to admission which was held due to permissive hypertension during the hospitalization. -Norvasc resumed. -Home regimen lisinopril to be resumed 4 days postdischarge and home regimen spironolactone to be resumed 1 week postdischarge. -Outpatient follow-up with PCP.   3.  Hyperlipidemia -LDL noted at 75, currently on pravastatin. -Neurology recommended switching to Crestor on discharge. -Outpatient follow-up with PCP.   4.  OSA -CPAP nightly.   5.  Status post TAVR with severe aortic stenosis -Was seen by cardiology during the hospitalization, with no further recommendations and cardiology signed off as of 08/08/2022.  -Outpatient follow-up with cardiology.   6.  COPD -Remained stable   7.  Constipation -Patient placed on a bowel regiment with good results.   -Patient will discharged back on home bowel regimen on discharge.  Outpatient follow-up with PCP.  -  Procedures: CT head 08/07/2022 MRI brain 08/07/2022 MRI orbits 08/07/2022 2D echo 08/07/2022 CT angiogram head and neck 08/07/2022    Consultations: Neurology: Dr. Cheral Marker 08/07/2022 Cardiology: Dr. Angelena Form 08/07/2022  Discharge Exam: Vitals:   08/10/22 0906 08/10/22 1307  BP: (!) 170/88 (!) 149/92  Pulse:  64  Resp: 16 (!) 22  Temp:  98.6 F (37 C)   SpO2: 93% 98%    General: NAD Cardiovascular: Regular rate rhythm no murmurs rubs or gallops.  No JVD.  No lower extremity edema. Respiratory: Clear to auscultation bilaterally.  No wheezes, no crackles, no rhonchi.  Fair air movement.  Speaking in full sentences.  Discharge Instructions   Discharge Instructions     Ambulatory referral to Neurology   Complete by: As directed    An appointment is requested in approximately: 4 weeks   Diet - low sodium heart healthy   Complete by: As directed    Increase activity slowly   Complete by: As directed       Allergies as of 08/10/2022       Reactions   Compazine [prochlorperazine Edisylate] Anaphylaxis, Other (See Comments)   Pt states "any nausea med ending in "zine"  Seizures   Cymbalta [duloxetine Hcl] Other (See Comments)   Urinary retention    Promethazine Anaphylaxis, Other (See Comments)   seizures   Codeine Other (See Comments)   constipation   Other Other (See Comments)   All antiemetic meds ending -zine  Prefers to not take any narcotic pain meds        Medication List     STOP taking these medications    omeprazole 20 MG capsule Commonly known as: PRILOSEC Replaced by: pantoprazole 40 MG tablet   pravastatin 80 MG tablet Commonly known as: PRAVACHOL       TAKE these medications    acetaminophen 650 MG CR tablet Commonly known as: TYLENOL Take 1,300 mg by mouth every 8 (eight) hours as needed for pain.   albuterol 108 (90 Base) MCG/ACT inhaler Commonly known as: VENTOLIN HFA Inhale 1-2 puffs into the lungs every 6 (six) hours as needed for shortness of breath or wheezing.   amLODipine 5 MG tablet Commonly known as: NORVASC Take 5 mg by mouth daily.   aspirin EC 81 MG tablet Take 1 tablet (81 mg total) by mouth daily for 21 days. Swallow whole. What changed: Another medication with the same name was added. Make sure you understand how and when to take each.   aspirin 325 MG tablet Commonly  known as: Bayer Aspirin Take 1 tablet (325 mg total) by mouth daily. Start taking on: September 01, 2022 What changed: You were already taking a medication with the same name, and this prescription was added. Make sure you understand how and when to take each.   budesonide-formoterol 160-4.5 MCG/ACT inhaler Commonly known as: SYMBICORT Inhale 2 puffs into the lungs 2 (two) times daily.   clopidogrel 75 MG tablet Commonly known as: PLAVIX Take 1 tablet (75 mg total) by mouth daily for 21 days. Start taking on: August 11, 2022   diclofenac 75 MG EC tablet Commonly known as: VOLTAREN Take 75 mg by mouth 2 (two) times daily.   doxazosin 8 MG tablet Commonly known as: CARDURA Take 8 mg by mouth at bedtime.   famotidine 40 MG tablet Commonly known as: PEPCID Take 40 mg by mouth every evening.   fluticasone 50 MCG/ACT nasal spray Commonly known as: FLONASE Place 2 sprays into both nostrils  at bedtime.   lisinopril 40 MG tablet Commonly known as: ZESTRIL Take 1 tablet (40 mg total) by mouth daily. Start taking on: August 14, 2022 What changed: These instructions start on August 14, 2022. If you are unsure what to do until then, ask your doctor or other care provider.   Melatonin 10 MG Tabs Take 20 mg by mouth at bedtime.   Metamucil 28.3 % Powd Generic drug: Psyllium Take 1 Scoop by mouth daily.   Mucinex DM Maximum Strength 60-1200 MG Tb12 Take 1 tablet by mouth at bedtime.   multivitamin capsule Take 1 capsule by mouth daily.   NON FORMULARY Pt uses a cpap nightly   pantoprazole 40 MG tablet Commonly known as: PROTONIX Take 1 tablet (40 mg total) by mouth daily. Start taking on: August 11, 2022 Replaces: omeprazole 20 MG capsule   potassium chloride SA 20 MEQ tablet Commonly known as: KLOR-CON M Take 20 mEq by mouth daily.   rosuvastatin 10 MG tablet Commonly known as: CRESTOR Take 1 tablet (10 mg total) by mouth daily. Start taking on: August 11, 2022   spironolactone 25 MG tablet Commonly known as: ALDACTONE Take 0.5 tablets (12.5 mg total) by mouth daily. Take 6.25 mg by mouth daily. Start taking on: August 18, 2022 What changed:  how much to take additional instructions These instructions start on August 18, 2022. If you are unsure what to do until then, ask your doctor or other care provider.   tiZANidine 4 MG capsule Commonly known as: ZANAFLEX Take 1 capsule (4 mg total) by mouth 3 (three) times daily.   traMADol 50 MG tablet Commonly known as: ULTRAM Take 1-2 tablets (50-100 mg total) by mouth every 6 (six) hours as needed (mild pain).   Vitamin D3 50 MCG (2000 UT) capsule Take 2,000 Units by mouth daily.   vitamin E 180 MG (400 UNITS) capsule Take 400 Units by mouth daily.   zolpidem 12.5 MG CR tablet Commonly known as: AMBIEN CR Take 12.5 mg by mouth at bedtime.       Allergies  Allergen Reactions   Compazine [Prochlorperazine Edisylate] Anaphylaxis and Other (See Comments)    Pt states "any nausea med ending in "zine"  Seizures   Cymbalta [Duloxetine Hcl] Other (See Comments)    Urinary retention    Promethazine Anaphylaxis and Other (See Comments)    seizures   Codeine Other (See Comments)    constipation   Other Other (See Comments)    All antiemetic meds ending -zine  Prefers to not take any narcotic pain meds    Follow-up Information     Jalene Mullet, MD. Schedule an appointment as soon as possible for a visit in 1 week(s).   Specialty: Ophthalmology Contact information: [address] [place] [address] [phone]         [facility], [facility] Follow up.   Specialty: Home Health Services Why: Someone from [facility] will contact you to arrange start date and time for your therapy. Contact information: [place] [address] [phone]         [facility]. Call.   Why: For help locating an independent living or senior  community. Service is free of charge to you. Contact information: [place] [phone]        Maury Dus, MD. Schedule an appointment as soon as possible for a visit in 2 week(s).   Specialty: Family Medicine Contact information: [address]  [place] [address] [phone]         Early Osmond, MD. Schedule an appointment as soon as possible for a visit in 1 week(s).   Specialty: Cardiology Contact information: [place] [address] [phone]         [facility]. Schedule an appointment as soon as possible for a visit in 1 month(s).   Specialty: Neurology Contact information: [address] [place] [address] [place] [phone]                 The results of significant diagnostics from this hospitalization (including imaging, microbiology, ancillary and laboratory) are listed below for reference.    Significant Diagnostic Studies: CT ANGIO HEAD NECK W WO CM (CODE STROKE)  Result Date: 08/07/2022 CLINICAL DATA:  Stroke, follow up Neuro deficit, acute, stroke suspected. Bilateral cerebellar infarcts. EXAM: CT ANGIOGRAPHY HEAD AND NECK TECHNIQUE: Multidetector CT imaging of the head and neck was performed using the standard protocol during bolus administration of intravenous contrast. Multiplanar CT image reconstructions and MIPs were obtained to evaluate the vascular anatomy. Carotid stenosis measurements (when applicable) are obtained utilizing NASCET criteria, using the distal internal carotid diameter as the denominator. RADIATION DOSE REDUCTION: This exam was performed according to the departmental dose-optimization program which includes automated exposure control, adjustment of the mA and/or kV according to patient size and/or use of iterative reconstruction technique. CONTRAST:  76m GADAVIST GADOBUTROL 1 MMOL/ML IV SOLN COMPARISON:  None Available. FINDINGS: CTA  NECK FINDINGS Aortic arch: Standard 3 vessel aortic arch with mild atherosclerotic plaque. No significant arch vessel origin stenosis. Right carotid system: Patent with a small amount of calcified and soft plaque at the carotid bifurcation and in the carotid bulb. No evidence of a significant stenosis or dissection. Tortuous distal cervical ICA. Left carotid system: Patent with a small amount of predominantly calcified plaque at the carotid bifurcation. No evidence of a significant stenosis or dissection. Vertebral arteries: Patent with the left being mildly to moderately dominant. Mild extrinsic narrowing of both V2 segments due to degenerative spurring in the cervical spine. Skeleton: Widespread advanced cervical disc degeneration. Advanced facet arthrosis. Interbody and left-sided facet ankylosis at C4-5. Other neck: No evidence of cervical lymphadenopathy or mass. Upper chest: No apical lung consolidation or mass. Review of the MIP images confirms the above findings CTA HEAD FINDINGS Anterior circulation: The internal carotid arteries are patent from skull base to carotid termini with mild atherosclerosis bilaterally not resulting in significant stenosis. ACAs and MCAs are patent without evidence of a proximal branch occlusion or significant proximal stenosis. No aneurysm is identified. Posterior circulation: The intracranial vertebral arteries are patent to the basilar with mild atherosclerotic irregularity bilaterally but no significant stenosis. Patent PICA and SCA origins are seen bilaterally. The basilar artery is patent with mild atherosclerotic irregularity but no significant stenosis. Posterior communicating arteries are diminutive or absent. Both PCAs are patent without evidence of a significant proximal stenosis. No aneurysm is identified. Venous sinuses: Patent. Anatomic variants: None. Review of the MIP images confirms the above findings IMPRESSION: 1. Mild atherosclerosis in the head and neck  without large vessel occlusion or high-grade proximal stenosis. 2. Mild extrinsic narrowing of both V2 segments due to cervical spondylosis. 3. Aortic Atherosclerosis (ICD10-I70.0). Electronically Signed   By: ALogan BoresM.D.   On: 08/07/2022 15:08   ECHOCARDIOGRAM COMPLETE  Result Date: 08/07/2022    ECHOCARDIOGRAM REPORT   Patient Name:   LPurcell Nails  A Stump Date of Exam: 08/07/2022 Medical Rec #:  [id number]          Height:       64.0 in Accession #:    [id number]         Weight:       176.8 lb Date of Birth:  05/31/1946         BSA:          1.857 m Patient Age:    50 years           BP:           145/86 mmHg Patient Gender: M                  HR:           66 bpm. Exam Location:  Inpatient Procedure: 2D Echo, Cardiac Doppler and Color Doppler Indications:    Stroke  History:        Patient has prior history of Echocardiogram examinations, most                 recent 08/02/2022. AAA, TAVR (08/01/22), COPD, Aortic Valve                 Disease; Risk Factors:Sleep Apnea, Former Smoker, Hypertension,                 Dyslipidemia and Alcohol.  Sonographer:    Eartha Inch Referring Phys: August Albino  Sonographer Comments: Technically difficult study due to poor echo windows. Image acquisition challenging due to patient body habitus and Image acquisition challenging due to respiratory motion. IMPRESSIONS  1. Left ventricular ejection fraction, by estimation, is 55 to 60%. The left ventricle has normal function. The left ventricle has no regional wall motion abnormalities. Left ventricular diastolic parameters are consistent with Grade I diastolic dysfunction (impaired relaxation).  2. Right ventricular systolic function is normal. The right ventricular size is normal. Tricuspid regurgitation signal is inadequate for assessing PA pressure.  3. The mitral valve is normal in structure. No evidence of mitral valve regurgitation. No evidence of mitral stenosis.  4. 29 mm Sapien Valve without no PVL or evidence of  prosthetic stenosis.. The aortic valve has been repaired/replaced. Aortic valve regurgitation is not visualized. No aortic stenosis is present.  5. The inferior vena cava is normal in size with greater than 50% respiratory variability, suggesting right atrial pressure of 3 mmHg. Comparison(s): No significant change from prior study. FINDINGS  Left Ventricle: Left ventricular ejection fraction, by estimation, is 55 to 60%. The left ventricle has normal function. The left ventricle has no regional wall motion abnormalities. The left ventricular internal cavity size was normal in size. Suboptimal image quality limits for assessment of left ventricular hypertrophy. Left ventricular diastolic parameters are consistent with Grade I diastolic dysfunction (impaired relaxation). Right Ventricle: The right ventricular size is normal. No increase in right ventricular wall thickness. Right ventricular systolic function is normal. Tricuspid regurgitation signal is inadequate for assessing PA pressure. Left Atrium: Left atrial size was normal in size. Right Atrium: Right atrial size was normal in size. Pericardium: There is no evidence of pericardial effusion. Mitral Valve: The mitral valve is normal in structure. No evidence of mitral valve regurgitation. No evidence of mitral valve stenosis. Tricuspid Valve: The tricuspid valve is normal in structure. Tricuspid valve regurgitation is not demonstrated. No evidence of tricuspid stenosis. Aortic Valve: 29 mm Sapien Valve without no PVL or evidence of prosthetic stenosis. The aortic  valve has been repaired/replaced. Aortic valve regurgitation is not visualized. No aortic stenosis is present. Aortic valve mean gradient measures 8.0 mmHg. Aortic valve peak gradient measures 12.8 mmHg. Aortic valve area, by VTI measures 2.56 cm. Pulmonic Valve: The pulmonic valve was normal in structure. Pulmonic valve regurgitation is not visualized. No evidence of pulmonic stenosis. Aorta: The  aortic root is normal in size and structure. Venous: The inferior vena cava is normal in size with greater than 50% respiratory variability, suggesting right atrial pressure of 3 mmHg. IAS/Shunts: No atrial level shunt detected by color flow Doppler.  LEFT VENTRICLE PLAX 2D LVIDd:         4.80 cm   Diastology LVIDs:         3.40 cm   LV e' medial:    6.40 cm/s LV PW:         1.00 cm   LV E/e' medial:  9.2 LV IVS:        1.00 cm   LV e' lateral:   5.96 cm/s LVOT diam:     2.20 cm   LV E/e' lateral: 9.8 LV SV:         81 LV SV Index:   44 LVOT Area:     3.80 cm  RIGHT VENTRICLE             IVC RV S prime:     16.50 cm/s  IVC diam: 1.30 cm TAPSE (M-mode): 2.0 cm LEFT ATRIUM             Index        RIGHT ATRIUM           Index LA diam:        3.20 cm 1.72 cm/m   RA Area:     12.10 cm LA Vol (A2C):   49.3 ml 26.55 ml/m  RA Volume:   20.30 ml  10.93 ml/m LA Vol (A4C):   54.3 ml 29.25 ml/m LA Biplane Vol: 56.3 ml 30.33 ml/m  AORTIC VALVE AV Area (Vmax):    2.23 cm AV Area (Vmean):   2.14 cm AV Area (VTI):     2.56 cm AV Vmax:           179.00 cm/s AV Vmean:          136.000 cm/s AV VTI:            0.318 m AV Peak Grad:      12.8 mmHg AV Mean Grad:      8.0 mmHg LVOT Vmax:         105.00 cm/s LVOT Vmean:        76.400 cm/s LVOT VTI:          0.214 m LVOT/AV VTI ratio: 0.67  AORTA Ao Root diam: 2.90 cm MITRAL VALVE MV Area (PHT): 1.62 cm     SHUNTS MV Decel Time: 467 msec     Systemic VTI:  0.21 m MV E velocity: 58.60 cm/s   Systemic Diam: 2.20 cm MV A velocity: 113.00 cm/s MV E/A ratio:  0.52 Rudean Haskell MD Electronically signed by Rudean Haskell MD Signature Date/Time: 08/07/2022/12:33:34 PM    Final    CT HEAD WO CONTRAST (5MM)  Result Date: 08/07/2022 CLINICAL DATA:  Stroke follow-up EXAM: CT HEAD WITHOUT CONTRAST TECHNIQUE: Contiguous axial images were obtained from the base of the skull through the vertex without intravenous contrast. RADIATION DOSE REDUCTION: This exam was performed  according to the departmental dose-optimization program which  includes automated exposure control, adjustment of the mA and/or kV according to patient size and/or use of iterative reconstruction technique. COMPARISON:  Recent CT and MR imaging FINDINGS: Brain: Acute and chronic bilateral cerebellar infarcts are again identified. No obstructive hydrocephalus. No acute intracranial hemorrhage. Stable findings of parenchymal volume loss and probable chronic microvascular ischemic changes. No extra-axial collection. Vascular: There is atherosclerotic calcification at the skull base. Skull: Calvarium is unremarkable. Sinuses/Orbits: No acute finding. Other: Mastoid air cells are clear. IMPRESSION: Evolving recent bilateral cerebellar infarcts. No acute hemorrhage or worsening mass effect. Electronically Signed   By: Macy Mis M.D.   On: 08/07/2022 09:12   MR BRAIN W WO CONTRAST  Result Date: 08/07/2022 CLINICAL DATA:  Right eye vision changes following TAVR EXAM: MRI HEAD AND ORBITS WITHOUT AND WITH CONTRAST TECHNIQUE: Multiplanar, multiecho pulse sequences of the brain and surrounding structures were obtained without and with intravenous contrast. Multiplanar, multiecho pulse sequences of the orbits and surrounding structures were obtained including fat saturation techniques, before and after intravenous contrast administration. CONTRAST:  83m GADAVIST GADOBUTROL 1 MMOL/ML IV SOLN COMPARISON:  No prior MRI, correlation is made with CT head 08/06/2022 FINDINGS: MRI HEAD FINDINGS Brain: Restricted diffusion with ADC correlate in the bilateral cerebellar hemispheres (series 5, 54-64), primarily affecting the right PICA and bilateral superior cerebellar artery territory, consistent with acute infarct. These areas demonstrate T2 hyperintense signal, consistent with edema, with gyral swelling and sulcal effacement. This causes local mass effect on the fourth ventricle, which is effaced. No definite hydrocephalus.  Susceptibility in the inferior right cerebellum (series 14, images 9-15), likely petechial hemorrhage. Some contrast enhancement is associated with the areas of infarction. Approximately 5 mm of right-to-left midline shift. No extra-axial collection. Confluent T2 hyperintense signal in the periventricular white matter and pons, likely the sequela of severe chronic small vessel ischemic disease. Remote left lateral cerebellar infarct. Dilated perivascular spaces in the basal ganglia and hippocampus. Vascular: Normal arterial flow voids. Skull and upper cervical spine: Normal marrow signal. Other: The mastoids are well aerated. MRI ORBITS FINDINGS Orbits: No traumatic or inflammatory finding. Globes, optic nerves, orbital fat, extraocular muscles, vascular structures, and lacrimal glands are normal. No abnormal enhancement. Status post bilateral lens replacements. Visualized sinuses: Clear. Soft tissues: Negative. IMPRESSION: 1. 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 The inferior right cerebellar infarct is associated with petechial hemorrhage. 2. Local mass effect in the posterior fossa with sulcal effacement and effacement of the fourth ventricle, without evidence of resulting hydrocephalus. 3. No acute finding in the orbits. These results were called by telephone at the time of interpretation on 08/07/2022 at 1:34 am to provider MESSER, who verbally acknowledged these results. Electronically Signed   By: AMerilyn BabaM.D.   On: 08/07/2022 01:35   MR ORBITS W WO CONTRAST  Result Date: 08/07/2022 CLINICAL DATA:  Right eye vision changes following TAVR EXAM: MRI HEAD AND ORBITS WITHOUT AND WITH CONTRAST TECHNIQUE: Multiplanar, multiecho pulse sequences of the brain and surrounding structures were obtained without and with intravenous contrast. Multiplanar, multiecho pulse sequences of the orbits and surrounding structures  were obtained including fat saturation techniques, before and after intravenous contrast administration. CONTRAST:  861mGADAVIST GADOBUTROL 1 MMOL/ML IV SOLN COMPARISON:  No prior MRI, correlation is made with CT head 08/06/2022 FINDINGS: MRI HEAD FINDINGS Brain: Restricted diffusion with ADC correlate in the bilateral cerebellar hemispheres (series 5, 54-64), primarily affecting the right PICA and bilateral  superior cerebellar artery territory, consistent with acute infarct. These areas demonstrate T2 hyperintense signal, consistent with edema, with gyral swelling and sulcal effacement. This causes local mass effect on the fourth ventricle, which is effaced. No definite hydrocephalus. Susceptibility in the inferior right cerebellum (series 14, images 9-15), likely petechial hemorrhage. Some contrast enhancement is associated with the areas of infarction. Approximately 5 mm of right-to-left midline shift. No extra-axial collection. Confluent T2 hyperintense signal in the periventricular white matter and pons, likely the sequela of severe chronic small vessel ischemic disease. Remote left lateral cerebellar infarct. Dilated perivascular spaces in the basal ganglia and hippocampus. Vascular: Normal arterial flow voids. Skull and upper cervical spine: Normal marrow signal. Other: The mastoids are well aerated. MRI ORBITS FINDINGS Orbits: No traumatic or inflammatory finding. Globes, optic nerves, orbital fat, extraocular muscles, vascular structures, and lacrimal glands are normal. No abnormal enhancement. Status post bilateral lens replacements. Visualized sinuses: Clear. Soft tissues: Negative. IMPRESSION: 1. 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 The inferior right cerebellar infarct is associated with petechial hemorrhage. 2. Local mass effect in the posterior fossa with sulcal effacement and effacement of the fourth  ventricle, without evidence of resulting hydrocephalus. 3. No acute finding in the orbits. These results were called by telephone at the time of interpretation on 08/07/2022 at 1:34 am to provider MESSER, who verbally acknowledged these results. Electronically Signed   By: Merilyn Baba M.D.   On: 08/07/2022 01:35   ECHOCARDIOGRAM LIMITED  Result Date: 08/02/2022    ECHOCARDIOGRAM LIMITED REPORT   Patient Name:   MAIKOL GRASSIA Date of Exam: 08/02/2022 Medical Rec #:  [id number]          Height:       64.0 in Accession #:    [id number]         Weight:       175.0 lb Date of Birth:  03-07-1946         BSA:          1.848 m Patient Age:    57 years           BP:           131/52 mmHg Patient Gender: M                  HR:           62 bpm. Exam Location:  Inpatient Procedure: Limited Echo, Color Doppler and Cardiac Doppler Indications:    Post TAVR Evaluation z95.2  History:        Patient has prior history of Echocardiogram examinations, most                 recent 08/01/2022. COPD; Risk Factors:Hypertension, Dyslipidemia                 and Sleep Apnea.                 Aortic Valve: 29 mm Edwards Sapien 3 Ultra TAVR valve is present                 in the aortic position. Procedure Date: 08/01/22.  Sonographer:    Raquel Sarna Senior RDCS Referring Phys: [id number] [place]  1. Left ventricular ejection fraction, by estimation, is 55 to 60%. The left ventricle has normal function. There is moderate concentric left ventricular hypertrophy.  2. Right ventricular systolic function is normal. The right ventricular  size is normal. Tricuspid regurgitation signal is inadequate for assessing PA pressure.  3. Left atrial size was mild to moderately dilated.  4. Right atrial size was mildly dilated.  5. The mitral valve is normal in structure. No evidence of mitral valve regurgitation. No evidence of mitral stenosis.  6. There is a 29 mm Edwards Sapien 3 Ultra TAVR valve present in the aortic position. Procedure  Date: 08/01/22. Echo findings are consistent with normal structure and function of the aortic valve prosthesis. Aortic valve area, by VTI measures 3.16 cm. Aortic valve Vmax measures 1.86 m/s. Aortic valve acceleration time measures 46 msec. FINDINGS  Left Ventricle: Left ventricular ejection fraction, by estimation, is 55 to 60%. The left ventricle has normal function. Mild hypokinesis of the left ventricular, basal lateral wall and inferolateral wall. There is moderate concentric left ventricular hypertrophy. Right Ventricle: The right ventricular size is normal. No increase in right ventricular wall thickness. Right ventricular systolic function is normal. Tricuspid regurgitation signal is inadequate for assessing PA pressure. Left Atrium: Left atrial size was mild to moderately dilated. Right Atrium: Right atrial size was mildly dilated. Pericardium: There is no evidence of pericardial effusion. Mitral Valve: The mitral valve is normal in structure. Mild mitral annular calcification. No evidence of mitral valve stenosis. Tricuspid Valve: The tricuspid valve is normal in structure. Tricuspid valve regurgitation is not demonstrated. Aortic Valve: Aortic valve mean gradient measures 7.0 mmHg. Aortic valve peak gradient measures 13.8 mmHg. Aortic valve area, by VTI measures 3.16 cm. There is a 29 mm Edwards Sapien 3 Ultra TAVR valve present in the aortic position. Procedure Date: 08/01/22. Echo findings are consistent with normal structure and function of the aortic valve prosthesis. Pulmonic Valve: The pulmonic valve was not well visualized. Aorta: The aortic root is normal in size and structure. LEFT VENTRICLE PLAX 2D LVOT diam:     2.40 cm LV SV:         117 LV SV Index:   63 LVOT Area:     4.52 cm  AORTIC VALVE AV Area (Vmax):    2.92 cm AV Area (Vmean):   3.31 cm AV Area (VTI):     3.16 cm AV Vmax:           186.00 cm/s AV Vmean:          117.000 cm/s AV VTI:            0.369 m AV Peak Grad:      13.8 mmHg AV  Mean Grad:      7.0 mmHg LVOT Vmax:         120.00 cm/s LVOT Vmean:        85.700 cm/s LVOT VTI:          0.258 m LVOT/AV VTI ratio: 0.70  SHUNTS Systemic VTI:  0.26 m Systemic Diam: 2.40 cm Sanda Klein MD Electronically signed by Sanda Klein MD Signature Date/Time: 08/02/2022/1:01:55 PM    Final    ECHOCARDIOGRAM LIMITED  Result Date: 08/01/2022    ECHOCARDIOGRAM LIMITED REPORT   Patient Name:   ASAPH SERENA Date of Exam: 08/01/2022 Medical Rec #:  [id number]          Height:       63.0 in Accession #:    [id number]         Weight:       179.4 lb Date of Birth:  [birth year]         BSA:  1.846 m Patient Age:    72 years           BP:           189/76 mmHg Patient Gender: M                  HR:           75 bpm. Exam Location:  Inpatient Procedure: Limited Echo, Color Doppler and Cardiac Doppler Indications:     Aortic Stenosis i35.0  History:         Patient has prior history of Echocardiogram examinations, most                  recent 05/17/2022. COPD; Risk Factors:Hypertension, Dyslipidemia                  and Sleep Apnea.  Sonographer:     Raquel Sarna Senior RDCS Referring Phys:  [id number] [place] Diagnosing Phys: Sanda Klein MD  Sonographer Comments: 30m Edwards [id number] Implanted  PRE-PROCEDURE FINDINGS Normal left ventricular systolic function. Estimated LVEF 55% Trileaflet aortic valve with severe calcific stenosis. Aortic valve peak gradient 60 mm Hg, mean gradient 35 mm Hg, dimensionless index 0.18, calculated valve area 0.63 cm sq (0.34 cm sq/m sq BSA). No aortic insufficiency. No pericardial effusion. POST-PROCEDURE FINDINGS Normal left ventricular systolic function. Estimated LVEF 65% Well deployed stent valve ( 29 mm Edwards [id number] TAVR). Aortic valve peak gradient 7 mm Hg, mean gradient 4 mm Hg, dimensionless index .75, calculated valve area 2.87 cm sq (1.55 cm sq/m sq BSA), acceleration time 60 ms. No aortic insufficiency or perivalvular leak. No pericardial effusion.  IMPRESSIONS   1. Left ventricular ejection fraction, by estimation, is 55 to 60%. The left ventricle has normal function. The left ventricle demonstrates regional wall motion abnormalities (see scoring diagram/findings for description). There is moderate concentric left ventricular hypertrophy. There is mild hypokinesis of the left ventricular, basal lateral wall and inferolateral wall.  2. Right ventricular systolic function is normal. The right ventricular size is normal.  3. Left atrial size was mild to moderately dilated.  4. The mitral valve is normal in structure. No evidence of mitral valve regurgitation. No evidence of mitral stenosis. FINDINGS  Left Ventricle: Left ventricular ejection fraction, by estimation, is 55 to 60%. The left ventricle has normal function. The left ventricle demonstrates regional wall motion abnormalities. Mild hypokinesis of the left ventricular, basal lateral wall and  inferolateral wall. The left ventricular internal cavity size was normal in size. There is moderate concentric left ventricular hypertrophy. Right Ventricle: The right ventricular size is normal. No increase in right ventricular wall thickness. Right ventricular systolic function is normal. Left Atrium: Left atrial size was mild to moderately dilated. Right Atrium: Right atrial size was not well visualized. Pericardium: There is no evidence of pericardial effusion. Mitral Valve: The mitral valve is normal in structure. Mild mitral annular calcification. No evidence of mitral valve stenosis. Tricuspid Valve: The tricuspid valve is grossly normal. Aortic Valve: Aortic valve mean gradient measures 4.0 mmHg. Aortic valve peak gradient measures 7.4 mmHg. Aortic valve area, by VTI measures 2.87 cm. Pulmonic Valve: The pulmonic valve was not well visualized. Aorta: The aortic root is normal in size and structure. LEFT VENTRICLE PLAX 2D LVOT diam:     2.20 cm LV SV:         82 LV SV Index:   44 LVOT Area:     3.80 cm  AORTIC VALVE AV  Area (Vmax):    2.93 cm AV Area (Vmean):   2.86 cm AV Area (VTI):     2.87 cm AV Vmax:           136.00 cm/s AV Vmean:          89.500 cm/s AV VTI:            0.285 m AV Peak Grad:      7.4 mmHg AV Mean Grad:      4.0 mmHg LVOT Vmax:         105.00 cm/s LVOT Vmean:        67.300 cm/s LVOT VTI:          0.215 m LVOT/AV VTI ratio: 0.75  SHUNTS Systemic VTI:  0.22 m Systemic Diam: 2.20 cm Sanda Klein MD Electronically signed by Sanda Klein MD Signature Date/Time: 08/01/2022/2:27:14 PM    Final    Structural Heart Procedure  Result Date: 08/01/2022 See surgical note for result.  DG Chest 2 View  Result Date: 07/28/2022 CLINICAL DATA:  Aortic stenosis, COPD, medical clearance for surgical intervention EXAM: CHEST - 2 VIEW COMPARISON:  03/17/2020 FINDINGS: The heart size and mediastinal contours are within normal limits. Both lungs are clear. The visualized skeletal structures are unremarkable. Surgical clips are seen within the right axilla. IMPRESSION: No active cardiopulmonary disease. Electronically Signed   By: Fidela Salisbury M.D.   On: 07/28/2022 23:28    Microbiology: No results found for this or any previous visit (from the past 240 hour(s)).   Labs: Basic Metabolic Panel: Recent Labs  Lab 08/07/22 0723 08/09/22 0312 08/10/22 0431  NA 139 138 138  K 3.4* 3.5 3.3*  CL 106 105 104  CO2 '24 26 25  '$ GLUCOSE 94 98 94  BUN '11 17 16  '$ CREATININE 0.69 0.75 0.70  CALCIUM 9.2 9.3 9.3  MG 2.0  --  2.1   Liver Function Tests: Recent Labs  Lab 08/07/22 0723  AST 23  ALT 18  ALKPHOS 58  BILITOT 0.8  PROT 6.8  ALBUMIN 3.5   No results for input(s): "LIPASE", "AMYLASE" in the last 168 hours. No results for input(s): "AMMONIA" in the last 168 hours. CBC: Recent Labs  Lab 08/07/22 0723 08/09/22 0312 08/10/22 0431  WBC 8.5 8.3 8.6  NEUTROABS 6.0  --   --   HGB 12.8* 12.4* 12.8*  HCT 37.2* 36.6* 37.2*  MCV 88.8 88.6 88.4  PLT 142* 169 176   Cardiac Enzymes: No results for  input(s): "CKTOTAL", "CKMB", "CKMBINDEX", "TROPONINI" in the last 168 hours. BNP: BNP (last 3 results) No results for input(s): "BNP" in the last 8760 hours.  ProBNP (last 3 results) No results for input(s): "PROBNP" in the last 8760 hours.  CBG: No results for input(s): "GLUCAP" in the last 168 hours.     Signed:  Irine Seal MD.  [facility] 08/10/2022, 2:41 PM
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
## 2022-08-10 NOTE — Care Management Important Message (Signed)
Important Message  Patient Details  Name: Mark Zamora MRN: [id number] Date of Birth: [birth year]   Medicare Important Message Given:  Yes     Alvie Speltz Montine Circle 08/10/2022, 3:19 PM

## 2022-08-10 NOTE — TOC Transition Note (Signed)
Transition of Care [facility]) - CM/SW Discharge Note   Patient Details  Name: Mark Zamora MRN: [id number] Date of Birth: [birth year]  [facility] CM/SW Contact:  Verdell Carmine, RN Phone Number: 08/10/2022, 3:34 PM   Clinical Narrative:    Patient will go home with [facility] PT with [facility], he is refusing all other [place]. Heis wife is in a ASL that cost 6000 a month and he does not have the funds to pay for different disciplines of Home Health. Cory from [employer] with discharge. All information on AVS   Final next level of care: [place] Barriers to Discharge: No Barriers Identified   Patient Goals and CMS Choice     Choice offered to / list presented to : Adult Children, Patient  Discharge Placement                       Discharge Plan and Services In-house Referral: NA Discharge Planning Services: CM Consult Post Acute Care Choice: Home Health          DME Arranged: N/A         HH Arranged: PT HH Agency: [facility] Agency Contacted: 08/08/22 Time HH Agency Contacted: 1000 Representative spoke with at [place]: Adela Lank  Social Determinants of Health (SDOH) Interventions     Readmission Risk Interventions     No data to display

## 2022-08-10 NOTE — Progress Notes (Signed)
Nutrition Education Note  RD consulted for nutrition education regarding a Heart Healthy diet.   Lipid Panel     Component Value Date/Time   CHOL 125 08/08/2022 0300   TRIG 66 08/08/2022 0300   HDL 37 (L) 08/08/2022 0300   CHOLHDL 3.4 08/08/2022 0300   VLDL 13 08/08/2022 0300   LDLCALC 75 08/08/2022 0300    RD attached "Stroke Nutrition Therapy" handout from the Academy of [employer] to discharge instructions.   Body mass index is 30.35 kg/m. Pt meets criteria for obesity based on current BMI.  Current diet order is heart healthy, meal intakes not recorded at this time. Labs and medications reviewed. Plans for discharge home today. No further nutrition interventions warranted at this time. RD contact information provided. If additional nutrition issues arise, please re-consult RD.  Lucas Mallow RD, LDN, CNSC Please refer to Amion for contact information.

## 2022-08-11 ENCOUNTER — Other Ambulatory Visit (HOSPITAL_COMMUNITY): Payer: Self-pay

## 2022-08-15 DIAGNOSIS — I10 Essential (primary) hypertension: Secondary | ICD-10-CM | POA: Diagnosis not present

## 2022-08-15 DIAGNOSIS — H539 Unspecified visual disturbance: Secondary | ICD-10-CM | POA: Diagnosis not present

## 2022-08-15 DIAGNOSIS — J449 Chronic obstructive pulmonary disease, unspecified: Secondary | ICD-10-CM | POA: Diagnosis not present

## 2022-08-15 DIAGNOSIS — G4733 Obstructive sleep apnea (adult) (pediatric): Secondary | ICD-10-CM | POA: Diagnosis not present

## 2022-08-15 DIAGNOSIS — Z48812 Encounter for surgical aftercare following surgery on the circulatory system: Secondary | ICD-10-CM | POA: Diagnosis not present

## 2022-08-15 DIAGNOSIS — I110 Hypertensive heart disease with heart failure: Secondary | ICD-10-CM | POA: Diagnosis not present

## 2022-08-15 DIAGNOSIS — I69398 Other sequelae of cerebral infarction: Secondary | ICD-10-CM | POA: Diagnosis not present

## 2022-08-15 DIAGNOSIS — E782 Mixed hyperlipidemia: Secondary | ICD-10-CM | POA: Diagnosis not present

## 2022-08-17 DIAGNOSIS — H539 Unspecified visual disturbance: Secondary | ICD-10-CM | POA: Diagnosis not present

## 2022-08-17 DIAGNOSIS — J449 Chronic obstructive pulmonary disease, unspecified: Secondary | ICD-10-CM | POA: Diagnosis not present

## 2022-08-17 DIAGNOSIS — Z48812 Encounter for surgical aftercare following surgery on the circulatory system: Secondary | ICD-10-CM | POA: Diagnosis not present

## 2022-08-17 DIAGNOSIS — I69398 Other sequelae of cerebral infarction: Secondary | ICD-10-CM | POA: Diagnosis not present

## 2022-08-17 DIAGNOSIS — I110 Hypertensive heart disease with heart failure: Secondary | ICD-10-CM | POA: Diagnosis not present

## 2022-08-21 DIAGNOSIS — H5319 Other subjective visual disturbances: Secondary | ICD-10-CM | POA: Diagnosis not present

## 2022-08-21 DIAGNOSIS — H3401 Transient retinal artery occlusion, right eye: Secondary | ICD-10-CM | POA: Diagnosis not present

## 2022-08-21 DIAGNOSIS — H26491 Other secondary cataract, right eye: Secondary | ICD-10-CM | POA: Diagnosis not present

## 2022-08-21 DIAGNOSIS — H348112 Central retinal vein occlusion, right eye, stable: Secondary | ICD-10-CM | POA: Diagnosis not present

## 2022-08-22 DIAGNOSIS — Z48812 Encounter for surgical aftercare following surgery on the circulatory system: Secondary | ICD-10-CM | POA: Diagnosis not present

## 2022-08-22 DIAGNOSIS — H539 Unspecified visual disturbance: Secondary | ICD-10-CM | POA: Diagnosis not present

## 2022-08-22 DIAGNOSIS — I69398 Other sequelae of cerebral infarction: Secondary | ICD-10-CM | POA: Diagnosis not present

## 2022-08-22 DIAGNOSIS — I110 Hypertensive heart disease with heart failure: Secondary | ICD-10-CM | POA: Diagnosis not present

## 2022-08-22 DIAGNOSIS — J449 Chronic obstructive pulmonary disease, unspecified: Secondary | ICD-10-CM | POA: Diagnosis not present

## 2022-08-24 DIAGNOSIS — H539 Unspecified visual disturbance: Secondary | ICD-10-CM | POA: Diagnosis not present

## 2022-08-24 DIAGNOSIS — I110 Hypertensive heart disease with heart failure: Secondary | ICD-10-CM | POA: Diagnosis not present

## 2022-08-24 DIAGNOSIS — Z48812 Encounter for surgical aftercare following surgery on the circulatory system: Secondary | ICD-10-CM | POA: Diagnosis not present

## 2022-08-24 DIAGNOSIS — I69398 Other sequelae of cerebral infarction: Secondary | ICD-10-CM | POA: Diagnosis not present

## 2022-08-24 DIAGNOSIS — J449 Chronic obstructive pulmonary disease, unspecified: Secondary | ICD-10-CM | POA: Diagnosis not present

## 2022-08-29 DIAGNOSIS — I69398 Other sequelae of cerebral infarction: Secondary | ICD-10-CM | POA: Diagnosis not present

## 2022-08-29 DIAGNOSIS — I110 Hypertensive heart disease with heart failure: Secondary | ICD-10-CM | POA: Diagnosis not present

## 2022-08-29 DIAGNOSIS — J449 Chronic obstructive pulmonary disease, unspecified: Secondary | ICD-10-CM | POA: Diagnosis not present

## 2022-08-29 DIAGNOSIS — Z48812 Encounter for surgical aftercare following surgery on the circulatory system: Secondary | ICD-10-CM | POA: Diagnosis not present

## 2022-08-29 DIAGNOSIS — H539 Unspecified visual disturbance: Secondary | ICD-10-CM | POA: Diagnosis not present

## 2022-08-29 NOTE — Progress Notes (Unsigned)
[facility] [place]                                     Cardiology Office Note:    Date:  08/30/2022   ID:  Mark Zamora, DOB 30-May-1946, MRN [id number]  PCP:  Maury Dus, MD  [facility] Cardiologist:  Early Osmond, MD/ Dr. Cyndia Bent, MD (TAVR) [facility] Electrophysiologist:  None   Referring MD: Maury Dus, MD   Chief Complaint  Patient presents with   Follow-up    1 month s/p TAVR   History of Present Illness:    Mark Zamora is a 76 y.o. 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   [name] was initially referred to the structural heart team with a 6 month hx of increasing shortness of breath with activities. He was previously the primary caregiver for his wife who is suffering from dementia however recently had to move her into an assisted living center because he could not care for her any longer. Echocardiogram from 05/17/2022 showed EF 55-60%, severe AS with mean grad 38 mmHg, peak grad 59.9 mmHg, AVA 0.62cm2, DVI 0.20, SVI 35. L/RHC 05/31/2022 showed mild nonobstructive coronary disease.  He was evaluated by the multidisciplinary valve team and underwent successful TAVR with a 29 mm Edwards Sapien 3 Ultra Resilia THV via the TF approach on 08/01/22 by Dr. Ali Lowe and Dr. Cyndia Bent. Post operative echo showed EF 55%, normally functioning TAVR with a mean gradient of 7 mmHg and no PVL. He had post op urinary retention and N/V but recovered and was discharged home on 08/03/22 on ASA '81mg'$  QD.     Unfortunately he was readmitted 08/06/22 with sudden unilateral vision changes and right eye visual problems starting on day of ED presentation. Head CT showed cerebellar hypodensities suggestive of infarctions. MRI of the brain with acute infarct in the bilateral cerebellar hemispheres affecting the right PICA, bilateral superior  cerebellar artery territory, and inferior right cerebellar infarct associated with petechial hemorrhage. 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   Structural heart team evaluated the patient and repeat echocardiogram showed stable valve placement and PVL. He was stabilized and eventually discharged 08/10/22 to his home.   He is here today for post TAVR follow up and is doing surprisingly well. He is back home and getting back to himself. He continues to have unilateral blurry vision in his right eye however no other residual symptoms from his stroke. He has follow up with neurology soon. He was initially placed on ASA '81mg'$  and Plavix '75mg'$   for 21 days with a plan to transition to ASA '325mg'$  QD thereafter. We will follow neurology's recommendations on antiplatelet regimen. He denies chest pain, SOB, fatigue, LE edema, palpitations, orthopnea, dizziness, bleeding in stool or urine, or syncope. He is back to doing house chores with no real difficulty. Groin sites have healed nicely. Echocardiogram today shows stable valve placement 18m S3UR mean gradient 8.518mg and AVA at 2.83cm2.    Past Medical History:  Diagnosis Date   AAA (abdominal aortic aneurysm) ([place]03/03/2017   2.9cm   Allergic rhinitis    Anxiety    Arthritis    l ankle  BPH (benign prostatic hypertrophy)    Cancer (HCC)    HX SKIN CANCER   Cataract, bilateral    Chronic back pain    Chronic left hip pain    Constipation    COPD (chronic obstructive pulmonary disease) ([place]) 2020   Depression    Essential hypertension    GERD (gastroesophageal reflux disease)    History of chronic sinusitis    History of skin cancer    Hyperlipidemia    Insomnia    Neck pain, chronic    Nocturia    OSA on CPAP    uses cpap   Pedal edema    Chronic   Postherpetic neuralgia    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 Ali Lowe & Dr. Lavonna Monarch   Severe aortic stenosis    Shingles    Spinal stenosis     Past Surgical History:  Procedure Laterality Date   ABDOMINAL AORTOGRAM N/A 05/31/2022   Procedure: ABDOMINAL AORTOGRAM;  Surgeon: Early Osmond, MD;  Location: [place] [facility];  Service: Cardiovascular;  Laterality: N/A;   ANKLE ARTHROSCOPY Left 01/2011   CATARACT EXTRACTION, BILATERAL     COLONOSCOPY     INTRAOPERATIVE TRANSTHORACIC ECHOCARDIOGRAM N/A 08/01/2022   Procedure: INTRAOPERATIVE TRANSTHORACIC ECHOCARDIOGRAM;  Surgeon: Early Osmond, MD;  Location: [place];  Service: Open Heart Surgery;  Laterality: N/A;   KNEE ARTHROSCOPY Left 04/19/2011   KNEE ARTHROSCOPY WITH MEDIAL MENISECTOMY Right 05/18/2015   Procedure: RIGHT KNEE ARTHROSCOPY WITH  MEDIAL MENISCAL DEBRIDEMENT;  Surgeon: Gaynelle Arabian, MD;  Location: [place];  Service: Orthopedics;  Laterality: Right;   pilonidal fistulectomy     RIGHT HEART CATH AND CORONARY ANGIOGRAPHY N/A 05/31/2022   Procedure: RIGHT HEART CATH AND CORONARY ANGIOGRAPHY;  Surgeon: Early Osmond, MD;  Location: [place] [facility];  Service: Cardiovascular;  Laterality: N/A;   TONSILLECTOMY     pt reports 1/2 still present   TOTAL HIP ARTHROPLASTY Left 12/12/2017   Procedure: LEFT TOTAL HIP ARTHROPLASTY ANTERIOR APPROACH;  Surgeon: Gaynelle Arabian, MD;  Location: [facility];  Service: Orthopedics;  Laterality: Left;   TRANSCATHETER AORTIC VALVE REPLACEMENT, TRANSFEMORAL Left 08/01/2022   Procedure: Transcatheter Aortic Valve Replacement, Transfemoral using Edwards 29 MM SAPIEN 3 Ultra;  Surgeon: Early Osmond, MD;  Location: [place];  Service: Open Heart Surgery;  Laterality: Left;  Transfemoral valve insertion.   TRANSURETHRAL RESECTION OF PROSTATE     TRANSURETHRAL RESECTION OF PROSTATE N/A 06/25/2015   Procedure: TRANSURETHRAL RESECTION OF THE PROSTATE WITH GYRUS INSTRUMENTS BLADDER BIOPSY AND FULGURATION;   Surgeon: Cleon Gustin, MD;  Location: [facility];  Service: Urology;  Laterality: N/A;    Current Medications: Current Meds  Medication Sig   acetaminophen (TYLENOL) 650 MG CR tablet Take 1,300 mg by mouth every 8 (eight) hours as needed for pain.   albuterol (VENTOLIN HFA) 108 (90 Base) MCG/ACT inhaler Inhale 1-2 puffs into the lungs every 6 (six) hours as needed for shortness of breath or wheezing.   amLODipine (NORVASC) 5 MG tablet Take 5 mg by mouth daily.   amoxicillin (AMOXIL) 500 MG tablet Take 4 tablets (2,000 mg total) by mouth as directed. 1 HOUR PRIOR TO DENTAL APPOINTMENTS   [START ON 09/01/2022] aspirin (BAYER ASPIRIN) 325 MG tablet Take 1 tablet (325 mg total) by mouth daily.   aspirin EC 81 MG tablet Take 1 tablet (81 mg  total) by mouth daily for 21 days. Swallow whole.   budesonide-formoterol (SYMBICORT) 160-4.5 MCG/ACT inhaler Inhale 2 puffs into the lungs 2 (two) times daily.   Cholecalciferol (VITAMIN D3) 50 MCG (2000 UT) capsule Take 2,000 Units by mouth daily.   clopidogrel (PLAVIX) 75 MG tablet Take 1 tablet (75 mg total) by mouth daily for 21 days.   diclofenac (VOLTAREN) 75 MG EC tablet Take 75 mg by mouth 2 (two) times daily.   doxazosin (CARDURA) 8 MG tablet Take 8 mg by mouth at bedtime.   famotidine (PEPCID) 40 MG tablet Take 40 mg by mouth every evening.   fluticasone (FLONASE) 50 MCG/ACT nasal spray Place 2 sprays into both nostrils at bedtime.   guaiFENesin (MUCINEX PO) Take 1 tablet by mouth at bedtime.   lisinopril (ZESTRIL) 40 MG tablet Take 1 tablet (40 mg total) by mouth daily.   Melatonin 10 MG TABS Take 20 mg by mouth at bedtime.   Multiple Vitamin (MULTIVITAMIN) capsule Take 1 capsule by mouth daily.   NON FORMULARY Pt uses a cpap nightly   pantoprazole (PROTONIX) 40 MG tablet Take 1 tablet (40 mg total) by mouth daily.   potassium chloride SA (K-DUR,KLOR-CON) 20 MEQ tablet Take 20 mEq by mouth daily.    Psyllium (METAMUCIL) 28.3 % POWD Take 1 Scoop  by mouth daily.   rosuvastatin (CRESTOR) 10 MG tablet Take 1 tablet (10 mg total) by mouth daily.   spironolactone (ALDACTONE) 25 MG tablet Take 0.5 tablets (12.5 mg total) by mouth daily. Take 6.25 mg by mouth daily.   tiZANidine (ZANAFLEX) 4 MG capsule Take 1 capsule (4 mg total) by mouth 3 (three) times daily.   traMADol (ULTRAM) 50 MG tablet Take 1-2 tablets (50-100 mg total) by mouth every 6 (six) hours as needed (mild pain).   vitamin E 180 MG (400 UNITS) capsule Take 400 Units by mouth daily.   zolpidem (AMBIEN CR) 12.5 MG CR tablet Take 12.5 mg by mouth at bedtime.   [DISCONTINUED] Dextromethorphan-guaiFENesin (MUCINEX DM MAXIMUM STRENGTH) 60-1200 MG TB12 Take 1 tablet by mouth at bedtime.     Allergies:   Compazine [prochlorperazine edisylate], Cymbalta [duloxetine hcl], Promethazine, Codeine, and Other   Social History   Socioeconomic History   Marital status: Married    Spouse name: Butch Penny   Number of children: 2   Years of education: Not on file   Highest education level: Not on file  Occupational History   Not on file  Tobacco Use   Smoking status: Former    Types: Cigarettes    Quit date: 05/10/1994    Years since quitting: 28.3   Smokeless tobacco: Never  Vaping Use   Vaping Use: Never used  Substance and Sexual Activity   Alcohol use: Yes    Alcohol/week: 3.0 standard drinks of alcohol    Types: 3 Shots of liquor per week    Comment: 7 DRINKS PER WEEK   Drug use: No   Sexual activity: Not on file  Other Topics Concern   Not on file  Social History Narrative   Not on file   Social Determinants of Health   Financial Resource Strain: Not on file  Food Insecurity: Not on file  Transportation Needs: Not on file  Physical Activity: Not on file  Stress: Not on file  Social Connections: Not on file     Family History: The patient's family history includes CVA in his father; Hypercholesterolemia in his mother; Seizures in his father; Stroke in  his  mother.  ROS:   Please see the history of present illness.    All other systems reviewed and are negative.  EKGs/Labs/Other Studies Reviewed:    The following studies were reviewed today:  Echocardiogram 08/30/22:   1. Left ventricular ejection fraction, by estimation, is 55 to 60%. The  left ventricle has normal function. The left ventricle has no regional  wall motion abnormalities. Left ventricular diastolic parameters are  indeterminate.   2. Right ventricular systolic function is normal. The right ventricular  size is normal. Tricuspid regurgitation signal is inadequate for assessing  PA pressure.   3. Left atrial size was mild to moderately dilated.   4. The mitral valve is normal in structure. No evidence of mitral valve  regurgitation. No evidence of mitral stenosis.   5. The aortic valve has been repaired/replaced. Aortic valve  regurgitation is not visualized. There is a 29 mm Edwards Sapien  prosthetic (TAVR) valve present in the aortic position. Procedure Date:  08/01/2022. Echo findings are consistent with normal  structure and function of the aortic valve prosthesis. Aortic valve area,  by VTI measures 2.83 cm. Aortic valve mean gradient measures 8.5 mmHg.  Aortic valve Vmax measures 2.19 m/s.   6. Aortic dilatation noted. There is borderline dilatation of the  ascending aorta, measuring 38 mm.   7. The inferior vena cava is normal in size with greater than 50%  respiratory variability, suggesting right atrial pressure of 3 mmHg.   Comparison(s): No significant change from prior study.   Conclusion(s)/Recommendation(s): S/P TAVR. Valve is stable with  measurements as listed.    Echo 08/07/22 IMPRESSIONS  1. Left ventricular ejection fraction, by estimation, is 55 to 60%. The  left ventricle has normal function. The left ventricle has no regional  wall motion abnormalities. Left ventricular diastolic parameters are  consistent with Grade I diastolic   dysfunction (impaired relaxation).   2. Right ventricular systolic function is normal. The right ventricular  size is normal. Tricuspid regurgitation signal is inadequate for assessing  PA pressure.   3. The mitral valve is normal in structure. No evidence of mitral valve  regurgitation. No evidence of mitral stenosis.   4. 29 mm Sapien Valve without no PVL or evidence of prosthetic stenosis..  The aortic valve has been repaired/replaced. Aortic valve regurgitation is  not visualized. No aortic stenosis is present.   5. The inferior vena cava is normal in size with greater than 50%  respiratory variability, suggesting right atrial pressure of 3 mmHg.   Comparison(s): No significant change from prior study.   TAVR OPERATIVE NOTE     Date of Procedure:                08/01/22   Preoperative Diagnosis:      Severe Aortic Stenosis    Postoperative Diagnosis:    Same    Procedure:        Transcatheter Aortic Valve Replacement - Percutaneous Right Transfemoral Approach             Edwards Sapien 3 Ultra THV (size 29 mm, model # 9755RSL serial # [id number])              Co-Surgeons:            Coralie Common MD  and Lenna Sciara, MD     Anesthesiologist:                  Rochele Pages MD   Echocardiographer:  Mihai Croitoru MD   Pre-operative Echo Findings: Severe aortic stenosis Normal  left ventricular systolic function   Post-operative Echo Findings: Np paravalvular leak Normal left ventricular systolic function   ____________   Echo 08/02/22:  IMPRESSIONS   1. Left ventricular ejection fraction, by estimation, is 55 to 60%. The  left ventricle has normal function. There is moderate concentric left  ventricular hypertrophy.   2. Right ventricular systolic function is normal. The right ventricular  size is normal. Tricuspid regurgitation signal is inadequate for assessing  PA pressure.   3. Left atrial size was mild to moderately dilated.   4. Right  atrial size was mildly dilated.   5. The mitral valve is normal in structure. No evidence of mitral valve  regurgitation. No evidence of mitral stenosis.   6. There is a 29 mm Edwards Sapien 3 Ultra TAVR valve present in the  aortic position. Procedure Date: 08/01/22. Echo findings are consistent with  normal structure and function of the aortic valve prosthesis. Aortic valve  area, by VTI measures 3.16 cm.  Aortic valve Vmax measures 1.86 m/s. Aortic valve acceleration time  measures 46 msec.     EKG:  EKG is not ordered today.    Recent Labs: 08/07/2022: ALT 18 08/10/2022: BUN 16; Creatinine, Ser 0.70; Hemoglobin 12.8; Magnesium 2.1; Platelets 176; Potassium 3.3; Sodium 138   Recent Lipid Panel    Component Value Date/Time   CHOL 125 08/08/2022 0300   TRIG 66 08/08/2022 0300   HDL 37 (L) 08/08/2022 0300   CHOLHDL 3.4 08/08/2022 0300   VLDL 13 08/08/2022 0300   LDLCALC 75 08/08/2022 0300   Physical Exam:    VS:  BP (!) 142/92   Pulse (!) 50   Ht '5\' 4"'$  (1.626 m)   Wt 180 lb (81.6 kg)   SpO2 98%   BMI 30.90 kg/m     Wt Readings from Last 3 Encounters:  08/30/22 180 lb (81.6 kg)  08/06/22 176 lb 12.9 oz (80.2 kg)  08/03/22 176 lb 12.8 oz (80.2 kg)    General: Well developed, well nourished, NAD Neck: Negative for carotid bruits. No JVD Lungs:Clear to ausculation bilaterally. No wheezes, rales, or rhonchi. Breathing is unlabored. Cardiovascular: RRR with S1 S2. No murmurs Extremities: No edema.  Neuro: Alert and oriented. No focal deficits. No facial asymmetry. MAE spontaneously. Psych: Responds to questions appropriately with normal affect.    ASSESSMENT/PLAN:   Severe AS: s/p successful TAVR with a 29 mm Edwards Sapien 3 Ultra Resilia THV via the TF approach on 08/01/22. Post operative echo showed EF 55%, normally functioning TAVR with a mean gradient of 7 mmHg and no PVL. Continues to do very well with NYHA class I symptoms despite recent CVA. He continues to have  unilateral blurry vision in his right eye however no other residual symptoms from his stroke. He has follow up with neurology soon. He was initially placed on ASA '81mg'$  and Plavix '75mg'$  for 21 days with a plan to transition to ASA '325mg'$  QD thereafter. We will follow neurology's recommendations on antiplatelet regimen. Echocardiogram today shows stable valve placement 76m S3UR mean gradient 8.551mg and AVA at 2.83cm2. Will have him follow with primary cardiology then our team in one year with an echocardiogram. Continue ASA '325mg'$  QD for now. Clarify duration with neurology. Only needs to be on ASA '81mg'$  from a valve standpoint. Needs lifelong dental SBE. RX'ed amoxicillin to preferred pharmacy.   Recent CVA: Felt to be cardioembolic source due  to aortic valve manipulation in the setting of TAVR. He was discharged on ASA 81 and Plavix 75 for 21 days then to transition to ASA '325mg'$  QD. Will leave antiplatelet regimen up to neurology for now. Likely to be on long term ASA '81mg'$ .      HTN: BP a little elevated today however patient reports improved home readings. He has room to titrate as needed.   Hypokalemia: K 3.3 at most recent hospital discharge. He was resumed on Kdur 28mq daily and SArlyce Harman He refuses repeat labs today for reassessment. Wishes to have them drawn with PCP.   Medication Adjustments/Labs and Tests Ordered: Current medicines are reviewed at length with the patient today.  Concerns regarding medicines are outlined above.  No orders of the defined types were placed in this encounter.  Meds ordered this encounter  Medications   amoxicillin (AMOXIL) 500 MG tablet    Sig: Take 4 tablets (2,000 mg total) by mouth as directed. 1 HOUR PRIOR TO DENTAL APPOINTMENTS    Dispense:  12 tablet    Refill:  6    Patient Instructions  Medication Instructions:  Your physician has recommended you make the following change in your medication:  START AMOXICILLIN 500 MG - TAKE 4 TABLETS (2000 MG) 1 HOUR  PRIOR TO DENTAL CLEANINGS AND PROCEDURES.   *If you need a refill on your cardiac medications before your next appointment, please call your pharmacy*   Lab Work: NONE If you have labs (blood work) drawn today and your tests are completely normal, you will receive your results only by: [place](if you have MyChart) OR A paper copy in the mail If you have any lab test that is abnormal or we need to change your treatment, we will call you to review the results.   Testing/Procedures: NONE   Follow-Up: [facility] and your health needs are our priority.  As part of our continuing mission to provide you with exceptional heart care, we have created designated Provider Care Teams.  These Care Teams include your primary Cardiologist (physician) and Advanced Practice Providers (APPs -  Physician Assistants and Nurse Practitioners) who all work together to provide you with the care you need, when you need it.  We recommend signing up for the patient portal called "MyChart".  Sign up information is provided on this After Visit Summary.  MyChart is used to connect with patients for Virtual Visits (Telemedicine).  Patients are able to view lab/test results, encounter notes, upcoming appointments, etc.  Non-urgent messages can be sent to your provider as well.   To learn more about what you can do with MyChart, go to [email or web address]    Your next appointment:   5 month(s)  The format for your next appointment:   In Person  Provider:   ALenna Sciara MD   Important Information About Sugar         Signed, JKathyrn Drown NP  08/30/2022 2:09 PM    [place]

## 2022-08-30 ENCOUNTER — Ambulatory Visit: Payer: Medicare Other | Attending: Cardiology | Admitting: Cardiology

## 2022-08-30 ENCOUNTER — Other Ambulatory Visit: Payer: Self-pay | Admitting: Cardiology

## 2022-08-30 ENCOUNTER — Ambulatory Visit (HOSPITAL_BASED_OUTPATIENT_CLINIC_OR_DEPARTMENT_OTHER): Payer: Medicare Other

## 2022-08-30 VITALS — BP 142/92 | HR 50 | Ht 64.0 in | Wt 180.0 lb

## 2022-08-30 DIAGNOSIS — E785 Hyperlipidemia, unspecified: Secondary | ICD-10-CM | POA: Diagnosis not present

## 2022-08-30 DIAGNOSIS — I10 Essential (primary) hypertension: Secondary | ICD-10-CM | POA: Diagnosis not present

## 2022-08-30 DIAGNOSIS — Z952 Presence of prosthetic heart valve: Secondary | ICD-10-CM | POA: Insufficient documentation

## 2022-08-30 DIAGNOSIS — I350 Nonrheumatic aortic (valve) stenosis: Secondary | ICD-10-CM

## 2022-08-30 DIAGNOSIS — I639 Cerebral infarction, unspecified: Secondary | ICD-10-CM | POA: Insufficient documentation

## 2022-08-30 LAB — ECHOCARDIOGRAM COMPLETE
AR max vel: 2.64 cm2
AV Area VTI: 2.83 cm2
AV Area mean vel: 2.8 cm2
AV Mean grad: 8.5 mmHg
AV Peak grad: 19.2 mmHg
Ao pk vel: 2.19 m/s
Area-P 1/2: 1.89 cm2
S' Lateral: 3.3 cm

## 2022-08-30 MED ORDER — AMOXICILLIN 500 MG PO TABS: 2000.0000 mg | ORAL_TABLET | ORAL | 6 refills | Status: DC

## 2022-08-30 NOTE — Patient Instructions (Signed)
Medication Instructions:  Your physician has recommended you make the following change in your medication:  START AMOXICILLIN 500 MG - TAKE 4 TABLETS (2000 MG) 1 HOUR PRIOR TO DENTAL CLEANINGS AND PROCEDURES.   *If you need a refill on your cardiac medications before your next appointment, please call your pharmacy*   Lab Work: NONE If you have labs (blood work) drawn today and your tests are completely normal, you will receive your results only by: [place] (if you have MyChart) OR A paper copy in the mail If you have any lab test that is abnormal or we need to change your treatment, we will call you to review the results.   Testing/Procedures: NONE   Follow-Up: At [facility], you and your health needs are our priority.  As part of our continuing mission to provide you with exceptional heart care, we have created designated Provider Care Teams.  These Care Teams include your primary Cardiologist (physician) and Advanced Practice Providers (APPs -  Physician Assistants and Nurse Practitioners) who all work together to provide you with the care you need, when you need it.  We recommend signing up for the patient portal called "MyChart".  Sign up information is provided on this After Visit Summary.  MyChart is used to connect with patients for Virtual Visits (Telemedicine).  Patients are able to view lab/test results, encounter notes, upcoming appointments, etc.  Non-urgent messages can be sent to your provider as well.   To learn more about what you can do with MyChart, go to [email or web address].    Your next appointment:   5 month(s)  The format for your next appointment:   In Person  Provider:   Lenna Sciara, MD   Important Information About Sugar

## 2022-08-31 MED ORDER — ROSUVASTATIN CALCIUM 10 MG PO TABS: 10.0000 mg | ORAL_TABLET | Freq: Every day | ORAL | 1 refills | Status: DC

## 2022-08-31 MED ORDER — PANTOPRAZOLE SODIUM 40 MG PO TBEC: 40.0000 mg | DELAYED_RELEASE_TABLET | Freq: Every day | ORAL | 1 refills | Status: DC

## 2022-08-31 NOTE — Addendum Note (Signed)
Addended by: Kathyrn Drown D on: 08/31/2022 12:59 PM   Modules accepted: Orders

## 2022-09-04 DIAGNOSIS — M48062 Spinal stenosis, lumbar region with neurogenic claudication: Secondary | ICD-10-CM | POA: Diagnosis not present

## 2022-09-04 DIAGNOSIS — M5416 Radiculopathy, lumbar region: Secondary | ICD-10-CM | POA: Diagnosis not present

## 2022-09-05 ENCOUNTER — Telehealth: Payer: Self-pay | Admitting: Cardiology

## 2022-09-06 ENCOUNTER — Ambulatory Visit: Payer: Medicare Other | Admitting: Neurology

## 2022-09-06 ENCOUNTER — Encounter: Payer: Self-pay | Admitting: Neurology

## 2022-09-06 VITALS — BP 107/70 | HR 58 | Ht 64.0 in | Wt 166.0 lb

## 2022-09-06 DIAGNOSIS — G8929 Other chronic pain: Secondary | ICD-10-CM

## 2022-09-06 DIAGNOSIS — M5442 Lumbago with sciatica, left side: Secondary | ICD-10-CM

## 2022-09-06 DIAGNOSIS — I639 Cerebral infarction, unspecified: Secondary | ICD-10-CM | POA: Diagnosis not present

## 2022-09-06 DIAGNOSIS — R269 Unspecified abnormalities of gait and mobility: Secondary | ICD-10-CM

## 2022-09-06 DIAGNOSIS — M5441 Lumbago with sciatica, right side: Secondary | ICD-10-CM

## 2022-09-06 NOTE — Progress Notes (Signed)
Chief Complaint  Patient presents with   New Patient (Initial Visit)    Rm 14. Alone. NP ED referral for CVA f/u.      ASSESSMENT AND PLAN  Mark Zamora is a 76 y.o. 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  Echocardiogram showed post aortic valve replacement, no significant abnormality  Laboratory evaluations, LDL 75, Crestor 10  mg daily,  Is now on higher dose of aspirin 325 mg plus Plavix 75 mg daily  Mechanism of cerebellum stroke can be multiple vessel thrombosis versus embolic events due to recent aortic valve replacement,  Advised patient to call clinic for worsening headaches,  He is under close supervision of cardiologist, will keep current dual antiplatelet agent treatment, per recommendation by cardiologist  DIAGNOSTIC DATA (LABS, IMAGING, TESTING) - I reviewed patient records, labs, notes, testing and imaging myself where available.   MEDICAL HISTORY:  ABISHAI VIEGAS is a 76 year old male, seen in request by his primary care physician Dr. Irine Seal to follow-up for stroke, initial evaluation September 06, 2022  I reviewed and summarized the referring note. PMHX. HTN Chronic low back pain,  Chronic insomnia Smoke in past. COPD  Patient lives with his wife at home, he is a caregiver of his wife who suffered dementia, he had TAVR on August 01, 2022 for severe aortic stenosis, was discharged with aspirin 81 mg plus Plavix 75 mg daily, on August 06, 2022, he developed terrible left-sided headache, he saw black and pink dot on the wall, also had difficulty walking,  Presented to to [facility], later transferred to [facility],  Personally reviewed MRI of the brain, acute infarction at bilateral cerebellar hemisphere, in the territory of right PICA, bilateral superior cerebellar artery, there was also petechiae hemorrhage at the right cerebellar infarction, local mass effect in the posterior fossa with sulcal effacement of the fourth ventricle without evidence of hydrocephalus  Bilateral orbits showed no evidence of acute process  Echocardiogram ejection fraction 55 to 60% no source of emboli noticed  CT angiogram of head and neck showed mild atherosclerosis in the head and neck without large vessel disease  He continues aspirin 81 mg plus Plavix 75 mg now, mild gait abnormality, contributed to his long chronic low back pain, denies significant headache now,  PHYSICAL EXAM:   Vitals:   09/06/22 1013  BP: 107/70  Pulse: (!) 58  Weight: 166 lb (75.3 kg)  Height: '5\' 4"'$  (1.626 m)   Not recorded     Body mass index is 28.49 kg/m.  PHYSICAL EXAMNIATION:  Gen: NAD, conversant, well nourised, well groomed                     Cardiovascular: Regular rate rhythm, no peripheral edema, warm, nontender. Eyes: Conjunctivae clear without exudates or hemorrhage Neck: Supple, no carotid bruits. Pulmonary: Clear to auscultation bilaterally   NEUROLOGICAL EXAM:  MENTAL STATUS: Speech/cognition: Awake, alert, oriented to history taking and casual conversation CRANIAL NERVES: CN II: Visual fields are full to confrontation. Pupils are round equal and briskly reactive to light. CN III, IV, VI: extraocular movement are normal. No ptosis. CN V: Facial sensation is intact to light touch CN VII: Face is  symmetric with normal eye closure  CN VIII: Hearing is normal to causal conversation. CN IX, X: Phonation is normal. CN XI: Head turning and shoulder shrug are intact  MOTOR: Mild left arm weakness, fixation of left arm on rapid rotating movement, mild right ankle dorsiflexion  weakness,  REFLEXES: Reflexes are 2+ and symmetric at the biceps, triceps, knees, and ankles. Plantar responses are flexor.  SENSORY: Intact to light touch, pinprick and vibratory sensation are intact in fingers and toes.  COORDINATION: There is no trunk or limb dysmetria noted.  GAIT/STANCE: Need push-up to get up from sitting position, dragging right leg some, wide-based, cautious, mildly unsteady  REVIEW OF SYSTEMS:  Full 14 system review of systems performed and notable only for as above All other review of systems were negative.   ALLERGIES: Allergies  Allergen Reactions   Compazine [Prochlorperazine Edisylate] Anaphylaxis and Other (See Comments)    Pt states "any nausea med ending in "zine"  Seizures   Cymbalta [Duloxetine Hcl] Other (See Comments)    Urinary retention    Promethazine Anaphylaxis and Other (See Comments)    seizures   Codeine Other (See Comments)    constipation   Other Other (See Comments)    All antiemetic meds ending -zine  Prefers to not take any narcotic pain meds    HOME MEDICATIONS: Current Outpatient Medications  Medication Sig Dispense Refill   acetaminophen (TYLENOL) 650 MG CR tablet Take 1,300 mg by mouth every 8 (eight) hours as needed for pain.     albuterol (VENTOLIN HFA) 108 (90 Base) MCG/ACT inhaler Inhale 1-2 puffs into the lungs every 6 (six) hours as needed for shortness of breath or wheezing.     amLODipine (NORVASC) 5 MG tablet Take 5 mg by mouth daily.     amoxicillin (AMOXIL) 500 MG tablet Take 4 tablets (2,000 mg total) by mouth as directed. 1 HOUR PRIOR TO DENTAL APPOINTMENTS 12 tablet 6   aspirin (BAYER ASPIRIN) 325 MG tablet Take 1 tablet (325 mg total) by mouth daily. 100 tablet 3   budesonide-formoterol (SYMBICORT) 160-4.5 MCG/ACT inhaler Inhale 2 puffs into the lungs 2 (two) times daily.     Cholecalciferol (VITAMIN D3) 50 MCG (2000 UT) capsule Take 2,000 Units by mouth daily.     diclofenac (VOLTAREN) 75 MG EC tablet  Take 75 mg by mouth 2 (two) times daily.     doxazosin (CARDURA) 8 MG tablet Take 8 mg by mouth at bedtime.     famotidine (PEPCID) 40 MG tablet Take 40 mg by mouth every evening.     fluticasone (FLONASE) 50 MCG/ACT nasal spray Place 2 sprays into both nostrils at bedtime.     guaiFENesin (MUCINEX PO) Take 1 tablet by mouth at bedtime.     lisinopril (ZESTRIL) 40 MG tablet Take 1 tablet (40 mg total) by mouth daily.     Melatonin 10 MG TABS Take 20 mg by mouth at bedtime.     Multiple Vitamin (MULTIVITAMIN) capsule Take 1 capsule by mouth daily.     NON FORMULARY Pt uses a cpap nightly     pantoprazole (PROTONIX) 40 MG tablet Take 1 tablet (40 mg total) by mouth daily. 30 tablet 1   potassium chloride SA (K-DUR,KLOR-CON) 20 MEQ tablet Take 20 mEq by mouth daily.      Psyllium (METAMUCIL) 28.3 % POWD Take 1 Scoop by mouth daily.     rosuvastatin (CRESTOR) 10 MG tablet Take 1 tablet (10 mg total) by mouth daily. [place]  tablet 1   spironolactone (ALDACTONE) 25 MG tablet Take 0.5 tablets (12.5 mg total) by mouth daily. Take 6.25 mg by mouth daily.     tiZANidine (ZANAFLEX) 4 MG capsule Take 1 capsule (4 mg total) by mouth 3 (three) times daily. 90 capsule 0   traMADol (ULTRAM) 50 MG tablet Take 1-2 tablets (50-100 mg total) by mouth every 6 (six) hours as needed (mild pain). 56 tablet 0   vitamin E 180 MG (400 UNITS) capsule Take 400 Units by mouth daily.     zolpidem (AMBIEN CR) 12.5 MG CR tablet Take 12.5 mg by mouth at bedtime.     No current facility-administered medications for this visit.    PAST MEDICAL HISTORY: Past Medical History:  Diagnosis Date   AAA (abdominal aortic aneurysm) ([place]) 01/29/2017   2.9cm   Allergic rhinitis    Anxiety    Arthritis    l ankle   BPH (benign prostatic hypertrophy)    Cancer (HCC)    HX SKIN CANCER   Cataract, bilateral    Chronic back pain    Chronic left hip pain    Constipation    COPD (chronic obstructive pulmonary disease) ([place]) 2020    Depression    Essential hypertension    GERD (gastroesophageal reflux disease)    History of chronic sinusitis    History of skin cancer    Hyperlipidemia    Insomnia    Neck pain, chronic    Nocturia    OSA on CPAP    uses cpap   Pedal edema    Chronic   Postherpetic neuralgia    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 Ali Lowe & Dr. Lavonna Monarch   Severe aortic stenosis    Shingles    Spinal stenosis     PAST SURGICAL HISTORY: Past Surgical History:  Procedure Laterality Date   ABDOMINAL AORTOGRAM N/A 05/31/2022   Procedure: ABDOMINAL AORTOGRAM;  Surgeon: Early Osmond, MD;  Location: [place] [facility];  Service: Cardiovascular;  Laterality: N/A;   ANKLE ARTHROSCOPY Left 01/2011   CATARACT EXTRACTION, BILATERAL     COLONOSCOPY     INTRAOPERATIVE TRANSTHORACIC ECHOCARDIOGRAM N/A 08/01/2022   Procedure: INTRAOPERATIVE TRANSTHORACIC ECHOCARDIOGRAM;  Surgeon: Early Osmond, MD;  Location: [place];  Service: Open Heart Surgery;  Laterality: N/A;   KNEE ARTHROSCOPY Left 04/19/2011   KNEE ARTHROSCOPY WITH MEDIAL MENISECTOMY Right 05/18/2015   Procedure: RIGHT KNEE ARTHROSCOPY WITH  MEDIAL MENISCAL DEBRIDEMENT;  Surgeon: Gaynelle Arabian, MD;  Location: [place];  Service: Orthopedics;  Laterality: Right;   pilonidal fistulectomy     RIGHT HEART CATH AND CORONARY ANGIOGRAPHY N/A 05/31/2022   Procedure: RIGHT HEART CATH AND CORONARY ANGIOGRAPHY;  Surgeon: Early Osmond, MD;  Location: [place] [facility];  Service: Cardiovascular;  Laterality: N/A;   TONSILLECTOMY     pt reports 1/2 still present   TOTAL HIP ARTHROPLASTY Left 12/12/2017   Procedure: LEFT TOTAL HIP ARTHROPLASTY ANTERIOR APPROACH;  Surgeon: Gaynelle Arabian, MD;  Location: [facility];  Service: Orthopedics;  Laterality: Left;   TRANSCATHETER AORTIC VALVE REPLACEMENT, TRANSFEMORAL Left 08/01/2022   Procedure: Transcatheter  Aortic Valve Replacement, Transfemoral using Edwards 29 MM SAPIEN 3 Ultra;  Surgeon: Early Osmond, MD;  Location: [place];  Service: Open Heart Surgery;  Laterality: Left;  Transfemoral valve insertion.   TRANSURETHRAL RESECTION OF PROSTATE     TRANSURETHRAL  RESECTION OF PROSTATE N/A 06/25/2015   Procedure: TRANSURETHRAL RESECTION OF THE PROSTATE WITH GYRUS INSTRUMENTS BLADDER BIOPSY AND FULGURATION;  Surgeon: Cleon Gustin, MD;  Location: [facility];  Service: Urology;  Laterality: N/A;    FAMILY HISTORY: Family History  Problem Relation Age of Onset   Hypercholesterolemia Mother    Stroke Mother        diagnosed w/DM,CVD   CVA Father    Seizures Father     SOCIAL HISTORY: Social History   Socioeconomic History   Marital status: Married    Spouse name: Butch Penny   Number of children: 2   Years of education: Not on file   Highest education level: Not on file  Occupational History   Not on file  Tobacco Use   Smoking status: Former    Types: Cigarettes    Quit date: 05/10/1994    Years since quitting: 28.3   Smokeless tobacco: Never  Vaping Use   Vaping Use: Never used  Substance and Sexual Activity   Alcohol use: Yes    Alcohol/week: 3.0 standard drinks of alcohol    Types: 3 Shots of liquor per week    Comment: 7 DRINKS PER WEEK   Drug use: No   Sexual activity: Not on file  Other Topics Concern   Not on file  Social History Narrative   Not on file   Social Determinants of Health   Financial Resource Strain: Not on file  Food Insecurity: Not on file  Transportation Needs: Not on file  Physical Activity: Not on file  Stress: Not on file  Social Connections: Not on file  Intimate Partner Violence: Not on file    Total time spent reviewing the chart, obtaining history, examined patient, ordering tests, documentation, consultations and family, care coordination was 57 mintues       Marcial Pacas, M.D. Ph.D.  [facility] [address] [place], [place] [address] Ph: [phone] Fax: [phone]  CC:  Eugenie Filler, MD [address] [place],  [place] [address]  Maury Dus, MD

## 2022-09-12 DIAGNOSIS — E876 Hypokalemia: Secondary | ICD-10-CM | POA: Diagnosis not present

## 2022-09-12 DIAGNOSIS — H538 Other visual disturbances: Secondary | ICD-10-CM | POA: Diagnosis not present

## 2022-09-18 DIAGNOSIS — H26491 Other secondary cataract, right eye: Secondary | ICD-10-CM | POA: Diagnosis not present

## 2022-09-18 DIAGNOSIS — Z961 Presence of intraocular lens: Secondary | ICD-10-CM | POA: Diagnosis not present

## 2022-09-28 DIAGNOSIS — M5416 Radiculopathy, lumbar region: Secondary | ICD-10-CM | POA: Diagnosis not present

## 2022-10-13 DIAGNOSIS — I10 Essential (primary) hypertension: Secondary | ICD-10-CM | POA: Diagnosis not present

## 2022-10-13 DIAGNOSIS — E782 Mixed hyperlipidemia: Secondary | ICD-10-CM | POA: Diagnosis not present

## 2022-10-17 DIAGNOSIS — J449 Chronic obstructive pulmonary disease, unspecified: Secondary | ICD-10-CM | POA: Diagnosis not present

## 2022-10-17 DIAGNOSIS — I10 Essential (primary) hypertension: Secondary | ICD-10-CM | POA: Diagnosis not present

## 2022-10-17 DIAGNOSIS — E782 Mixed hyperlipidemia: Secondary | ICD-10-CM | POA: Diagnosis not present

## 2022-10-21 ENCOUNTER — Other Ambulatory Visit: Payer: Self-pay | Admitting: Cardiology

## 2022-11-07 DIAGNOSIS — M5416 Radiculopathy, lumbar region: Secondary | ICD-10-CM | POA: Diagnosis not present

## 2022-11-13 DIAGNOSIS — H3401 Transient retinal artery occlusion, right eye: Secondary | ICD-10-CM | POA: Diagnosis not present

## 2022-11-13 DIAGNOSIS — H35363 Drusen (degenerative) of macula, bilateral: Secondary | ICD-10-CM | POA: Diagnosis not present

## 2022-11-13 DIAGNOSIS — H26491 Other secondary cataract, right eye: Secondary | ICD-10-CM | POA: Diagnosis not present

## 2022-11-13 DIAGNOSIS — H35372 Puckering of macula, left eye: Secondary | ICD-10-CM | POA: Diagnosis not present

## 2022-11-13 DIAGNOSIS — Z961 Presence of intraocular lens: Secondary | ICD-10-CM | POA: Diagnosis not present

## 2022-11-14 DIAGNOSIS — Z23 Encounter for immunization: Secondary | ICD-10-CM | POA: Diagnosis not present

## 2022-11-14 DIAGNOSIS — E782 Mixed hyperlipidemia: Secondary | ICD-10-CM | POA: Diagnosis not present

## 2022-11-14 DIAGNOSIS — J449 Chronic obstructive pulmonary disease, unspecified: Secondary | ICD-10-CM | POA: Diagnosis not present

## 2022-11-14 DIAGNOSIS — M4800 Spinal stenosis, site unspecified: Secondary | ICD-10-CM | POA: Diagnosis not present

## 2022-11-14 DIAGNOSIS — I10 Essential (primary) hypertension: Secondary | ICD-10-CM | POA: Diagnosis not present

## 2022-12-06 DIAGNOSIS — I10 Essential (primary) hypertension: Secondary | ICD-10-CM | POA: Diagnosis not present

## 2022-12-06 DIAGNOSIS — J449 Chronic obstructive pulmonary disease, unspecified: Secondary | ICD-10-CM | POA: Diagnosis not present

## 2022-12-06 DIAGNOSIS — E782 Mixed hyperlipidemia: Secondary | ICD-10-CM | POA: Diagnosis not present

## 2023-01-13 DIAGNOSIS — M5416 Radiculopathy, lumbar region: Secondary | ICD-10-CM | POA: Diagnosis not present

## 2023-01-30 DIAGNOSIS — R3915 Urgency of urination: Secondary | ICD-10-CM | POA: Diagnosis not present

## 2023-02-08 DIAGNOSIS — M5416 Radiculopathy, lumbar region: Secondary | ICD-10-CM | POA: Diagnosis not present

## 2023-02-09 NOTE — Progress Notes (Unsigned)
Patient ID: Mark Zamora MRN: [id number] DOB/AGE: 05-15-1946 77 y.o.  Primary Care Physician:Reade, Herbie [place], MD (Inactive) Primary Cardiologist: None   FOCUSED CARDIOVASCULAR PROBLEM LIST:   1.  Severe aortic stenosis s/p 29 mm Edwards Sapien 3 Ultra Resilia THV via the TF approach on [id number] complicated by TIA 2.  COPD 3.  Spinal stenosis 4.  DNR followed by palliative care (this was a hold over from the pandemic and he is willing to revert to full code in the future) 5.  Osteoarthritis 6.  Spinal stenosis 7.  Frailty and ambulates with a cane 8.  Aortic atherosclerosis on CT scan 9.  Hyperlipidemia 10.  Hypertension  HISTORY OF PRESENT ILLNESS:  June 2023 consultation:  The patient is a 77 y.o. male with the indicated medical history here for recommendations regarding his severe aortic stenosis.  The patient is a very pleasant elderly male here with his daughter.  He is the primary caregiver for his wife who is suffering from dementia.  He does all of his activities of daily living but has noticed over the last 6 months he has had increasing shortness of breath.  On occasion he will get lightheaded when going from sitting to standing.  He additionally occasionally develops chest discomfort.  He has required no emergency room visits or hospitalizations.  Over the last 6 months he is noticing increasing shortness of breath with walking around his house and walking his dog.  He has had to stop and rest on occasion.  He is never really had shortness of breath at rest.  All of this has interrupted his activities of daily living especially given the fact he has to do a lot of things to take care of his wife.  He wishes he were not as short as breath and could eat more easily handled these tasks.  He quit smoking many years ago.  He sees a Pharmacist, community on a regular basis and in fact saw one not too long ago.  He reports very good dental health.  Plan: Refer for aortic valve intervention  evaluation.  Today: The patient returns for routine follow-up.  He underwent transcatheter valve replacement in September.  He unfortunately presented with a TIA a few days after discharge.  He was placed on dual antiplatelet therapy.  He was seen by neurology as an outpatient and he was continued on high-dose aspirin and Plavix was discontinued.  Past Medical History:  Diagnosis Date   AAA (abdominal aortic aneurysm) ([place]) 01/29/2017   2.9cm   Allergic rhinitis    Anxiety    Arthritis    l ankle   BPH (benign prostatic hypertrophy)    Cancer (HCC)    HX SKIN CANCER   Cataract, bilateral    Chronic back pain    Chronic left hip pain    Constipation    COPD (chronic obstructive pulmonary disease) ([place]) 2020   Depression    Essential hypertension    GERD (gastroesophageal reflux disease)    History of chronic sinusitis    History of skin cancer    Hyperlipidemia    Insomnia    Neck pain, chronic    Nocturia    OSA on CPAP    uses cpap   Pedal edema    Chronic   Postherpetic neuralgia    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 Ali Lowe & Dr. Lavonna Monarch  Severe aortic stenosis    Shingles    Spinal stenosis     Past Surgical History:  Procedure Laterality Date   ABDOMINAL AORTOGRAM N/A 05/31/2022   Procedure: ABDOMINAL AORTOGRAM;  Surgeon: Early Osmond, MD;  Location: [place] [facility];  Service: Cardiovascular;  Laterality: N/A;   ANKLE ARTHROSCOPY Left 01/2011   CATARACT EXTRACTION, BILATERAL     COLONOSCOPY     INTRAOPERATIVE TRANSTHORACIC ECHOCARDIOGRAM N/A 08/01/2022   Procedure: INTRAOPERATIVE TRANSTHORACIC ECHOCARDIOGRAM;  Surgeon: Early Osmond, MD;  Location: [place];  Service: Open Heart Surgery;  Laterality: N/A;   KNEE ARTHROSCOPY Left 04/19/2011   KNEE ARTHROSCOPY WITH MEDIAL MENISECTOMY Right 05/18/2015   Procedure: RIGHT KNEE ARTHROSCOPY WITH  MEDIAL MENISCAL  DEBRIDEMENT;  Surgeon: Gaynelle Arabian, MD;  Location: [place];  Service: Orthopedics;  Laterality: Right;   pilonidal fistulectomy     RIGHT HEART CATH AND CORONARY ANGIOGRAPHY N/A 05/31/2022   Procedure: RIGHT HEART CATH AND CORONARY ANGIOGRAPHY;  Surgeon: Early Osmond, MD;  Location: [place] [facility];  Service: Cardiovascular;  Laterality: N/A;   TONSILLECTOMY     pt reports 1/2 still present   TOTAL HIP ARTHROPLASTY Left 12/12/2017   Procedure: LEFT TOTAL HIP ARTHROPLASTY ANTERIOR APPROACH;  Surgeon: Gaynelle Arabian, MD;  [facility];  Service: Orthopedics;  Laterality: Left;   TRANSCATHETER AORTIC VALVE REPLACEMENT, TRANSFEMORAL Left 08/01/2022   Procedure: Transcatheter Aortic Valve Replacement, Transfemoral using Edwards 29 MM SAPIEN 3 Ultra;  Surgeon: Early Osmond, MD;  Location: [facility];  Service: Open Heart Surgery;  Laterality: Left;  Transfemoral valve insertion.   TRANSURETHRAL RESECTION OF PROSTATE     TRANSURETHRAL RESECTION OF PROSTATE N/A 06/25/2015   Procedure: TRANSURETHRAL RESECTION OF THE PROSTATE WITH GYRUS INSTRUMENTS BLADDER BIOPSY AND FULGURATION;  Surgeon: Cleon Gustin, MD;  [facility];  Service: Urology;  Laterality: N/A;    Family History  Problem Relation Age of Onset   Hypercholesterolemia Mother    Stroke Mother        diagnosed w/DM,CVD   CVA Father    Seizures Father     Social History   Socioeconomic History   Marital status: Married    Spouse name: Butch Penny   Number of children: 2   Years of education: Not on file   Highest education level: Not on file  Occupational History   Not on file  Tobacco Use   Smoking status: Former    Types: Cigarettes    Quit date: 05/10/1994    Years since quitting: 28.7   Smokeless tobacco: Never  Vaping Use   Vaping Use: Never used  Substance and Sexual Activity   Alcohol use: Yes    Alcohol/week: 3.0 standard drinks of alcohol    Types: 3 Shots of liquor per week     Comment: 7 DRINKS PER WEEK   Drug use: No   Sexual activity: Not on file  Other Topics Concern   Not on file  Social History Narrative   Not on file   Social Determinants of Health   Financial Resource Strain: Not on file  Food Insecurity: Not on file  Transportation Needs: Not on file  Physical Activity: Not on file  Stress: Not on file  Social Connections: Not on file  Intimate Partner Violence: Not on file     Prior to Admission medications   Medication Sig Start Date End Date Taking? Authorizing Provider  acetaminophen (TYLENOL) 650 MG CR tablet Take 1,300 mg  by mouth every 8 (eight) hours as needed for pain.    [provider]  amLODipine (NORVASC) 10 MG tablet Take 10 mg by mouth every morning.     [provider]  doxazosin (CARDURA) 8 MG tablet Take 8 mg by mouth at bedtime.    [provider]  fluticasone (FLONASE) 50 MCG/ACT nasal spray Place 2 sprays into both nostrils at bedtime.     [provider]  lisinopril (PRINIVIL,ZESTRIL) 40 MG tablet Take 40 mg by mouth daily.    [provider]  meclizine (ANTIVERT) 12.5 MG tablet Take 1 tablet (12.5 mg total) by mouth 3 (three) times daily as needed for dizziness. Patient not taking: Reported on 11/23/2017 04/01/16   Lajean Saver, MD  omeprazole (PRILOSEC) 20 MG capsule Take 20 mg by mouth every morning.     [provider]  oxyCODONE (OXY IR/ROXICODONE) 5 MG immediate release tablet Take 1-2 tablets (5-10 mg total) by mouth every 4 (four) hours as needed for moderate pain or severe pain. 12/13/17   Perkins, Alexzandrew L, PA-C  potassium chloride SA (K-DUR,KLOR-CON) 20 MEQ tablet Take 20 mEq by mouth daily.     [provider]  pravastatin (PRAVACHOL) 80 MG tablet Take 80 mg by mouth every evening.     [provider]  rivaroxaban (XARELTO) 10 MG TABS tablet Take 1 tablet (10 mg total) by mouth daily with breakfast. Take Xarelto for two and a half more weeks  following discharge from the hospital, then discontinue Xarelto. Once the patient has completed the Xarelto, they may resume the 325 mg Aspirin. 12/13/17   Perkins, Alexzandrew L, PA-C  spironolactone (ALDACTONE) 25 MG tablet Take 25 mg by mouth daily.    [provider]  tiZANidine (ZANAFLEX) 4 MG capsule Take 1 capsule (4 mg total) by mouth 3 (three) times daily. 12/13/17   Perkins, Alexzandrew L, PA-C  traMADol (ULTRAM) 50 MG tablet Take 1-2 tablets (50-100 mg total) by mouth every 6 (six) hours as needed (mild pain). 12/13/17   Perkins, Alexzandrew L, PA-C  zolpidem (AMBIEN CR) 12.5 MG CR tablet Take 12.5 mg by mouth at bedtime.    [provider]    Allergies  Allergen Reactions   Compazine [Prochlorperazine Edisylate] Anaphylaxis and Other (See Comments)    Pt states "any nausea med ending in "zine"  Seizures   Cymbalta [Duloxetine Hcl] Other (See Comments)    Urinary retention    Promethazine Anaphylaxis and Other (See Comments)    seizures   Codeine Other (See Comments)    constipation   Other Other (See Comments)    All antiemetic meds ending -zine  Prefers to not take any narcotic pain meds    REVIEW OF SYSTEMS:  General: no fevers/chills/night sweats Eyes: no blurry vision, diplopia, or amaurosis ENT: no sore throat or hearing loss Resp: no cough, wheezing, or hemoptysis CV: no edema or palpitations GI: no abdominal pain, nausea, vomiting, diarrhea, or constipation GU: no dysuria, frequency, or hematuria Skin: no rash Neuro: no headache, numbness, tingling, or weakness of extremities Musculoskeletal: no joint pain or swelling Heme: no bleeding, DVT, or easy bruising Endo: no polydipsia or polyuria  There were no vitals taken for this visit.  PHYSICAL EXAM: GEN:  AO x 3 in no acute distress HEENT: normal Dentition: Normal Neck: JVP normal. +1 carotid upstrokes without bruits but with radiation of aortic stenosis murmur. No thyromegaly. Lungs:  equal expansion, clear bilaterally CV: Apex is discrete and nondisplaced,  RRR with 3 out of 6 systolic ejection murmur  Abd: soft, non-tender, non-distended; no bruit; positive bowel sounds Ext: no edema, ecchymoses, or cyanosis Vascular: 2+ femoral pulses, 2+ radial pulses       Skin: warm and dry without rash Neuro: CN II-XII grossly intact; motor and sensory grossly intact    DATA AND STUDIES:  October 2023 TTE: 1. Left ventricular ejection fraction, by estimation, is 55 to 60%. The  left ventricle has normal function. The left ventricle has no regional  wall motion abnormalities. Left ventricular diastolic parameters are  indeterminate.   2. Right ventricular systolic function is normal. The right ventricular  size is normal. Tricuspid regurgitation signal is inadequate for assessing  PA pressure.   3. Left atrial size was mild to moderately dilated.   4. The mitral valve is normal in structure. No evidence of mitral valve  regurgitation. No evidence of mitral stenosis.   5. The aortic valve has been repaired/replaced. Aortic valve  regurgitation is not visualized. There is a 29 mm Edwards Sapien  prosthetic (TAVR) valve present in the aortic position. Procedure Date:  08/01/2022. Echo findings are consistent with normal  structure and function of the aortic valve prosthesis. Aortic valve area,  by VTI measures 2.83 cm. Aortic valve mean gradient measures 8.5 mmHg.  Aortic valve Vmax measures 2.19 m/s.   6. Aortic dilatation noted. There is borderline dilatation of the  ascending aorta, measuring 38 mm.   7. The inferior vena cava is normal in size with greater than 50%  respiratory variability, suggesting right atrial pressure of 3 mmHg.     ASSESSMENT AND PLAN:   S/P TAVR (transcatheter aortic valve replacement)  Cardioembolic stroke [facility])  Essential hypertension  Aortic atherosclerosis (HCC)  Hyperlipidemia, unspecified hyperlipidemia type  1.  Status post  TAVR: 2.  Cardioembolic stroke: Continue aspirin 325 mg indefinitely.  Could consider changing to Plavix monotherapy indefinitely as well. 3.  Hypertension: 4.  Aortic atherosclerosis: Continue aspirin and statin. 5.  Hyperlipidemia: Given history of stroke goal LDL is less than 55.  Will check lipid panel, LFTs, and LP(a) today.  Early Osmond, MD  02/09/2023 2:21 PM    [facility] [place], [place], [place]  [address] Phone: [phone]; Fax: [phone]

## 2023-02-13 ENCOUNTER — Encounter: Payer: Self-pay | Admitting: Internal Medicine

## 2023-02-13 ENCOUNTER — Ambulatory Visit: Payer: Medicare Other | Attending: Internal Medicine | Admitting: Internal Medicine

## 2023-02-13 VITALS — BP 120/78 | HR 58 | Ht 64.5 in | Wt 178.2 lb

## 2023-02-13 DIAGNOSIS — I700 Atherosclerosis of aorta: Secondary | ICD-10-CM

## 2023-02-13 DIAGNOSIS — I639 Cerebral infarction, unspecified: Secondary | ICD-10-CM

## 2023-02-13 DIAGNOSIS — E785 Hyperlipidemia, unspecified: Secondary | ICD-10-CM

## 2023-02-13 DIAGNOSIS — Z952 Presence of prosthetic heart valve: Secondary | ICD-10-CM

## 2023-02-13 DIAGNOSIS — I10 Essential (primary) hypertension: Secondary | ICD-10-CM

## 2023-02-13 MED ORDER — ASPIRIN 81 MG PO TBEC: 81.0000 mg | DELAYED_RELEASE_TABLET | Freq: Every day | ORAL | 3 refills | Status: AC

## 2023-02-13 NOTE — Patient Instructions (Signed)
Medication Instructions:  1.) decrease aspirin to 81 mg daily *If you need a refill on your cardiac medications before your next appointment, please call your pharmacy*   Lab Work: none If you have labs (blood work) drawn today and your tests are completely normal, you will receive your results only by: [place] (if you have MyChart) OR A paper copy in the mail If you have any lab test that is abnormal or we need to change your treatment, we will call you to review the results.   Testing/Procedures: none   Follow-Up: At [facility], you and your health needs are our priority.  As part of our continuing mission to provide you with exceptional heart care, we have created designated Provider Care Teams.  These Care Teams include your primary Cardiologist (physician) and Advanced Practice Providers (APPs -  Physician Assistants and Nurse Practitioners) who all work together to provide you with the care you need, when you need it.   Your next appointment:   12 month(s)  Provider:   Richardson Dopp, PA-C,  Melina Copa, PA-C, Christen Bame, NP, Ermalinda Barrios, PA-C  Other Instructions Keep already scheduled follow up in September with Structural Heart Team

## 2023-02-14 DIAGNOSIS — E782 Mixed hyperlipidemia: Secondary | ICD-10-CM | POA: Diagnosis not present

## 2023-02-14 DIAGNOSIS — I10 Essential (primary) hypertension: Secondary | ICD-10-CM | POA: Diagnosis not present

## 2023-02-14 DIAGNOSIS — G4700 Insomnia, unspecified: Secondary | ICD-10-CM | POA: Diagnosis not present

## 2023-02-14 DIAGNOSIS — J449 Chronic obstructive pulmonary disease, unspecified: Secondary | ICD-10-CM | POA: Diagnosis not present

## 2023-03-21 DIAGNOSIS — J449 Chronic obstructive pulmonary disease, unspecified: Secondary | ICD-10-CM | POA: Diagnosis not present

## 2023-03-21 DIAGNOSIS — I10 Essential (primary) hypertension: Secondary | ICD-10-CM | POA: Diagnosis not present

## 2023-03-21 DIAGNOSIS — E782 Mixed hyperlipidemia: Secondary | ICD-10-CM | POA: Diagnosis not present

## 2023-03-27 DIAGNOSIS — R3915 Urgency of urination: Secondary | ICD-10-CM | POA: Diagnosis not present

## 2023-04-07 DIAGNOSIS — M5416 Radiculopathy, lumbar region: Secondary | ICD-10-CM | POA: Diagnosis not present

## 2023-04-20 ENCOUNTER — Other Ambulatory Visit (HOSPITAL_COMMUNITY): Payer: Self-pay

## 2023-04-20 MED ORDER — ZOLPIDEM TARTRATE ER 12.5 MG PO TBCR
EXTENDED_RELEASE_TABLET | ORAL | 0 refills | Status: AC
  Filled 2023-04-20: qty 30, 30d supply, fill #0

## 2023-04-24 ENCOUNTER — Other Ambulatory Visit (HOSPITAL_COMMUNITY): Payer: Self-pay

## 2023-05-07 DIAGNOSIS — M5416 Radiculopathy, lumbar region: Secondary | ICD-10-CM | POA: Diagnosis not present

## 2023-05-07 DIAGNOSIS — M1612 Unilateral primary osteoarthritis, left hip: Secondary | ICD-10-CM | POA: Diagnosis not present

## 2023-05-07 DIAGNOSIS — M48062 Spinal stenosis, lumbar region with neurogenic claudication: Secondary | ICD-10-CM | POA: Diagnosis not present

## 2023-05-10 DIAGNOSIS — H35372 Puckering of macula, left eye: Secondary | ICD-10-CM | POA: Diagnosis not present

## 2023-05-10 DIAGNOSIS — H35363 Drusen (degenerative) of macula, bilateral: Secondary | ICD-10-CM | POA: Diagnosis not present

## 2023-05-10 DIAGNOSIS — Z961 Presence of intraocular lens: Secondary | ICD-10-CM | POA: Diagnosis not present

## 2023-05-10 DIAGNOSIS — H348112 Central retinal vein occlusion, right eye, stable: Secondary | ICD-10-CM | POA: Diagnosis not present

## 2023-05-11 DIAGNOSIS — M5451 Vertebrogenic low back pain: Secondary | ICD-10-CM | POA: Diagnosis not present

## 2023-05-11 DIAGNOSIS — M5459 Other low back pain: Secondary | ICD-10-CM | POA: Diagnosis not present

## 2023-05-26 DIAGNOSIS — M5459 Other low back pain: Secondary | ICD-10-CM | POA: Diagnosis not present

## 2023-05-30 ENCOUNTER — Telehealth: Payer: Self-pay

## 2023-05-30 ENCOUNTER — Telehealth: Payer: Self-pay | Admitting: *Deleted

## 2023-05-30 DIAGNOSIS — M48062 Spinal stenosis, lumbar region with neurogenic claudication: Secondary | ICD-10-CM | POA: Diagnosis not present

## 2023-05-30 DIAGNOSIS — M5451 Vertebrogenic low back pain: Secondary | ICD-10-CM | POA: Diagnosis not present

## 2023-05-30 DIAGNOSIS — M4310 Spondylolisthesis, site unspecified: Secondary | ICD-10-CM | POA: Diagnosis not present

## 2023-05-30 NOTE — Telephone Encounter (Signed)
Pt has been scheduled for tele pre op appt 06/07/23 @ 1:40. Med rec and consent are done.     Patient Consent for Virtual Visit        Mark Zamora has provided verbal consent on 05/30/2023 for a virtual visit (video or telephone).   CONSENT FOR VIRTUAL VISIT FOR:  Mark Zamora  By participating in this virtual visit I agree to the following:  I hereby voluntarily request, consent and authorize [facility] and its employed or contracted physicians, physician assistants, nurse practitioners or other licensed health care professionals (the Practitioner), to provide me with telemedicine health care services (the "Services") as deemed necessary by the treating Practitioner. I acknowledge and consent to receive the Services by the Practitioner via telemedicine. I understand that the telemedicine visit will involve communicating with the Practitioner through live audiovisual communication technology and the disclosure of certain medical information by electronic transmission. I acknowledge that I have been given the opportunity to request an in-person assessment or other available alternative prior to the telemedicine visit and am voluntarily participating in the telemedicine visit.  I understand that I have the right to withhold or withdraw my consent to the use of telemedicine in the course of my care at any time, without affecting my right to future care or treatment, and that the Practitioner or I may terminate the telemedicine visit at any time. I understand that I have the right to inspect all information obtained and/or recorded in the course of the telemedicine visit and may receive copies of available information for a reasonable fee.  I understand that some of the potential risks of receiving the Services via telemedicine include:  Delay or interruption in medical evaluation due to technological equipment failure or disruption; Information transmitted may not be sufficient  (e.g. poor resolution of images) to allow for appropriate medical decision making by the Practitioner; and/or  In rare instances, security protocols could fail, causing a breach of personal health information.  Furthermore, I acknowledge that it is my responsibility to provide information about my medical history, conditions and care that is complete and accurate to the best of my ability. I acknowledge that Practitioner's advice, recommendations, and/or decision may be based on factors not within their control, such as incomplete or inaccurate data provided by me or distortions of diagnostic images or specimens that may result from electronic transmissions. I understand that the practice of medicine is not an exact science and that Practitioner makes no warranties or guarantees regarding treatment outcomes. I acknowledge that a copy of this consent can be made available to me via my patient portal [facility] MyChart), or I can request a printed copy by calling the office of [facility].    I understand that my insurance will be billed for this visit.   I have read or had this consent read to me. I understand the contents of this consent, which adequately explains the benefits and risks of the Services being provided via telemedicine.  I have been provided ample opportunity to ask questions regarding this consent and the Services and have had my questions answered to my satisfaction. I give my informed consent for the services to be provided through the use of telemedicine in my medical care

## 2023-05-30 NOTE — Telephone Encounter (Signed)
   Pre-operative Risk Assessment    Patient Name: Mark Zamora  DOB: 10-16-1946 MRN: [id number]      Request for Surgical Clearance    Procedure:   lumbar decompression and fusion  Date of Surgery:  Clearance TBD                                 Surgeon:  Raechel Chute Surgeon's Group or Practice Name:  Dr. Venita Lick Phone number:  [phone] Fax number:  [phone] Attn Rosalva Ferron   Type of Clearance Requested:   - Medical  - Pharmacy:  Hold Aspirin will need instructions on when/if to hold   Type of Anesthesia:  Not Indicated   Additional requests/questions:    Wynetta Fines   05/30/2023, 2:32 PM

## 2023-05-30 NOTE — Telephone Encounter (Signed)
Pt has been scheduled for tele pre op appt 06/07/23 @ 1:40. Med rec and consent are done.

## 2023-05-30 NOTE — Telephone Encounter (Signed)
   Name: Mark Zamora  DOB: 1946-05-25  MRN: [id number]  Primary Cardiologist: Orbie Pyo, MD   Preoperative team, please contact this patient and set up a phone call appointment for further preoperative risk assessment. Please obtain consent and complete medication review. Thank you for your help.  I confirm that guidance regarding antiplatelet and oral anticoagulation therapy has been completed and, if necessary, noted below.   Carlos Levering, NP 05/30/2023, 5:21 PM [facility]

## 2023-06-04 DIAGNOSIS — M5451 Vertebrogenic low back pain: Secondary | ICD-10-CM | POA: Diagnosis not present

## 2023-06-04 DIAGNOSIS — M4800 Spinal stenosis, site unspecified: Secondary | ICD-10-CM | POA: Diagnosis not present

## 2023-06-04 DIAGNOSIS — Z01818 Encounter for other preprocedural examination: Secondary | ICD-10-CM | POA: Diagnosis not present

## 2023-06-05 ENCOUNTER — Ambulatory Visit (HOSPITAL_COMMUNITY): Payer: Self-pay | Admitting: Orthopedic Surgery

## 2023-06-05 NOTE — Telephone Encounter (Signed)
Pt has tele pre op appt 06/07/23. Our office received a 2nd request today for same procedure. I will update the requesting office of the tele pre op appt.

## 2023-06-07 ENCOUNTER — Ambulatory Visit: Payer: Medicare Other | Attending: Cardiovascular Disease

## 2023-06-07 DIAGNOSIS — Z01810 Encounter for preprocedural cardiovascular examination: Secondary | ICD-10-CM | POA: Diagnosis not present

## 2023-06-07 NOTE — Progress Notes (Signed)
Virtual Visit via Telephone Note   Because of Mark Zamora's co-morbid illnesses, he is at least at moderate risk for complications without adequate follow up.  This format is felt to be most appropriate for this patient at this time.  The patient did not have access to video technology/had technical difficulties with video requiring transitioning to audio format only (telephone).  All issues noted in this document were discussed and addressed.  No physical exam could be performed with this format.  Please refer to the patient's chart for his consent to telehealth for [facility].  Evaluation Performed:  Preoperative cardiovascular risk assessment _____________   Date:  06/07/2023   Patient ID:  Mark Zamora, DOB 1946-07-22, MRN [id number] Patient Location:  Home Provider location:   Office  Primary Care Provider:  Aliene Beams, MD Primary Cardiologist:  Orbie Pyo, MD  Chief Complaint / Patient Profile   77 y.o. y/o male with a h/o AS s/p TAVR complicated by TIA, COPD, aortic atherosclerosis on CT, HLD, HTN, OSA on CPAP  who is pending POSTERIOR LUMBAR FUSION INTERBODY WITH DECOMPRESSION L3-4 and presents today for telephonic preoperative cardiovascular risk assessment.  History of Present Illness    Mark Zamora is a 77 y.o. male who presents via audio/video conferencing for a telehealth visit today.  Pt was last seen in cardiology clinic on 02/13/23 by Dr. Lynnette Caffey.  At that time Mark Zamora was doing well.  The patient is now pending procedure as outlined above. Since his last visit, he remains as active as possible given back and leg pain. He can still walk his dogs 1-2 miles without angina. He an no longer able to go up hills due to back pain. His activity is limited by back pain.   Past Medical History    Past Medical History:  Diagnosis Date   AAA (abdominal aortic aneurysm) (HCC) 01/29/2017   2.9cm   Allergic rhinitis    Anxiety     Arthritis    l ankle   BPH (benign prostatic hypertrophy)    Cancer (HCC)    HX SKIN CANCER   Cataract, bilateral    Chronic back pain    Chronic left hip pain    Constipation    COPD (chronic obstructive pulmonary disease) (HCC) 2020   Depression    Essential hypertension    GERD (gastroesophageal reflux disease)    History of chronic sinusitis    History of skin cancer    Hyperlipidemia    Insomnia    Neck pain, chronic    Nocturia    OSA on CPAP    uses cpap   Pedal edema    Chronic   Postherpetic neuralgia    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 Lynnette Caffey & Dr. Leafy Ro   Severe aortic stenosis    Shingles    Spinal stenosis    Past Surgical History:  Procedure Laterality Date   ABDOMINAL AORTOGRAM N/A 05/31/2022   Procedure: ABDOMINAL AORTOGRAM;  Surgeon: Orbie Pyo, MD;  Location: [facility];  Service: Cardiovascular;  Laterality: N/A;   ANKLE ARTHROSCOPY Left 01/2011   CATARACT EXTRACTION, BILATERAL     COLONOSCOPY     INTRAOPERATIVE TRANSTHORACIC ECHOCARDIOGRAM N/A 08/01/2022   Procedure: INTRAOPERATIVE TRANSTHORACIC ECHOCARDIOGRAM;  Surgeon: Orbie Pyo, MD;  Location: [facility] OR;  Service: Open Heart Surgery;  Laterality: N/A;  KNEE ARTHROSCOPY Left 04/19/2011   KNEE ARTHROSCOPY WITH MEDIAL MENISECTOMY Right 05/18/2015   Procedure: RIGHT KNEE ARTHROSCOPY WITH  MEDIAL MENISCAL DEBRIDEMENT;  Surgeon: Ollen Gross, MD;  Location: [facility] [place];  Service: Orthopedics;  Laterality: Right;   pilonidal fistulectomy     RIGHT HEART CATH AND CORONARY ANGIOGRAPHY N/A 05/31/2022   Procedure: RIGHT HEART CATH AND CORONARY ANGIOGRAPHY;  Surgeon: Orbie Pyo, MD;  Location: [facility];  Service: Cardiovascular;  Laterality: N/A;   TONSILLECTOMY     pt reports 1/2 still present   TOTAL HIP ARTHROPLASTY Left 12/12/2017   Procedure: LEFT TOTAL HIP  ARTHROPLASTY ANTERIOR APPROACH;  Surgeon: Ollen Gross, MD;  Location: [facility] OR[facility];  Service: Orthopedics;  Laterality: Left;   TRANSCATHETER AORTIC VALVE REPLACEMENT, TRANSFEMORAL Left 08/01/2022   Procedure: Transcatheter Aortic Valve Replacement, Transfemoral using Edwards 29 MM SAPIEN 3 Ultra;  Surgeon: Orbie Pyo, MD;  Location: [facility] OR;  Service: Open Heart Surgery;  Laterality: Left;  Transfemoral valve insertion.   TRANSURETHRAL RESECTION OF PROSTATE     TRANSURETHRAL RESECTION OF PROSTATE N/A 06/25/2015   Procedure: TRANSURETHRAL RESECTION OF THE PROSTATE WITH GYRUS INSTRUMENTS BLADDER BIOPSY AND FULGURATION;  Surgeon: Malen Gauze, MD;  Location: [facility] OR[facility];  Service: Urology;  Laterality: N/A;    Allergies  Allergies  Allergen Reactions   Compazine [Prochlorperazine Edisylate] Anaphylaxis and Other (See Comments)    Pt states "any nausea med ending in "zine"  Seizures   Cymbalta [Duloxetine Hcl] Other (See Comments)    Urinary retention    Promethazine Anaphylaxis and Other (See Comments)    seizures   Codeine Other (See Comments)    constipation   Other Other (See Comments)    All antiemetic meds ending -zine  Prefers to not take any narcotic pain meds    Home Medications    Prior to Admission medications   Medication Sig Start Date End Date Taking? Authorizing Provider  acetaminophen (TYLENOL) 650 MG CR tablet Take 1,300 mg by mouth every 8 (eight) hours as needed for pain.    [provider]  albuterol (VENTOLIN HFA) 108 (90 Base) MCG/ACT inhaler Inhale 1-2 puffs into the lungs every 6 (six) hours as needed for shortness of breath or wheezing. 04/10/22   [provider]  amLODipine (NORVASC) 10 MG tablet Take 10 mg by mouth daily.    [provider]  amoxicillin (AMOXIL) 500 MG tablet Take 4 tablets (2,000 mg total) by mouth as directed. 1 HOUR PRIOR TO DENTAL APPOINTMENTS 08/30/22   Mark Chard D, NP  aspirin EC 81 MG tablet Take 1 tablet  (81 mg total) by mouth daily. Swallow whole. 02/13/23   Orbie Pyo, MD  budesonide-formoterol (SYMBICORT) 160-4.5 MCG/ACT inhaler Inhale 2 puffs into the lungs 2 (two) times daily.    [provider]  Cholecalciferol (VITAMIN D3) 50 MCG (2000 UT) capsule Take 2,000 Units by mouth daily.    [provider]  diclofenac (VOLTAREN) 75 MG EC tablet Take 75 mg by mouth 2 (two) times daily. 06/13/22   [provider]  doxazosin (CARDURA) 8 MG tablet Take 8 mg by mouth at bedtime.    [provider]  famotidine (PEPCID) 40 MG tablet Take 40 mg by mouth every evening.    [provider]  fluticasone (FLONASE) 50 MCG/ACT nasal spray Place 2 sprays into both nostrils at bedtime.    [provider]  lisinopril (ZESTRIL) 40 MG tablet Take 1 tablet (40  mg total) by mouth daily. 08/14/22   Rodolph Bong, MD  Melatonin 10 MG TABS Take 20 mg by mouth at bedtime.    [provider]  NON FORMULARY Pt uses a cpap nightly    [provider]  pantoprazole (PROTONIX) 40 MG tablet TAKE ONE TABLET BY MOUTH ONE TIME DAILY 10/24/22   Mark Chard D, NP  potassium chloride SA (K-DUR,KLOR-CON) 20 MEQ tablet Take 20 mEq by mouth daily.     [provider]  Psyllium (METAMUCIL) 28.3 % POWD Take 1 Scoop by mouth daily.    [provider]  rosuvastatin (CRESTOR) 10 MG tablet TAKE ONE TABLET BY MOUTH ONE TIME DAILY 10/24/22   Mark Chard D, NP  spironolactone (ALDACTONE) 25 MG tablet Take 0.5 tablets (12.5 mg total) by mouth daily. Take 6.25 mg by mouth daily. 08/18/22   Rodolph Bong, MD  tiZANidine (ZANAFLEX) 4 MG capsule Take 1 capsule (4 mg total) by mouth 3 (three) times daily. 12/13/17   Perkins, Alexzandrew L, PA-C  traMADol (ULTRAM) 50 MG tablet Take 1-2 tablets (50-100 mg total) by mouth every 6 (six) hours as needed (mild pain). Patient taking differently: Take 50 mg by mouth every 6 (six) hours as needed (mild pain).  PT STATES HE TAKES 3 OR 4 TABLETS DAILY 12/13/17   Perkins, Alexzandrew L, PA-C  vitamin E 180 MG (400 UNITS) capsule Take 400 Units by mouth daily.    [provider]  zolpidem (AMBIEN CR) 12.5 MG CR tablet Take 12.5 mg by mouth at bedtime.    [provider]  zolpidem (AMBIEN CR) 12.5 MG CR tablet Take 1 tablet by mouth once daily at bedtime as needed 04/20/23       Physical Exam    Vital Signs:  TOMAZ JANIS does not have vital signs available for review today.  Given telephonic nature of communication, physical exam is limited. AAOx3. NAD. Normal affect.  Speech and respirations are unlabored.  Accessory Clinical Findings    None  Assessment & Plan    1.  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 Hx of TIA following TAVR in 08/01/22. Reassuring echo 08/2022 with good valve function. According to the [facility], he has a 0.9% risk of MACE during the perioperative period. He can complete 4.0 METS without angina. He understands his risk and wishes to proceed.   He takes ASA following TIA and TAVR, but is greater than 6 months from these events.   Given TIA and TAVR, would prefer to continue ASA if possible. If this will increase bleeding, can hold ASA 5-7 days and resume after surgery.    The patient was advised that if he develops new symptoms prior to surgery to contact our office to arrange for a follow-up visit, and he verbalized understanding.   A copy of this note will be routed to requesting surgeon.  Time:   Today, I have spent 10 minutes with the patient with telehealth technology discussing medical history, symptoms, and management plan.     Roe Rutherford Shelia Kingsberry, PA  06/07/2023, 11:21 AM

## 2023-06-13 ENCOUNTER — Encounter (HOSPITAL_COMMUNITY): Payer: Self-pay

## 2023-06-13 NOTE — Progress Notes (Signed)
PCP - Dr Aliene Beams Cardiologist - Dr Alverda Skeans (Clearance on 06/07/23)  Chest x-ray - 07/28/22 (2V) EKG - 02/03/23 Stress Test - n/a ECHO - 08/30/22 Cardiac Cath - 05/31/22  ICD Pacemaker/Loop - n/a  Sleep Study -  Yes CPAP - uses CPAP nightly  Diabetes Type - n/a  Aspirin Instructions: Follow your surgeon's instructions on when to stop aspirin prior to surgery,  If no instructions were given by your surgeon then you will need to call the office for those instructions.  ERAS: Clear liquids til 4:30 AM DOS.  Anesthesia review: Yes  STOP now taking any Aspirin (unless otherwise instructed by your surgeon), Aleve, Naproxen, Ibuprofen, Motrin, Advil, Goody's, BC's, all herbal medications, fish oil, and all vitamins.   Coronavirus Screening Do you have any of the following symptoms:  Cough yes/no: No Fever (>100.29F)  yes/no: No Runny nose yes/no: No Sore throat yes/no: No Difficulty breathing/shortness of breath  Yes  Have you traveled in the last 14 days and where? yes/no: No  Patient verbalized understanding of instructions that were given to them at the PAT appointment. Patient was also instructed that they will need to review over the PAT instructions again at home before surgery.

## 2023-06-13 NOTE — Telephone Encounter (Signed)
Returned call to pt, left a message for pt to call back.  

## 2023-06-13 NOTE — Progress Notes (Signed)
Surgical Instructions    Your procedure is scheduled on Monday June 25, 2023.  Report to [facility] Main Entrance "A" at 5:30 A.M., then check in with the Admitting office.  Call this number if you have problems the morning of surgery:  [phone]   If you have any questions prior to your surgery date call [phone]: Open Monday-Friday 8am-4pm If you experience any cold or flu symptoms such as cough, fever, chills, shortness of breath, etc. between now and your scheduled surgery, please notify us at the above number     Remember:  Do not eat after midnight the night before your surgery.  You may drink clear liquids until 4:30 the morning of your surgery.   Clear liquids allowed are: Water, Non-Citrus Juices (without pulp), Carbonated Beverages, Clear Tea, Black Coffee ONLY (NO MILK, CREAM OR POWDERED CREAMER of any kind), and Gatorade.    Take these medicines the morning of surgery with A SIP OF WATER:  amLODipine (NORVASC)  pantoprazole (PROTONIX)  rosuvastatin (CRESTOR)  tiZANidine (ZANAFLEX   If needed:  acetaminophen (TYLENOL)  albuterol Inhaler: Please bring with you the day of surgery.  antiseptic oral rinse (BIOTENE)  SYMBICORT Inhaler: please bring with you day of surgery.  traMADol Janean Sark)   Follow your surgeon's instructions on when to stop Aspirin.  If no instructions were given by your surgeon then you will need to call the office to get those instructions.    As of today, STOP taking any (unless otherwise instructed by your surgeon) Aleve, Naproxen, Ibuprofen, Motrin, Advil, Goody's, BC's, all herbal medications, fish oil, and all vitamins. This includes diclofenac (VOLTAREN).   Special instructions:    Oral Hygiene is also important to reduce your risk of infection.  Remember - BRUSH YOUR TEETH THE MORNING OF SURGERY WITH YOUR REGULAR TOOTHPASTE   [place]- Preparing For Surgery    Pre-operative 5 CHG Bath Instructions   You can play a key role in  reducing the risk of infection after surgery. Your skin needs to be as free of germs as possible. You can reduce the number of germs on your skin by washing with CHG (chlorhexidine gluconate) soap before surgery. CHG is an antiseptic soap that kills germs and continues to kill germs even after washing.   DO NOT use if you have an allergy to chlorhexidine/CHG or antibacterial soaps. If your skin becomes reddened or irritated, stop using the CHG and notify one of our RNs at [phone].   Please shower with the CHG soap starting 4 days before surgery using the following schedule:     Please keep in mind the following:  DO NOT shave, including legs and underarms, starting the day of your first shower.   You may shave your face at any point before/day of surgery.  Place clean sheets on your bed the day you start using CHG soap. Use a clean washcloth (not used since being washed) for each shower. DO NOT sleep with pets once you start using the CHG.   CHG Shower Instructions:  If you choose to wash your hair and private area, wash first with your normal shampoo/soap.  After you use shampoo/soap, rinse your hair and body thoroughly to remove shampoo/soap residue.  Turn the water OFF and apply about 3 tablespoons (45 ml) of CHG soap to a CLEAN washcloth.  Apply CHG soap ONLY FROM YOUR NECK DOWN TO YOUR TOES (washing for 3-5 minutes)  DO NOT use CHG soap on face, private areas, open wounds,  or sores.  Pay special attention to the area where your surgery is being performed.  If you are having back surgery, having someone wash your back for you may be helpful. Wait 2 minutes after CHG soap is applied, then you may rinse off the CHG soap.  Pat dry with a clean towel  Put on clean clothes/pajamas   If you choose to wear lotion, please use ONLY the CHG-compatible lotions on the back of this paper.     Additional instructions for the day of surgery: DO NOT APPLY any lotions, deodorants, cologne, or  perfumes.   Put on clean/comfortable clothes.  Brush your teeth.  Ask your nurse before applying any prescription medications to the skin.      CHG Compatible Lotions   Aveeno Moisturizing lotion  Cetaphil Moisturizing Cream  Cetaphil Moisturizing Lotion  Clairol Herbal Essence Moisturizing Lotion, Dry Skin  Clairol Herbal Essence Moisturizing Lotion, Extra Dry Skin  Clairol Herbal Essence Moisturizing Lotion, Normal Skin  Curel Age Defying Therapeutic Moisturizing Lotion with Alpha Hydroxy  Curel Extreme Care Body Lotion  Curel Soothing Hands Moisturizing Hand Lotion  Curel Therapeutic Moisturizing Cream, Fragrance-Free  Curel Therapeutic Moisturizing Lotion, Fragrance-Free  Curel Therapeutic Moisturizing Lotion, Original Formula  Eucerin Daily Replenishing Lotion  Eucerin Dry Skin Therapy Plus Alpha Hydroxy Crme  Eucerin Dry Skin Therapy Plus Alpha Hydroxy Lotion  Eucerin Original Crme  Eucerin Original Lotion  Eucerin Plus Crme Eucerin Plus Lotion  Eucerin TriLipid Replenishing Lotion  Keri Anti-Bacterial Hand Lotion  Keri Deep Conditioning Original Lotion Dry Skin Formula Softly Scented  Keri Deep Conditioning Original Lotion, Fragrance Free Sensitive Skin Formula  Keri Lotion Fast Absorbing Fragrance Free Sensitive Skin Formula  Keri Lotion Fast Absorbing Softly Scented Dry Skin Formula  Keri Original Lotion  Keri Skin Renewal Lotion Keri Silky Smooth Lotion  Keri Silky Smooth Sensitive Skin Lotion  Nivea Body Creamy Conditioning Oil  Nivea Body Extra Enriched Lotion  Nivea Body Original Lotion  Nivea Body Sheer Moisturizing Lotion Nivea Crme  Nivea Skin Firming Lotion  NutraDerm 30 Skin Lotion  NutraDerm Skin Lotion  NutraDerm Therapeutic Skin Cream  NutraDerm Therapeutic Skin Lotion  ProShield Protective Hand Cream  Provon moisturizing lotion   [facility] is not responsible for any belongings or valuables.    Do NOT Smoke (Tobacco/Vaping)  24 hours  prior to your procedure  If you use a CPAP at night, you may bring your mask for your overnight stay.   Contacts, glasses, hearing aids, dentures or partials may not be worn into surgery, please bring cases for these belongings   For patients admitted to the hospital, discharge time will be determined by your treatment team.   Patients discharged the day of surgery will not be allowed to drive home, and someone needs to stay with them for 24 hours.   SURGICAL WAITING ROOM VISITATION Patients having surgery or a procedure may have no more than 2 support people in the waiting area - these visitors may rotate.   Children under the age of 29 must have an adult with them who is not the patient. If the patient needs to stay at the hospital during part of their recovery, the visitor guidelines for inpatient rooms apply. Pre-op nurse will coordinate an appropriate time for 1 support person to accompany patient in pre-op.  This support person may not rotate.   Please refer to [email or web address] for the visitor guidelines for Inpatients (after your surgery is over and you are in a  regular room).    If you received a COVID test during your pre-op visit, it is requested that you wear a mask when out in public, stay away from anyone that may not be feeling well, and notify your surgeon if you develop symptoms. If you have been in contact with anyone that has tested positive in the last 10 days, please notify your surgeon.    Please read over the following fact sheets that you were given.

## 2023-06-13 NOTE — Telephone Encounter (Signed)
Patient is requesting call back to discuss pre-admission testing and details.

## 2023-06-14 ENCOUNTER — Encounter (HOSPITAL_COMMUNITY): Payer: Self-pay

## 2023-06-14 ENCOUNTER — Other Ambulatory Visit: Payer: Self-pay

## 2023-06-14 ENCOUNTER — Encounter (HOSPITAL_COMMUNITY)
Admission: RE | Admit: 2023-06-14 | Discharge: 2023-06-14 | Disposition: A | Discharge status: Home / Self Care | Payer: Medicare Other | Source: Ambulatory Visit | Attending: Orthopedic Surgery | Admitting: Orthopedic Surgery

## 2023-06-14 VITALS — BP 148/77 | HR 58 | Temp 98.0°F | Resp 18 | Ht 64.0 in | Wt 175.0 lb

## 2023-06-14 DIAGNOSIS — I2510 Atherosclerotic heart disease of native coronary artery without angina pectoris: Secondary | ICD-10-CM | POA: Diagnosis not present

## 2023-06-14 DIAGNOSIS — I10 Essential (primary) hypertension: Secondary | ICD-10-CM | POA: Insufficient documentation

## 2023-06-14 DIAGNOSIS — G4733 Obstructive sleep apnea (adult) (pediatric): Secondary | ICD-10-CM | POA: Diagnosis not present

## 2023-06-14 DIAGNOSIS — E785 Hyperlipidemia, unspecified: Secondary | ICD-10-CM | POA: Insufficient documentation

## 2023-06-14 DIAGNOSIS — Z952 Presence of prosthetic heart valve: Secondary | ICD-10-CM | POA: Diagnosis not present

## 2023-06-14 DIAGNOSIS — Z01812 Encounter for preprocedural laboratory examination: Secondary | ICD-10-CM | POA: Diagnosis not present

## 2023-06-14 DIAGNOSIS — Z01818 Encounter for other preprocedural examination: Secondary | ICD-10-CM

## 2023-06-14 DIAGNOSIS — Z8673 Personal history of transient ischemic attack (TIA), and cerebral infarction without residual deficits: Secondary | ICD-10-CM | POA: Diagnosis not present

## 2023-06-14 HISTORY — DX: Dependence on other enabling machines and devices: Z99.89

## 2023-06-14 LAB — CBC
HCT: 41.4 % (ref 39.0–52.0)
Hemoglobin: 13.7 g/dL (ref 13.0–17.0)
MCH: 29.8 pg (ref 26.0–34.0)
MCHC: 33.1 g/dL (ref 30.0–36.0)
MCV: 90 fL (ref 80.0–100.0)
Platelets: 178 10*3/uL (ref 150–400)
RBC: 4.6 MIL/uL (ref 4.22–5.81)
RDW: 14.1 % (ref 11.5–15.5)
WBC: 7.9 10*3/uL (ref 4.0–10.5)
nRBC: 0 % (ref 0.0–0.2)

## 2023-06-14 LAB — TYPE AND SCREEN
ABO/RH(D): A NEG
Antibody Screen: NEGATIVE

## 2023-06-14 LAB — BASIC METABOLIC PANEL
Anion gap: 9 (ref 5–15)
BUN: 10 mg/dL (ref 8–23)
CO2: 23 mmol/L (ref 22–32)
Calcium: 9.3 mg/dL (ref 8.9–10.3)
Chloride: 98 mmol/L (ref 98–111)
Creatinine, Ser: 0.71 mg/dL (ref 0.61–1.24)
GFR, Estimated: >60 mL/min (ref >60)
Glucose, Bld: 92 mg/dL (ref 70–99)
Potassium: 3.4 mmol/L — ABNORMAL LOW (ref 3.5–5.1)
Sodium: 130 mmol/L — ABNORMAL LOW (ref 135–145)

## 2023-06-14 LAB — SURGICAL PCR SCREEN
MRSA, PCR: POSITIVE — AB
Staphylococcus aureus: POSITIVE — AB

## 2023-06-14 NOTE — Telephone Encounter (Signed)
Spoke with the patient who states he has already spoke with someone who answered his questions such as do we have valet parking and if someone can push him down the hall in a wheelchair because he has a bad back. He states he appreciates me calling back.

## 2023-06-14 NOTE — Progress Notes (Signed)
Surgical Instructions    Your procedure is scheduled on Monday June 25, 2023.  Report to [facility] Main Entrance "A" at 5:30 A.M., then check in with the Admitting office.  Call this number if you have problems the morning of surgery:  [phone]   If you have any questions prior to your surgery date call [phone]: Open Monday-Friday 8am-4pm If you experience any cold or flu symptoms such as cough, fever, chills, shortness of breath, etc. between now and your scheduled surgery, please notify us at the above number     Remember:  Do not eat after midnight the night before your surgery.  You may drink clear liquids until 4:30 the morning of your surgery.   Clear liquids allowed are: Water, Non-Citrus Juices (without pulp), Carbonated Beverages, Clear Tea, Black Coffee ONLY (NO MILK, CREAM OR POWDERED CREAMER of any kind), and Gatorade.   Please complete your PRE-SURGERY ENSURE that was provided to you by 4:30 AM, the morning of surgery.  Please, if able, drink it in one setting. DO NOT SIP.   Take these medicines the morning of surgery with A SIP OF WATER:  amLODipine (NORVASC)  pantoprazole (PROTONIX)  rosuvastatin (CRESTOR)  tiZANidine (ZANAFLEX   If needed:  acetaminophen (TYLENOL)  albuterol Inhaler: Please bring with you the day of surgery.  antiseptic oral rinse (BIOTENE)  SYMBICORT Inhaler: please bring with you day of surgery.  traMADol Janean Sark)   Follow your surgeon's instructions on when to stop Aspirin.  If no instructions were given by your surgeon then you will need to call the office to get those instructions.    As of today, STOP taking any (unless otherwise instructed by your surgeon) Aleve, Naproxen, Ibuprofen, Motrin, Advil, Goody's, BC's, all herbal medications, fish oil, and all vitamins. This includes diclofenac (VOLTAREN).   Special instructions:    Oral Hygiene is also important to reduce your risk of infection.  Remember - BRUSH YOUR TEETH THE  MORNING OF SURGERY WITH YOUR REGULAR TOOTHPASTE   [place]- Preparing For Surgery    Pre-operative 5 CHG Bath Instructions   You can play a key role in reducing the risk of infection after surgery. Your skin needs to be as free of germs as possible. You can reduce the number of germs on your skin by washing with CHG (chlorhexidine gluconate) soap before surgery. CHG is an antiseptic soap that kills germs and continues to kill germs even after washing.   DO NOT use if you have an allergy to chlorhexidine/CHG or antibacterial soaps. If your skin becomes reddened or irritated, stop using the CHG and notify one of our RNs at [phone].   Please shower with the CHG soap starting 4 days before surgery using the following schedule:     Please keep in mind the following:  DO NOT shave, including legs and underarms, starting the day of your first shower.   You may shave your face at any point before/day of surgery.  Place clean sheets on your bed the day you start using CHG soap. Use a clean washcloth (not used since being washed) for each shower. DO NOT sleep with pets once you start using the CHG.   CHG Shower Instructions:  If you choose to wash your hair and private area, wash first with your normal shampoo/soap.  After you use shampoo/soap, rinse your hair and body thoroughly to remove shampoo/soap residue.  Turn the water OFF and apply about 3 tablespoons (45 ml) of CHG soap to a  CLEAN washcloth.  Apply CHG soap ONLY FROM YOUR NECK DOWN TO YOUR TOES (washing for 3-5 minutes)  DO NOT use CHG soap on face, private areas, open wounds, or sores.  Pay special attention to the area where your surgery is being performed.  If you are having back surgery, having someone wash your back for you may be helpful. Wait 2 minutes after CHG soap is applied, then you may rinse off the CHG soap.  Pat dry with a clean towel  Put on clean clothes/pajamas   If you choose to wear lotion, please use  ONLY the CHG-compatible lotions on the back of this paper.     Additional instructions for the day of surgery: DO NOT APPLY any lotions, deodorants, cologne, or perfumes.   Put on clean/comfortable clothes.  Brush your teeth.  Ask your nurse before applying any prescription medications to the skin.      CHG Compatible Lotions   Aveeno Moisturizing lotion  Cetaphil Moisturizing Cream  Cetaphil Moisturizing Lotion  Clairol Herbal Essence Moisturizing Lotion, Dry Skin  Clairol Herbal Essence Moisturizing Lotion, Extra Dry Skin  Clairol Herbal Essence Moisturizing Lotion, Normal Skin  Curel Age Defying Therapeutic Moisturizing Lotion with Alpha Hydroxy  Curel Extreme Care Body Lotion  Curel Soothing Hands Moisturizing Hand Lotion  Curel Therapeutic Moisturizing Cream, Fragrance-Free  Curel Therapeutic Moisturizing Lotion, Fragrance-Free  Curel Therapeutic Moisturizing Lotion, Original Formula  Eucerin Daily Replenishing Lotion  Eucerin Dry Skin Therapy Plus Alpha Hydroxy Crme  Eucerin Dry Skin Therapy Plus Alpha Hydroxy Lotion  Eucerin Original Crme  Eucerin Original Lotion  Eucerin Plus Crme Eucerin Plus Lotion  Eucerin TriLipid Replenishing Lotion  Keri Anti-Bacterial Hand Lotion  Keri Deep Conditioning Original Lotion Dry Skin Formula Softly Scented  Keri Deep Conditioning Original Lotion, Fragrance Free Sensitive Skin Formula  Keri Lotion Fast Absorbing Fragrance Free Sensitive Skin Formula  Keri Lotion Fast Absorbing Softly Scented Dry Skin Formula  Keri Original Lotion  Keri Skin Renewal Lotion Keri Silky Smooth Lotion  Keri Silky Smooth Sensitive Skin Lotion  Nivea Body Creamy Conditioning Oil  Nivea Body Extra Enriched Lotion  Nivea Body Original Lotion  Nivea Body Sheer Moisturizing Lotion Nivea Crme  Nivea Skin Firming Lotion  NutraDerm 30 Skin Lotion  NutraDerm Skin Lotion  NutraDerm Therapeutic Skin Cream  NutraDerm Therapeutic Skin Lotion  ProShield  Protective Hand Cream  Provon moisturizing lotion   [facility] is not responsible for any belongings or valuables.    Do NOT Smoke (Tobacco/Vaping)  24 hours prior to your procedure  If you use a CPAP at night, you may bring your mask for your overnight stay.   Contacts, glasses, hearing aids, dentures or partials may not be worn into surgery, please bring cases for these belongings   For patients admitted to the hospital, discharge time will be determined by your treatment team.   Patients discharged the day of surgery will not be allowed to drive home, and someone needs to stay with them for 24 hours.   SURGICAL WAITING ROOM VISITATION Patients having surgery or a procedure may have no more than 2 support people in the waiting area - these visitors may rotate.   Children under the age of 30 must have an adult with them who is not the patient. If the patient needs to stay at the hospital during part of their recovery, the visitor guidelines for inpatient rooms apply. Pre-op nurse will coordinate an appropriate time for 1 support person to accompany patient in  pre-op.  This support person may not rotate.   Please refer to [email or web address] for the visitor guidelines for Inpatients (after your surgery is over and you are in a regular room).    If you received a COVID test during your pre-op visit, it is requested that you wear a mask when out in public, stay away from anyone that may not be feeling well, and notify your surgeon if you develop symptoms. If you have been in contact with anyone that has tested positive in the last 10 days, please notify your surgeon.    Please read over the following fact sheets that you were given.

## 2023-06-15 NOTE — Progress Notes (Addendum)
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 Seen by Micah Flesher, PA-C 06/07/23 for preop eval. Per note, "Nonobstructive CAD by heart cath 05/2022. Hx of TIA following TAVR in 08/01/22. Reassuring echo 08/2022 with good valve function. According to the RCRI, he has a 0.9% risk of MACE during the perioperative period. He can complete 4.0 METS without angina. He understands his risk and wishes to proceed. He takes ASA following TIA and TAVR, but is greater than 6 months from these events. Given TIA and TAVR, would prefer to continue ASA if possible. If this will increase bleeding, can hold ASA 5-7 days and resume after surgery."  Medical clearance from PCP Dr. Aliene Beams dated 06/15/23.  Former smoker with associated COPD. Maintained on Symbicort and PRN albuterol.   Preop labs reviewed, mild/mod hyponatremia with sodium 130, otherwise unremarkable. I called the lab and had pt's sodium reanalyzed and it resulted at 134.  EKG 02/13/23: Sinus bradycardia. Rate 58. Nonspecific ST-T abnormality.   TTE 08/30/22:  1. Left ventricular ejection fraction, by estimation, is 55 to 60%. The  left ventricle has normal function. The left ventricle has no regional  wall motion abnormalities. Left ventricular diastolic parameters are  indeterminate.   2. Right ventricular systolic function is normal. The right ventricular  size is normal. Tricuspid regurgitation signal is inadequate for assessing  PA pressure.   3. Left atrial size was mild to moderately dilated.   4. The mitral valve is normal in structure. No evidence of mitral valve  regurgitation. No evidence of mitral stenosis.   5. The aortic valve has been repaired/replaced. Aortic valve  regurgitation is not visualized. There is a 29 mm Edwards Sapien  prosthetic (TAVR) valve present in the aortic position. Procedure Date:  08/01/2022. Echo findings are consistent  with normal  structure and function of the aortic valve prosthesis. Aortic valve area,  by VTI measures 2.83 cm. Aortic valve mean gradient measures 8.5 mmHg.  Aortic valve Vmax measures 2.19 m/s.   6. Aortic dilatation noted. There is borderline dilatation of the  ascending aorta, measuring 38 mm.   7. The inferior vena cava is normal in size with greater than 50%  respiratory variability, suggesting right atrial pressure of 3 mmHg.   Comparison(s): No significant change from prior study.   Cath 05/31/22: 1.  Mild obstructive coronary artery disease. 2.  Cardiac output of 6.3 L/min and index of 3.4 L/min with a mean RA pressure of 10 mmHg and a mean wedge pressure of 14 mmHg. 3.  Capacious iliofemoral vessels bilaterally.   Recommendation: Continue evaluation for aortic valve intervention.   [facility] Short Stay Center/Anesthesiology Phone [phone] 06/15/2023 1:31 PM

## 2023-06-15 NOTE — Anesthesia Preprocedure Evaluation (Addendum)
Anesthesia Evaluation  Patient identified by MRN, date of birth, ID band Patient awake    Reviewed: Allergy & Precautions, NPO status , Patient's Chart, lab work & pertinent test results  Airway Mallampati: I  TM Distance: >3 FB Neck ROM: Full    Dental no notable dental hx. (+) Teeth Intact, Dental Advisory Given   Pulmonary sleep apnea and Continuous Positive Airway Pressure Ventilation , COPD, former smoker   Pulmonary exam normal breath sounds clear to auscultation       Cardiovascular hypertension, + CAD  Normal cardiovascular exam+ Valvular Problems/Murmurs (s/p TAVR) AS  Rhythm:Regular Rate:Normal  AAA   Neuro/Psych  PSYCHIATRIC DISORDERS Anxiety Depression    CVA    GI/Hepatic Neg liver ROS,GERD  ,,  Endo/Other  negative endocrine ROS    Renal/GU negative Renal ROS  negative genitourinary   Musculoskeletal  (+) Arthritis ,    Abdominal   Peds  Hematology negative hematology ROS (+)   Anesthesia Other Findings   Reproductive/Obstetrics                             Anesthesia Physical Anesthesia Plan  ASA: 3  Anesthesia Plan: General   Post-op Pain Management: Tylenol PO (pre-op)*   Induction: Intravenous  PONV Risk Score and Plan: 2 and Dexamethasone, Ondansetron and Treatment may vary due to age or medical condition  Airway Management Planned: Oral ETT  Additional Equipment:   Intra-op Plan:   Post-operative Plan: Extubation in OR  Informed Consent: I have reviewed the patients History and Physical, chart, labs and discussed the procedure including the risks, benefits and alternatives for the proposed anesthesia with the patient or authorized representative who has indicated his/her understanding and acceptance.     Dental advisory given  Plan Discussed with: CRNA  Anesthesia Plan Comments: (2 IVs  PAT note by Antionette Poles, PA-C: Follows with cardiology for hx of  AS s/p TAVR complicated by TIA, nonobstructive CAD, HLD, HTN, OSA on CPAP. Seen by Micah Flesher, PA-C 06/07/23 for preop eval. Per note, "Nonobstructive CAD by heart cath 05/2022. Hx of TIA following TAVR in 08/01/22. Reassuring echo 08/2022 with good valve function. According to the RCRI, he has a 0.9% risk of MACE during the perioperative period. He can complete 4.0 METS without angina. He understands his risk and wishes to proceed. He takes ASA following TIA and TAVR, but is greater than 6 months from these events. Given TIA and TAVR, would prefer to continue ASA if possible. If this will increase bleeding, can hold ASA 5-7 days and resume after surgery."  Medical clearance from PCP Dr. Aliene Beams dated 06/15/23.  Former smoker with associated COPD. Maintained on Symbicort and PRN albuterol.   Preop labs reviewed, mild/mod hyponatremia with sodium 130, otherwise unremarkable. I called the lab and had pt's sodium reanalyzed and it resulted at 134.  EKG 02/13/23: Sinus bradycardia. Rate 58. Nonspecific ST-T abnormality.   TTE 08/30/22: 1. Left ventricular ejection fraction, by estimation, is 55 to 60%. The  left ventricle has normal function. The left ventricle has no regional  wall motion abnormalities. Left ventricular diastolic parameters are  indeterminate.  2. Right ventricular systolic function is normal. The right ventricular  size is normal. Tricuspid regurgitation signal is inadequate for assessing  PA pressure.  3. Left atrial size was mild to moderately dilated.  4. The mitral valve is normal in structure. No evidence of mitral valve  regurgitation. No evidence  of mitral stenosis.  5. The aortic valve has been repaired/replaced. Aortic valve  regurgitation is not visualized. There is a 29 mm Edwards Sapien  prosthetic (TAVR) valve present in the aortic position. Procedure Date:  08/01/2022. Echo findings are consistent with normal  structure and function of the aortic valve  prosthesis. Aortic valve area,  by VTI measures 2.83 cm. Aortic valve mean gradient measures 8.5 mmHg.  Aortic valve Vmax measures 2.19 m/s.  6. Aortic dilatation noted. There is borderline dilatation of the  ascending aorta, measuring 38 mm.  7. The inferior vena cava is normal in size with greater than 50%  respiratory variability, suggesting right atrial pressure of 3 mmHg.   Comparison(s): No significant change from prior study.   Cath 05/31/22: 1.  Mild obstructive coronary artery disease. 2.  Cardiac output of 6.3 L/min and index of 3.4 L/min with a mean RA pressure of 10 mmHg and a mean wedge pressure of 14 mmHg. 3.  Capacious iliofemoral vessels bilaterally.  Recommendation: Continue evaluation for aortic valve intervention.   )        Anesthesia Quick Evaluation

## 2023-06-20 DIAGNOSIS — I10 Essential (primary) hypertension: Secondary | ICD-10-CM | POA: Diagnosis not present

## 2023-06-20 DIAGNOSIS — J449 Chronic obstructive pulmonary disease, unspecified: Secondary | ICD-10-CM | POA: Diagnosis not present

## 2023-06-20 DIAGNOSIS — E782 Mixed hyperlipidemia: Secondary | ICD-10-CM | POA: Diagnosis not present

## 2023-06-21 DIAGNOSIS — M5451 Vertebrogenic low back pain: Secondary | ICD-10-CM | POA: Diagnosis not present

## 2023-06-22 NOTE — Progress Notes (Signed)
Mark Zamora, with Dr. Shon Baton' office, notified of positive PCR results

## 2023-06-25 ENCOUNTER — Inpatient Hospital Stay (HOSPITAL_COMMUNITY): Payer: Medicare Other

## 2023-06-25 ENCOUNTER — Encounter (HOSPITAL_COMMUNITY): Payer: Self-pay | Admitting: Orthopedic Surgery

## 2023-06-25 ENCOUNTER — Other Ambulatory Visit: Payer: Self-pay

## 2023-06-25 ENCOUNTER — Inpatient Hospital Stay (HOSPITAL_COMMUNITY): Payer: Medicare Other | Admitting: Anesthesiology

## 2023-06-25 ENCOUNTER — Inpatient Hospital Stay (HOSPITAL_COMMUNITY)
Admission: RE | Admit: 2023-06-25 | Discharge: 2023-06-29 | DRG: 460 | Disposition: A | Discharge status: Skilled Nursing Facility | Payer: Medicare Other | Attending: Specialist | Admitting: Specialist

## 2023-06-25 ENCOUNTER — Inpatient Hospital Stay (HOSPITAL_COMMUNITY): Payer: Medicare Other | Admitting: Physician Assistant

## 2023-06-25 ENCOUNTER — Encounter (HOSPITAL_COMMUNITY)
Admission: RE | Disposition: A | Discharge status: Skilled Nursing Facility | Payer: Self-pay | Source: Home / Self Care | Attending: Orthopedic Surgery

## 2023-06-25 DIAGNOSIS — Z952 Presence of prosthetic heart valve: Secondary | ICD-10-CM | POA: Diagnosis not present

## 2023-06-25 DIAGNOSIS — E785 Hyperlipidemia, unspecified: Secondary | ICD-10-CM | POA: Diagnosis present

## 2023-06-25 DIAGNOSIS — M4316 Spondylolisthesis, lumbar region: Secondary | ICD-10-CM | POA: Diagnosis not present

## 2023-06-25 DIAGNOSIS — J309 Allergic rhinitis, unspecified: Secondary | ICD-10-CM | POA: Diagnosis not present

## 2023-06-25 DIAGNOSIS — G8929 Other chronic pain: Secondary | ICD-10-CM | POA: Diagnosis present

## 2023-06-25 DIAGNOSIS — Z743 Need for continuous supervision: Secondary | ICD-10-CM | POA: Diagnosis not present

## 2023-06-25 DIAGNOSIS — Z79899 Other long term (current) drug therapy: Secondary | ICD-10-CM | POA: Diagnosis not present

## 2023-06-25 DIAGNOSIS — I352 Nonrheumatic aortic (valve) stenosis with insufficiency: Secondary | ICD-10-CM | POA: Diagnosis not present

## 2023-06-25 DIAGNOSIS — Z87891 Personal history of nicotine dependence: Secondary | ICD-10-CM

## 2023-06-25 DIAGNOSIS — M48062 Spinal stenosis, lumbar region with neurogenic claudication: Principal | ICD-10-CM | POA: Diagnosis present

## 2023-06-25 DIAGNOSIS — G4733 Obstructive sleep apnea (adult) (pediatric): Secondary | ICD-10-CM | POA: Diagnosis not present

## 2023-06-25 DIAGNOSIS — I739 Peripheral vascular disease, unspecified: Secondary | ICD-10-CM | POA: Diagnosis not present

## 2023-06-25 DIAGNOSIS — K219 Gastro-esophageal reflux disease without esophagitis: Secondary | ICD-10-CM | POA: Diagnosis not present

## 2023-06-25 DIAGNOSIS — F32A Depression, unspecified: Secondary | ICD-10-CM | POA: Diagnosis present

## 2023-06-25 DIAGNOSIS — I10 Essential (primary) hypertension: Secondary | ICD-10-CM

## 2023-06-25 DIAGNOSIS — R0600 Dyspnea, unspecified: Secondary | ICD-10-CM | POA: Diagnosis not present

## 2023-06-25 DIAGNOSIS — R2689 Other abnormalities of gait and mobility: Secondary | ICD-10-CM | POA: Diagnosis not present

## 2023-06-25 DIAGNOSIS — Z85828 Personal history of other malignant neoplasm of skin: Secondary | ICD-10-CM

## 2023-06-25 DIAGNOSIS — Z7951 Long term (current) use of inhaled steroids: Secondary | ICD-10-CM | POA: Diagnosis not present

## 2023-06-25 DIAGNOSIS — M6281 Muscle weakness (generalized): Secondary | ICD-10-CM | POA: Diagnosis not present

## 2023-06-25 DIAGNOSIS — Z981 Arthrodesis status: Secondary | ICD-10-CM | POA: Diagnosis not present

## 2023-06-25 DIAGNOSIS — M48061 Spinal stenosis, lumbar region without neurogenic claudication: Secondary | ICD-10-CM | POA: Diagnosis not present

## 2023-06-25 DIAGNOSIS — M4726 Other spondylosis with radiculopathy, lumbar region: Secondary | ICD-10-CM | POA: Diagnosis present

## 2023-06-25 DIAGNOSIS — M545 Low back pain, unspecified: Secondary | ICD-10-CM | POA: Diagnosis not present

## 2023-06-25 DIAGNOSIS — M2578 Osteophyte, vertebrae: Secondary | ICD-10-CM | POA: Diagnosis present

## 2023-06-25 DIAGNOSIS — I2510 Atherosclerotic heart disease of native coronary artery without angina pectoris: Secondary | ICD-10-CM

## 2023-06-25 DIAGNOSIS — N400 Enlarged prostate without lower urinary tract symptoms: Secondary | ICD-10-CM | POA: Diagnosis present

## 2023-06-25 DIAGNOSIS — Z7401 Bed confinement status: Secondary | ICD-10-CM | POA: Diagnosis not present

## 2023-06-25 DIAGNOSIS — Z888 Allergy status to other drugs, medicaments and biological substances status: Secondary | ICD-10-CM | POA: Diagnosis not present

## 2023-06-25 DIAGNOSIS — K5900 Constipation, unspecified: Secondary | ICD-10-CM | POA: Diagnosis not present

## 2023-06-25 DIAGNOSIS — I714 Abdominal aortic aneurysm, without rupture, unspecified: Secondary | ICD-10-CM | POA: Diagnosis not present

## 2023-06-25 DIAGNOSIS — Z885 Allergy status to narcotic agent status: Secondary | ICD-10-CM

## 2023-06-25 DIAGNOSIS — M4306 Spondylolysis, lumbar region: Secondary | ICD-10-CM | POA: Diagnosis not present

## 2023-06-25 DIAGNOSIS — Z7982 Long term (current) use of aspirin: Secondary | ICD-10-CM

## 2023-06-25 DIAGNOSIS — M549 Dorsalgia, unspecified: Secondary | ICD-10-CM | POA: Diagnosis present

## 2023-06-25 DIAGNOSIS — B0222 Postherpetic trigeminal neuralgia: Secondary | ICD-10-CM | POA: Diagnosis not present

## 2023-06-25 DIAGNOSIS — J449 Chronic obstructive pulmonary disease, unspecified: Secondary | ICD-10-CM | POA: Diagnosis not present

## 2023-06-25 DIAGNOSIS — F419 Anxiety disorder, unspecified: Secondary | ICD-10-CM | POA: Diagnosis present

## 2023-06-25 DIAGNOSIS — M4326 Fusion of spine, lumbar region: Secondary | ICD-10-CM | POA: Diagnosis not present

## 2023-06-25 DIAGNOSIS — Z48811 Encounter for surgical aftercare following surgery on the nervous system: Secondary | ICD-10-CM | POA: Diagnosis not present

## 2023-06-25 DIAGNOSIS — M25552 Pain in left hip: Secondary | ICD-10-CM | POA: Diagnosis present

## 2023-06-25 DIAGNOSIS — Z79890 Hormone replacement therapy (postmenopausal): Secondary | ICD-10-CM | POA: Diagnosis not present

## 2023-06-25 DIAGNOSIS — M542 Cervicalgia: Secondary | ICD-10-CM | POA: Diagnosis present

## 2023-06-25 DIAGNOSIS — R531 Weakness: Secondary | ICD-10-CM | POA: Diagnosis not present

## 2023-06-25 DIAGNOSIS — G4700 Insomnia, unspecified: Secondary | ICD-10-CM | POA: Diagnosis not present

## 2023-06-25 SURGERY — POSTERIOR LUMBAR FUSION 1 LEVEL
Anesthesia: General

## 2023-06-25 MED ORDER — FENTANYL CITRATE (PF) 100 MCG/2ML IJ SOLN
25.0000 ug | INTRAMUSCULAR | Status: DC | PRN
  Administered 2023-06-25 (×2): 25 ug via INTRAVENOUS

## 2023-06-25 MED ORDER — EPHEDRINE SULFATE-NACL 50-0.9 MG/10ML-% IV SOSY
PREFILLED_SYRINGE | INTRAVENOUS | Status: DC | PRN
Start: 2023-06-25 — End: 2023-06-25
  Administered 2023-06-25: 5 mg via INTRAVENOUS
  Administered 2023-06-25: 15 mg via INTRAVENOUS
  Administered 2023-06-25: 5 mg via INTRAVENOUS

## 2023-06-25 MED ORDER — LACTATED RINGERS IV SOLN: INTRAVENOUS | Status: DC

## 2023-06-25 MED ORDER — POLYETHYLENE GLYCOL 3350 17 G PO PACK
17.0000 g | PACK | Freq: Every day | ORAL | Status: DC | PRN
  Administered 2023-06-28 (×2): 17 g via ORAL
  Filled 2023-06-25 (×2): qty 1

## 2023-06-25 MED ORDER — LIDOCAINE 2% (20 MG/ML) 5 ML SYRINGE
INTRAMUSCULAR | Status: DC | PRN
  Administered 2023-06-25: 60 mg via INTRAVENOUS

## 2023-06-25 MED ORDER — DOXAZOSIN MESYLATE 8 MG PO TABS
8.0000 mg | ORAL_TABLET | Freq: Every day | ORAL | Status: DC
  Administered 2023-06-25 – 2023-06-28 (×3): 8 mg via ORAL
  Filled 2023-06-25 (×5): qty 1

## 2023-06-25 MED ORDER — VANCOMYCIN HCL IN DEXTROSE 1-5 GM/200ML-% IV SOLN
1000.0000 mg | INTRAVENOUS | Status: AC
  Administered 2023-06-25: 1000 mg via INTRAVENOUS
  Filled 2023-06-25: qty 200

## 2023-06-25 MED ORDER — LACTATED RINGERS IV SOLN: INTRAVENOUS | Status: DC | PRN

## 2023-06-25 MED ORDER — DEXAMETHASONE SODIUM PHOSPHATE 10 MG/ML IJ SOLN
INTRAMUSCULAR | Status: DC | PRN
  Administered 2023-06-25: 10 mg via INTRAVENOUS

## 2023-06-25 MED ORDER — ONDANSETRON HCL 4 MG/2ML IJ SOLN
INTRAMUSCULAR | Status: DC | PRN
  Administered 2023-06-25: 4 mg via INTRAVENOUS

## 2023-06-25 MED ORDER — BUPIVACAINE-EPINEPHRINE (PF) 0.25% -1:200000 IJ SOLN
INTRAMUSCULAR | Status: AC
  Filled 2023-06-25: qty 30

## 2023-06-25 MED ORDER — ROSUVASTATIN CALCIUM 5 MG PO TABS
10.0000 mg | ORAL_TABLET | Freq: Every day | ORAL | Status: DC
  Administered 2023-06-26 – 2023-06-29 (×4): 10 mg via ORAL
  Filled 2023-06-25 (×4): qty 2

## 2023-06-25 MED ORDER — METHOCARBAMOL 500 MG PO TABS
500.0000 mg | ORAL_TABLET | Freq: Four times a day (QID) | ORAL | Status: DC | PRN
  Administered 2023-06-25 – 2023-06-28 (×11): 500 mg via ORAL
  Filled 2023-06-25 (×12): qty 1

## 2023-06-25 MED ORDER — ONDANSETRON HCL 4 MG PO TABS
4.0000 mg | ORAL_TABLET | Freq: Four times a day (QID) | ORAL | Status: DC | PRN
  Administered 2023-06-28 (×2): 4 mg via ORAL
  Filled 2023-06-25 (×2): qty 1

## 2023-06-25 MED ORDER — BUPIVACAINE HCL (PF) 0.25 % IJ SOLN
INTRAMUSCULAR | Status: AC
  Filled 2023-06-25: qty 30

## 2023-06-25 MED ORDER — SENNOSIDES-DOCUSATE SODIUM 8.6-50 MG PO TABS
1.0000 | ORAL_TABLET | Freq: Two times a day (BID) | ORAL | Status: DC
  Administered 2023-06-25 – 2023-06-29 (×8): 1 via ORAL
  Filled 2023-06-25 (×8): qty 1

## 2023-06-25 MED ORDER — MELATONIN 5 MG PO TABS
20.0000 mg | ORAL_TABLET | Freq: Once | ORAL | Status: AC
  Administered 2023-06-25: 20 mg via ORAL
  Filled 2023-06-25: qty 4

## 2023-06-25 MED ORDER — METHOCARBAMOL 500 MG PO TABS: 500.0000 mg | ORAL_TABLET | Freq: Three times a day (TID) | ORAL | 0 refills | Status: AC | PRN

## 2023-06-25 MED ORDER — MOMETASONE FURO-FORMOTEROL FUM 200-5 MCG/ACT IN AERO
2.0000 | INHALATION_SPRAY | Freq: Two times a day (BID) | RESPIRATORY_TRACT | Status: DC
  Administered 2023-06-26 – 2023-06-29 (×6): 2 via RESPIRATORY_TRACT
  Filled 2023-06-25: qty 8.8

## 2023-06-25 MED ORDER — SPIRONOLACTONE 12.5 MG HALF TABLET
12.5000 mg | ORAL_TABLET | ORAL | Status: DC
  Administered 2023-06-26 – 2023-06-28 (×2): 12.5 mg via ORAL
  Filled 2023-06-25 (×3): qty 1

## 2023-06-25 MED ORDER — FENTANYL CITRATE (PF) 250 MCG/5ML IJ SOLN
INTRAMUSCULAR | Status: DC | PRN
  Administered 2023-06-25: 100 ug via INTRAVENOUS

## 2023-06-25 MED ORDER — BUPIVACAINE-EPINEPHRINE 0.25% -1:200000 IJ SOLN
INTRAMUSCULAR | Status: DC | PRN
  Administered 2023-06-25: 10 mL

## 2023-06-25 MED ORDER — ZOLPIDEM TARTRATE 5 MG PO TABS
5.0000 mg | ORAL_TABLET | Freq: Every evening | ORAL | Status: DC | PRN
  Administered 2023-06-25 – 2023-06-27 (×2): 5 mg via ORAL
  Filled 2023-06-25 (×2): qty 1

## 2023-06-25 MED ORDER — ACETAMINOPHEN 325 MG PO TABS
650.0000 mg | ORAL_TABLET | ORAL | Status: DC | PRN
  Administered 2023-06-27 – 2023-06-29 (×2): 650 mg via ORAL
  Filled 2023-06-25 (×2): qty 2

## 2023-06-25 MED ORDER — SODIUM CHLORIDE 0.9 % IV SOLN
250.0000 mL | INTRAVENOUS | Status: DC
  Administered 2023-06-25: 250 mL via INTRAVENOUS

## 2023-06-25 MED ORDER — OXYCODONE-ACETAMINOPHEN 10-325 MG PO TABS: 1.0000 | ORAL_TABLET | Freq: Four times a day (QID) | ORAL | 0 refills | Status: AC | PRN

## 2023-06-25 MED ORDER — DEXAMETHASONE SODIUM PHOSPHATE 10 MG/ML IJ SOLN
INTRAMUSCULAR | Status: AC
  Filled 2023-06-25: qty 1

## 2023-06-25 MED ORDER — TRANEXAMIC ACID-NACL 1000-0.7 MG/100ML-% IV SOLN
1000.0000 mg | INTRAVENOUS | Status: AC
  Administered 2023-06-25: 1000 mg via INTRAVENOUS
  Filled 2023-06-25: qty 100

## 2023-06-25 MED ORDER — SODIUM CHLORIDE 0.9 % IV SOLN
0.1500 ug/kg/min | INTRAVENOUS | Status: DC
  Filled 2023-06-25: qty 2000

## 2023-06-25 MED ORDER — ALBUMIN HUMAN 5 % IV SOLN: INTRAVENOUS | Status: DC | PRN

## 2023-06-25 MED ORDER — ACETAMINOPHEN 650 MG RE SUPP: 650.0000 mg | RECTAL | Status: DC | PRN

## 2023-06-25 MED ORDER — PROPOFOL 10 MG/ML IV BOLUS
INTRAVENOUS | Status: DC | PRN
  Administered 2023-06-25: 30 mg via INTRAVENOUS
  Administered 2023-06-25: 50 mg via INTRAVENOUS
  Administered 2023-06-25: 150 mg via INTRAVENOUS
  Administered 2023-06-25 (×2): 30 mg via INTRAVENOUS

## 2023-06-25 MED ORDER — CEFAZOLIN SODIUM-DEXTROSE 2-4 GM/100ML-% IV SOLN
2.0000 g | INTRAVENOUS | Status: DC
  Filled 2023-06-25: qty 100

## 2023-06-25 MED ORDER — POTASSIUM CHLORIDE CRYS ER 20 MEQ PO TBCR
20.0000 meq | EXTENDED_RELEASE_TABLET | Freq: Every day | ORAL | Status: DC
  Administered 2023-06-25 – 2023-06-29 (×5): 20 meq via ORAL
  Filled 2023-06-25 (×5): qty 1

## 2023-06-25 MED ORDER — PROPOFOL 10 MG/ML IV BOLUS
INTRAVENOUS | Status: AC
  Filled 2023-06-25: qty 20

## 2023-06-25 MED ORDER — AMLODIPINE BESYLATE 10 MG PO TABS
10.0000 mg | ORAL_TABLET | Freq: Every day | ORAL | Status: DC
  Administered 2023-06-26 – 2023-06-29 (×4): 10 mg via ORAL
  Filled 2023-06-25 (×4): qty 1

## 2023-06-25 MED ORDER — MAGNESIUM CITRATE PO SOLN
1.0000 | Freq: Once | ORAL | Status: AC | PRN
  Administered 2023-06-26: 1 via ORAL
  Filled 2023-06-25: qty 296

## 2023-06-25 MED ORDER — MIDAZOLAM HCL 2 MG/2ML IJ SOLN
INTRAMUSCULAR | Status: AC
  Filled 2023-06-25: qty 2

## 2023-06-25 MED ORDER — ONDANSETRON HCL 4 MG PO TABS: 4.0000 mg | ORAL_TABLET | Freq: Three times a day (TID) | ORAL | 0 refills | Status: AC | PRN

## 2023-06-25 MED ORDER — PHENOL 1.4 % MT LIQD: 1.0000 | OROMUCOSAL | Status: DC | PRN

## 2023-06-25 MED ORDER — MENTHOL 3 MG MT LOZG: 1.0000 | LOZENGE | OROMUCOSAL | Status: DC | PRN

## 2023-06-25 MED ORDER — SODIUM CHLORIDE 0.9% FLUSH
3.0000 mL | Freq: Two times a day (BID) | INTRAVENOUS | Status: DC
  Administered 2023-06-27: 3 mL via INTRAVENOUS

## 2023-06-25 MED ORDER — ALBUTEROL SULFATE HFA 108 (90 BASE) MCG/ACT IN AERS: 1.0000 | INHALATION_SPRAY | Freq: Four times a day (QID) | RESPIRATORY_TRACT | Status: DC | PRN

## 2023-06-25 MED ORDER — SUCCINYLCHOLINE CHLORIDE 200 MG/10ML IV SOSY
PREFILLED_SYRINGE | INTRAVENOUS | Status: AC
  Filled 2023-06-25: qty 10

## 2023-06-25 MED ORDER — SUCCINYLCHOLINE CHLORIDE 200 MG/10ML IV SOSY
PREFILLED_SYRINGE | INTRAVENOUS | Status: DC | PRN
  Administered 2023-06-25: 80 mg via INTRAVENOUS

## 2023-06-25 MED ORDER — THROMBIN 20000 UNITS EX SOLR
CUTANEOUS | Status: AC
  Filled 2023-06-25: qty 20000

## 2023-06-25 MED ORDER — OXYCODONE HCL 5 MG PO TABS
10.0000 mg | ORAL_TABLET | ORAL | Status: DC | PRN
  Administered 2023-06-25 – 2023-06-29 (×18): 10 mg via ORAL
  Filled 2023-06-25 (×18): qty 2

## 2023-06-25 MED ORDER — CHLORHEXIDINE GLUCONATE 0.12 % MT SOLN
15.0000 mL | Freq: Once | OROMUCOSAL | Status: AC
  Administered 2023-06-25: 15 mL via OROMUCOSAL

## 2023-06-25 MED ORDER — METHOCARBAMOL 1000 MG/10ML IJ SOLN: 500.0000 mg | Freq: Four times a day (QID) | INTRAVENOUS | Status: DC | PRN

## 2023-06-25 MED ORDER — FLUTICASONE PROPIONATE 50 MCG/ACT NA SUSP
2.0000 | Freq: Every day | NASAL | Status: DC
  Administered 2023-06-25 – 2023-06-26 (×2): 2 via NASAL
  Filled 2023-06-25: qty 16

## 2023-06-25 MED ORDER — PHENYLEPHRINE HCL-NACL 20-0.9 MG/250ML-% IV SOLN
INTRAVENOUS | Status: DC | PRN
  Administered 2023-06-25: 15 ug/min via INTRAVENOUS

## 2023-06-25 MED ORDER — FENTANYL CITRATE (PF) 100 MCG/2ML IJ SOLN
INTRAMUSCULAR | Status: AC
  Administered 2023-06-25: 50 ug via INTRAVENOUS
  Filled 2023-06-25: qty 2

## 2023-06-25 MED ORDER — EPHEDRINE 5 MG/ML INJ
INTRAVENOUS | Status: AC
  Filled 2023-06-25: qty 5

## 2023-06-25 MED ORDER — ACETAMINOPHEN 500 MG PO TABS
1000.0000 mg | ORAL_TABLET | Freq: Once | ORAL | Status: AC
  Administered 2023-06-25: 1000 mg via ORAL
  Filled 2023-06-25: qty 2

## 2023-06-25 MED ORDER — GELATIN ABSORBABLE 100 EX MISC
CUTANEOUS | Status: DC | PRN
  Administered 2023-06-25: 1

## 2023-06-25 MED ORDER — LIDOCAINE 2% (20 MG/ML) 5 ML SYRINGE
INTRAMUSCULAR | Status: AC
  Filled 2023-06-25: qty 5

## 2023-06-25 MED ORDER — BUPIVACAINE LIPOSOME 1.3 % IJ SUSP
INTRAMUSCULAR | Status: AC
  Filled 2023-06-25: qty 20

## 2023-06-25 MED ORDER — PROPOFOL 500 MG/50ML IV EMUL
INTRAVENOUS | Status: DC | PRN
  Administered 2023-06-25: 125 ug/kg/min via INTRAVENOUS
  Administered 2023-06-25: 50 ug/kg/min via INTRAVENOUS

## 2023-06-25 MED ORDER — AMOXICILLIN 500 MG PO TABS: 2000.0000 mg | ORAL_TABLET | ORAL | Status: DC

## 2023-06-25 MED ORDER — 0.9 % SODIUM CHLORIDE (POUR BTL) OPTIME
TOPICAL | Status: DC | PRN
  Administered 2023-06-25 (×3): 1000 mL

## 2023-06-25 MED ORDER — SODIUM CHLORIDE 0.9% FLUSH: 3.0000 mL | INTRAVENOUS | Status: DC | PRN

## 2023-06-25 MED ORDER — ONDANSETRON HCL 4 MG/2ML IJ SOLN: 4.0000 mg | Freq: Four times a day (QID) | INTRAMUSCULAR | Status: DC | PRN

## 2023-06-25 MED ORDER — THROMBIN 20000 UNITS EX SOLR
CUTANEOUS | Status: DC | PRN
  Administered 2023-06-25 (×2): 10 mL via TOPICAL

## 2023-06-25 MED ORDER — FENTANYL CITRATE (PF) 250 MCG/5ML IJ SOLN
INTRAMUSCULAR | Status: AC
  Filled 2023-06-25: qty 5

## 2023-06-25 MED ORDER — ORAL CARE MOUTH RINSE: 15.0000 mL | Freq: Once | OROMUCOSAL | Status: AC

## 2023-06-25 MED ORDER — ONDANSETRON HCL 4 MG/2ML IJ SOLN
INTRAMUSCULAR | Status: AC
  Filled 2023-06-25: qty 2

## 2023-06-25 MED ORDER — OXYCODONE HCL 5 MG PO TABS: 5.0000 mg | ORAL_TABLET | ORAL | Status: DC | PRN

## 2023-06-25 MED ORDER — PROPOFOL 1000 MG/100ML IV EMUL
INTRAVENOUS | Status: AC
  Filled 2023-06-25: qty 100

## 2023-06-25 MED ORDER — CEFAZOLIN SODIUM-DEXTROSE 1-4 GM/50ML-% IV SOLN
1.0000 g | Freq: Three times a day (TID) | INTRAVENOUS | Status: AC
  Administered 2023-06-25 (×2): 1 g via INTRAVENOUS
  Filled 2023-06-25 (×2): qty 50

## 2023-06-25 MED ORDER — LISINOPRIL 20 MG PO TABS
40.0000 mg | ORAL_TABLET | Freq: Every day | ORAL | Status: DC
  Administered 2023-06-25 – 2023-06-29 (×5): 40 mg via ORAL
  Filled 2023-06-25 (×5): qty 2

## 2023-06-25 MED ORDER — SODIUM CHLORIDE 0.9 % IV SOLN
0.1500 ug/kg/min | INTRAVENOUS | Status: AC
  Administered 2023-06-25: .05 ug/kg/min via INTRAVENOUS
  Filled 2023-06-25: qty 2000

## 2023-06-25 SURGICAL SUPPLY — 73 items
AGENT HMST KT MTR STRL THRMB (HEMOSTASIS)
BAG COUNTER SPONGE SURGICOUNT (BAG) ×1 IMPLANT
BAG SPNG CNTER NS LX DISP (BAG) ×1
BLADE CLIPPER SURG (BLADE) IMPLANT
BUR EGG ELITE 4.0 (BURR) IMPLANT
BUR MATCHSTICK NEURO 3.0 LAGG (BURR) IMPLANT
CABLE BIPOLOR RESECTION CORD (MISCELLANEOUS) ×1 IMPLANT
CANISTER SUCT 3000ML PPV (MISCELLANEOUS) ×1 IMPLANT
CAP RELINE MOD TULIP RMM (Cap) IMPLANT
CLSR STERI-STRIP ANTIMIC 1/2X4 (GAUZE/BANDAGES/DRESSINGS) ×1 IMPLANT
CONNECTOR RELINE-O 5.0 30-35 (Connector) IMPLANT
COVER SURGICAL LIGHT HANDLE (MISCELLANEOUS) ×1 IMPLANT
DRAIN CHANNEL 15F RND FF W/TCR (WOUND CARE) IMPLANT
DRAIN JP 15F RND TROCAR (DRAIN) IMPLANT
DRAPE C-ARM 42X72 X-RAY (DRAPES) ×1 IMPLANT
DRAPE C-ARMOR (DRAPES) ×1 IMPLANT
DRAPE POUCH INSTRU U-SHP 10X18 (DRAPES) ×1 IMPLANT
DRAPE SURG 17X23 STRL (DRAPES) ×1 IMPLANT
DRAPE U-SHAPE 47X51 STRL (DRAPES) ×1 IMPLANT
DRSG OPSITE POSTOP 4X6 (GAUZE/BANDAGES/DRESSINGS) IMPLANT
DRSG OPSITE POSTOP 4X8 (GAUZE/BANDAGES/DRESSINGS) ×1 IMPLANT
DURAPREP 26ML APPLICATOR (WOUND CARE) ×1 IMPLANT
ELECT BLADE 4.0 EZ CLEAN MEGAD (MISCELLANEOUS) ×1
ELECT BLADE 6.5 EXT (BLADE) IMPLANT
ELECT PENCIL ROCKER SW 15FT (MISCELLANEOUS) ×1 IMPLANT
ELECT REM PT RETURN 9FT ADLT (ELECTROSURGICAL) ×1
ELECTRODE BLDE 4.0 EZ CLN MEGD (MISCELLANEOUS) ×1 IMPLANT
ELECTRODE REM PT RTRN 9FT ADLT (ELECTROSURGICAL) ×1 IMPLANT
EVACUATOR SILICONE 100CC (DRAIN) IMPLANT
GAUZE SPONGE 4X4 12PLY STRL (GAUZE/BANDAGES/DRESSINGS) IMPLANT
GLOVE BIOGEL PI IND STRL 8.5 (GLOVE) ×1 IMPLANT
GLOVE SS BIOGEL STRL SZ 8.5 (GLOVE) ×1 IMPLANT
GOWN STRL REUS W/ TWL LRG LVL3 (GOWN DISPOSABLE) ×1 IMPLANT
GOWN STRL REUS W/TWL 2XL LVL3 (GOWN DISPOSABLE) ×2 IMPLANT
GOWN STRL REUS W/TWL LRG LVL3 (GOWN DISPOSABLE) ×4
KIT BASIN OR (CUSTOM PROCEDURE TRAY) ×1 IMPLANT
KIT POSITION SURG JACKSON T1 (MISCELLANEOUS) IMPLANT
KIT TURNOVER KIT B (KITS) ×1 IMPLANT
NDL 22X1.5 STRL (OR ONLY) (MISCELLANEOUS) ×1 IMPLANT
NDL SPNL 18GX3.5 QUINCKE PK (NEEDLE) ×2 IMPLANT
NEEDLE 22X1.5 STRL (OR ONLY) (MISCELLANEOUS) ×1 IMPLANT
NEEDLE SPNL 18GX3.5 QUINCKE PK (NEEDLE) ×2 IMPLANT
NS IRRIG 1000ML POUR BTL (IV SOLUTION) ×1 IMPLANT
PACK LAMINECTOMY ORTHO (CUSTOM PROCEDURE TRAY) ×1 IMPLANT
PACK UNIVERSAL I (CUSTOM PROCEDURE TRAY) ×1 IMPLANT
PAD ARMBOARD 7.5X6 YLW CONV (MISCELLANEOUS) ×2 IMPLANT
PATTIES SURGICAL .5 X.5 (GAUZE/BANDAGES/DRESSINGS) IMPLANT
PATTIES SURGICAL .5 X1 (DISPOSABLE) ×1 IMPLANT
POSITIONER HEAD PRONE TRACH (MISCELLANEOUS) ×1 IMPLANT
ROD RELINE 5.0X45MM (Rod) IMPLANT
ROD RELINE COCR LORD 5X40MM (Rod) IMPLANT
SCREW LOCK RSS 4.5/5.0MM (Screw) IMPLANT
SCREW SHANK RELINE MOD 5.5X35 (Screw) IMPLANT
SHANK RELINE MOD 5.5X40 (Screw) IMPLANT
SPONGE SURGIFOAM ABS GEL 100 (HEMOSTASIS) ×1 IMPLANT
SPONGE T-LAP 4X18 ~~LOC~~+RFID (SPONGE) ×2 IMPLANT
SURGIFLO W/THROMBIN 8M KIT (HEMOSTASIS) IMPLANT
SUT BONE WAX W31G (SUTURE) ×1 IMPLANT
SUT MNCRL AB 3-0 PS2 27 (SUTURE) ×2 IMPLANT
SUT MNCRL+ AB 3-0 CT1 36 (SUTURE) IMPLANT
SUT STRATAFIX PDO 1 14 VIOLET (SUTURE) ×1
SUT STRATFX PDO 1 14 VIOLET (SUTURE) ×1
SUT VIC AB 1 CT1 18XCR BRD 8 (SUTURE) ×1 IMPLANT
SUT VIC AB 1 CT1 8-18 (SUTURE) ×1
SUT VIC AB 2-0 CT1 18 (SUTURE) ×1 IMPLANT
SUTURE STRATFX PDO 1 14 VIOLET (SUTURE) IMPLANT
SYR BULB IRRIG 60ML STRL (SYRINGE) ×1 IMPLANT
SYR CONTROL 10ML LL (SYRINGE) ×1 IMPLANT
TOWEL GREEN STERILE (TOWEL DISPOSABLE) ×1 IMPLANT
TOWEL GREEN STERILE FF (TOWEL DISPOSABLE) ×1 IMPLANT
TRAY FOLEY MTR SLVR 16FR STAT (SET/KITS/TRAYS/PACK) ×1 IMPLANT
WATER STERILE IRR 1000ML POUR (IV SOLUTION) ×1 IMPLANT
YANKAUER SUCT BULB TIP NO VENT (SUCTIONS) ×1 IMPLANT

## 2023-06-25 NOTE — Transfer of Care (Signed)
Immediate Anesthesia Transfer of Care Note  Patient: Mark Zamora  Procedure(s) Performed: POSTERIOR LUMBAR FUSION INTERBODY WITH DECOMPRESSION L3-4  Patient Location: PACU  Anesthesia Type:General  Level of Consciousness: drowsy, patient cooperative, and responds to stimulation  Airway & Oxygen Therapy: Patient Spontanous Breathing and Patient connected to face mask oxygen  Post-op Assessment: Report given to RN and Post -op Vital signs reviewed and stable  Post vital signs: Reviewed and stable  Last Vitals:  Vitals Value Taken Time  BP 124/64 06/25/23 1215  Temp    Pulse 70 06/25/23 1216  Resp 20 06/25/23 1216  SpO2 99 % 06/25/23 1216  Vitals shown include unfiled device data.  Last Pain:  Vitals:   06/25/23 0706  TempSrc:   PainSc: 6       Patients Stated Pain Goal: 3 (06/25/23 0706)  Complications: No notable events documented.

## 2023-06-25 NOTE — Anesthesia Procedure Notes (Signed)
Procedure Name: Intubation Date/Time: 06/25/2023 7:55 AM  Performed by: Shary Decamp, CRNAPre-anesthesia Checklist: Patient identified, Patient being monitored, Timeout performed, Emergency Drugs available and Suction available Patient Re-evaluated:Patient Re-evaluated prior to induction Oxygen Delivery Method: Circle system utilized Preoxygenation: Pre-oxygenation with 100% oxygen Induction Type: IV induction Ventilation: Mask ventilation without difficulty Laryngoscope Size: 3 and Miller Grade View: Grade I Tube type: Oral Tube size: 8.0 mm Number of attempts: 1 Airway Equipment and Method: Stylet Placement Confirmation: ETT inserted through vocal cords under direct vision, positive ETCO2 and breath sounds checked- equal and bilateral Secured at: 23 cm Tube secured with: Tape Dental Injury: Teeth and Oropharynx as per pre-operative assessment

## 2023-06-25 NOTE — Discharge Instructions (Signed)

## 2023-06-25 NOTE — Progress Notes (Signed)
Placed patient on home bipap settings of IPAP of 12cm and EPAP of 8cm. Patient brought home nasal pillows.

## 2023-06-25 NOTE — Brief Op Note (Signed)
06/25/2023  12:05 PM  PATIENT:  Mark Zamora  77 y.o. 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

## 2023-06-25 NOTE — H&P (Signed)
History: Mark Zamora is a very pleasant 77 year old gentleman is having progressive back buttock and neuropathic leg pain. He states the neuropathic leg pain is debilitating and has significantly hindered his quality of life. The pain is predominantly in the posterior and lateral hamstring to the level of the knee. No symptoms below the knee. Clinical exam is consistent with lumbar spinal stenosis with neurogenic claudication. As a result of the failure to improve with conservative management we have elected to move forward with a lumbar decompression and instrumented fusion.  Past Medical History:  Diagnosis Date   AAA (abdominal aortic aneurysm) (HCC) 01/29/2017   2.9cm   Allergic rhinitis    Ambulates with cane    Three prong cane   Anxiety    Arthritis    l ankle   BPH (benign prostatic hypertrophy)    Cancer (HCC)    HX SKIN CANCER - right/back of shoulder   Cataract, bilateral    Chronic back pain    Chronic left hip pain    Constipation    COPD (chronic obstructive pulmonary disease) (HCC) 2020   Depression    Dyspnea    with exertion   Essential hypertension    GERD (gastroesophageal reflux disease)    History of chronic sinusitis    History of skin cancer    Hyperlipidemia    Insomnia    Neck pain, chronic    Nocturia    OSA on CPAP    uses cpap nightly   Pedal edema    Chronic   Postherpetic neuralgia    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 Lynnette Caffey & Dr. Leafy Ro   Severe aortic stenosis    Shingles    Spinal stenosis     Allergies  Allergen Reactions   Compazine [Prochlorperazine Edisylate] Anaphylaxis and Other (See Comments)    Pt states "any nausea med ending in "zine"  Seizures   Cymbalta [Duloxetine Hcl] Other (See Comments)    Urinary retention    Promethazine Anaphylaxis and Other (See Comments)    seizures   Codeine Other (See Comments)    constipation    Other Other (See Comments)    All antiemetic meds ending -zine  Prefers to not take any narcotic pain meds    No current facility-administered medications on file prior to encounter.   Current Outpatient Medications on File Prior to Encounter  Medication Sig Dispense Refill   acetaminophen (TYLENOL) 650 MG CR tablet Take 1,300 mg by mouth every 8 (eight) hours as needed for pain.     albuterol (VENTOLIN HFA) 108 (90 Base) MCG/ACT inhaler Inhale 1-2 puffs into the lungs every 6 (six) hours as needed for shortness of breath or wheezing.     amLODipine (NORVASC) 10 MG tablet Take 10 mg by mouth daily.     antiseptic oral rinse (BIOTENE) LIQD 15 mLs by Mouth Rinse route as needed for dry mouth.     aspirin EC 81 MG tablet Take 1 tablet (81 mg total) by mouth daily. Swallow whole. 90 tablet 3   budesonide-formoterol (SYMBICORT) 160-4.5 MCG/ACT inhaler Inhale 2 puffs into the lungs 2 (two) times daily.     Cholecalciferol (VITAMIN D3) 50 MCG (2000 UT) capsule Take 2,000 Units by mouth daily.     diclofenac (VOLTAREN) 75 MG EC tablet Take 75 mg by mouth daily.     doxazosin (CARDURA) 8 MG tablet  Take 8 mg by mouth at bedtime.     famotidine (PEPCID) 40 MG tablet Take 40 mg by mouth every evening.     fluticasone (FLONASE) 50 MCG/ACT nasal spray Place 2 sprays into both nostrils at bedtime.     lisinopril (ZESTRIL) 40 MG tablet Take 1 tablet (40 mg total) by mouth daily.     Melatonin 10 MG TABS Take 20 mg by mouth at bedtime.     NON FORMULARY Pt uses a cpap nightly     pantoprazole (PROTONIX) 40 MG tablet TAKE ONE TABLET BY MOUTH ONE TIME DAILY 90 tablet 3   potassium chloride SA (K-DUR,KLOR-CON) 20 MEQ tablet Take 20 mEq by mouth daily.      Psyllium (METAMUCIL) 28.3 % POWD Take 1 Scoop by mouth daily.     rosuvastatin (CRESTOR) 10 MG tablet TAKE ONE TABLET BY MOUTH ONE TIME DAILY 90 tablet 3   spironolactone (ALDACTONE) 25 MG tablet Take 0.5 tablets (12.5 mg total) by mouth daily. Take 6.25  mg by mouth daily. (Patient taking differently: Take 6.25 mg by mouth daily.)     tiZANidine (ZANAFLEX) 4 MG capsule Take 1 capsule (4 mg total) by mouth 3 (three) times daily. 90 capsule 0   traMADol (ULTRAM) 50 MG tablet Take 1-2 tablets (50-100 mg total) by mouth every 6 (six) hours as needed (mild pain). (Patient taking differently: Take 50 mg by mouth every 4 (four) hours as needed for moderate pain. PT STATES HE TAKES 4 OR 6 TABLETS DAILY) 56 tablet 0   vitamin E 180 MG (400 UNITS) capsule Take 400 Units by mouth daily.     zolpidem (AMBIEN CR) 12.5 MG CR tablet Take 1 tablet by mouth once daily at bedtime as needed (Patient taking differently: Take 12.5 mg by mouth at bedtime.) 30 tablet 0   amoxicillin (AMOXIL) 500 MG tablet Take 4 tablets (2,000 mg total) by mouth as directed. 1 HOUR PRIOR TO DENTAL APPOINTMENTS 12 tablet 6    Physical Exam: Vitals:   06/25/23 0607  BP: (!) 148/75  Pulse: (!) 52  Resp: 17  Temp: 97.8 F (36.6 C)  SpO2: 96%   Body mass index is 29.87 kg/m. Clinical exam: Mark Zamora is a pleasant individual, who appears younger than their stated age.  He is alert and orientated 3.  No shortness of breath, chest pain.  Abdomen is soft and non-tender, negative loss of bowel and bladder control, no rebound tenderness.  Negative: skin lesions abrasions contusions  Peripheral pulses: 2+ peripheral pulses bilaterally.  LE compartments are: Soft and nontender.  Gait pattern: Altered gait pattern due to severe back pain.  Assistive devices: Cane  Neuro: Positive numbness and dysesthesias into the lower extremity bilaterally. 5/5 motor strength in the lower extremity bilaterally. Negative straight leg raise test, negative Babinski test, no clonus. Symmetrical 1+ deep tendon reflexes.  Musculoskeletal: Significant low back pain with range of motion and palpation. Patient is unable to return to the neutral position due to exacerbation of back and neuropathic leg  pain.  Imaging: X-rays demonstrate grade 1 borderline 2 degenerative spondylolisthesis at L3-4. He also has significant peripheral vascular disease with calcifications in the descending vessels.   Lumbar MRI: completed on 05/26/2023. Progression of the spinal stenosis at L3-4 with significant central and lateral recess stenosis. Stable grade 1 anterior listhesis at L3-4. Unchanged facet arthropathy at L4-5 with mild central canal stenosis and lateral recess stenosis. There is moderate to severe left foraminal stenosis at L4-5.  A/P:  Mark Zamora is a very pleasant 77 year old gentleman is having progressive back buttock and neuropathic leg pain. He states the neuropathic leg pain is debilitating and has significantly hindered his quality of life. The pain is predominantly in the posterior and lateral hamstring to the level of the knee. No symptoms below the knee. Clinical exam is consistent with lumbar spinal stenosis with neurogenic claudication. Imaging shows spondylolisthesis at L3-4 with advanced spinal stenosis at L3-4. He does have some degenerative changes at L4-5 but his principal area of compression is at L3-4. Based on his imaging studies, and clinical complaints I do think surgical intervention is reasonable. To address the spinal stenosis he will need a lumbar decompression. However because of the spondylolisthesis I will then supplement this with a posterior pedicle screw construct with lateral mass fusion. I do not think interbody cages are required. I have gone over the surgical procedure with him in great detail which would be a posterior L3-4 decompression with pedicle screw fixation and posterior lateral arthrodesis. Risks and benefits of surgery were discussed. Risks and benefits of spinal fusion: Infection, bleeding, death, stroke, paralysis, ongoing or worse pain, need for additional surgery, nonunion, leak of spinal fluid, adjacent segment degeneration requiring additional fusion surgery,  Injury to abdominal vessels that can require anterior surgery to stop bleeding. Malposition of the cage and/or pedicle screws that could require additional surgery. Loss of bowel and bladder control. Postoperative hematoma causing neurologic compression that could require urgent or emergent re-operation. Risks and benefits of decompression/discectomy: Infection, bleeding, death, stroke, paralysis, ongoing or worse pain, need for additional surgery, leak of spinal fluid, adjacent segment degeneration requiring additional surgery, post-operative hematoma formation that can result in neurological compromise and the need for urgent/emergent re-operation. Loss in bowel and bladder control. Injury to major vessels that could result in the need for urgent abdominal surgery to stop bleeding. Risk of deep venous thrombosis (DVT) and the need for additional treatment. Recurrent disc herniation resulting in the need for revision surgery, which could include fusion surgery (utilizing instrumentation such as pedicle screws and intervertebral cages). Additional risk: If instrumentation is used there is a risk of migration, or breakage of that hardware that could require additional surgery.

## 2023-06-25 NOTE — Op Note (Signed)
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  Posterior decompression L3-4 with medial facetectomy, bilateral L4 and L3 foraminotomies. 2.  Posterior lateral arthrodesis L3-4. 3.  Instrumented posterior fusion with pedicle screw fixation L3-4  SURGEON:  Venita Lick, MD  PHYSICIAN ASSISTANT: None  ANESTHESIA:   General  EBL: 100 ml   Complications: None  Implants: NuVasive cortical pedicle screws.  5 x 5 x 40 mm length screws right L3 and L4.  5.5 x 35 mm length left L3, and 5.5 x 40 mm length screw left L4.  Cross-link was applied.  Monitoring: There was no adverse free running SSEP or EMG activity throughout the case.  All 4 pedicle screws were directly stimulated.  Left side: No activity at 39 mA.  Right side: Positive activity at 26 mA at L4 and 34 mA at L3.  All signals within the safe zone.  Graft: Autograft from decompression  BRIEF HISTORY: Mark Zamora is a 77 y.o. male who presented with severe radicular bilateral leg pain left worse than the right as well as moderate to severe back pain.  Imaging confirmed grade 2 degenerative spondylolisthesis with severe lateral recess and foraminal stenosis causing neural compression.  As a result of the failure of conservative management to improve his quality of life we elected to move forward with surgery.  Given the severity of the spinal stenosis and the need for a extensive bilateral decompression I elected to use instrumentation to prevent worsening of his underlying spondylolisthesis.  All appropriate risks, benefits, and alternatives to surgery were discussed with the patient and consent was obtained.  PROCEDURE DETAILS: Patient was brought into the operating  room and was properly positioned on the operating room table.  After induction with general anesthesia the patient was endotracheally intubated.  A timeout was taken to confirm all important data: including patient, procedure, and the level. Teds, SCD's were applied.   A Foley was placed by the nurse and the neuromonitoring representative placed all appropriate needles for intraoperative SSEP and free running EMG monitoring.  Patient was turned prone onto the Wilson frame and all bony prominences were well-padded.  The back was then prepped and draped in a standard fashion.  Using fluoroscopy I marked out my incision site.  It spanned from the midportion of the L3 pedicle down to the superior portion of the L5 pedicle.  The incision was marked out and infiltrated with quarter percent Marcaine with epinephrine.  Midline incision was made and sharp dissection was carried out down to the deep fascia.  I incised the deep fascia and stripped the paraspinal muscles bilaterally to expose the L3, L4, and a portion of the L5 spinous process.  I then exposed the L3 lamina and the entire L3-4 facet complex bilaterally.  I then exposed the inferior third of the L2-3 facet complex.  At this point I confirmed I was at the appropriate level.  The facet capsule at L3-4 was removed with Bovie to aid in postoperative fusion.  High-speed bur was placed on the inferior medial corner of the pedicle of L3.  I confirmed appropriate positioning with fluoroscopy.  I then broached the outer cortex and then placed the pedicle awl.  I then advanced the pedicle awl towards the superior lateral corner of the  pedicle.  As I advanced I used the AP film to ensure that my trajectory was correct.  As I was nearing the lateral third of the pedicle I switched to the lateral view to confirm that I was just beyond the posterior wall the vertebral body.  Having confirmed this I advanced into the vertebral body.  The awl was then removed and I palpated  the hole with a ball-tipped feeler.  As I confirmed the integrity of the hole I then placed the tap.  I then repalpated the hole to confirm the integrity.  I then placed the appropriate size screw.  I repeated this exact same technique at the other 3 pedicles.  All 4 pedicles were now properly positioned.  There is satisfactory positioning fluoroscopy and I directly stimulated them with the neuromonitoring probe.  There was no activity to suggest breach of the pedicle and irritation to the nerve root.  At this point I have removed the entire L3 spinous process and the superior third of the L4 spinous process.  Using a Kerrison rongeur performed a generous laminotomy of L3.  The laminotomy was taken just to the insertion site of the lamina on the under surface of the lamina at this point with the central pression I dissected through the ligamentum flavum until I could expose the thecal sac.  A plane was developed with the Penfield 4 between the thecal sac and the ligamentum flavum and I used my Kerrison rongeurs to remove the ligamentum flavum in its entirety.  Based on the preoperative MRI there is also significant compression on the undersurface of the superior portion of the L4 lamina.  As such I then began dissecting inferiorly until I could place a patty between the thecal sac and the vertebral body.  I then resected the leading edge of the spinous process of L4 and the lamina.  There is further decompression of the thecal sac.  I then continued to work into the lateral recess.  Great care was taken to slowly dissect and mobilize the thecal sac and nerve root.  Once I had a safe plane between the neural elements and the bone I resected the medial facet.  Very generous lateral recess resection was performed as was a medial foraminotomy at L3-L4.  I could now directly visualize the large epidural veins which were coagulated with bipolar electrocautery.  The lateral recess was now completely decompressed and I  could see the L4 nerve root as it traversed the disc space and carried itself into the L4 foramen.  The foraminotomy was adequate as was the resection of the leading edge of the L4 lamina.  At this point I went to the contralateral side and began working in the lateral recess.  Using the same technique I gently dissected with the Penfield 4 to create a plane and then resected the osteophyte to adequately decompress the lateral recess and the foramen.  I made sure I could visualize the medial borders of both L4 pedicles to confirm that my central decompression was adequate.  I again traced the L4 nerve root into the foramen.  Since the left leg was more symptomatic I did a more aggressive decompression on the side.  I took down more of the leading edge of the L4 spinous process and get a wider laminotomy.  Again the epidural veins were identified and coagulated.  At this point I can now freely pass my nerve hook superiorly out the L3 foramen and palpate the inferior aspect of  the L3 pedicle inferiorly into the lateral recess medially along the annulus and out into the L4 foramen.  An image was taken with my retractors properly positioned at the cranial and caudal margins of my decompression confirming that I had spanned the area of maximum compression seen on the preoperative MRI.  In addition I was able to reduce the spondylolisthesis following the decompression.  At this point I irrigated the wound copiously with normal saline.  Using a high-speed bur I decorticated the L3-4 facet complex and the L3 and L4 transverse processes.  I then packed the posterior lateral gutter and the facet with bone graft bilaterally.  With the posterior lateral arthrodesis in place I then attached the polyaxial heads to the pedicle screws measured and placed the appropriate size rod and locked it into place.  The central area was irrigated copiously normal saline and I confirmed again satisfactory decompression.  I then placed some  Floseal in the lateral recess and a drain which was taken out of a separate stab incision.  A cross-link was placed for added stability since I did not elect to place an interbody device.  All locking caps in the were tightened according manufacture standards.  Hemostasis was confirmed and I removed the retractors.  The deep fascia was closed with a running #1 strata fix.  This was followed by interrupted 2-0 Vicryl sutures and finally a 0 Monocryl for the skin.  A silk stitch was used to hold the drain in place.  Dry dressings were applied and the patient was ultimately extubated and transferred to the PACU without incident.  The end of the case all needle and sponge counts were correct.  There were no adverse intraoperative events.  Venita Lick, MD 06/25/2023 11:47 AM

## 2023-06-26 MED ORDER — MELATONIN 5 MG PO TABS: 20.0000 mg | ORAL_TABLET | Freq: Every day | ORAL | Status: DC

## 2023-06-26 MED ORDER — CEFAZOLIN SODIUM-DEXTROSE 1-4 GM/50ML-% IV SOLN: 1.0000 g | Freq: Three times a day (TID) | INTRAVENOUS | Status: DC

## 2023-06-26 MED ORDER — MELATONIN 5 MG PO TABS
20.0000 mg | ORAL_TABLET | Freq: Every evening | ORAL | Status: DC | PRN
  Administered 2023-06-27: 20 mg via ORAL
  Filled 2023-06-26: qty 4

## 2023-06-26 MED ORDER — CEPHALEXIN 500 MG PO CAPS
500.0000 mg | ORAL_CAPSULE | Freq: Four times a day (QID) | ORAL | Status: AC
  Administered 2023-06-26 – 2023-06-27 (×4): 500 mg via ORAL
  Filled 2023-06-26 (×4): qty 1

## 2023-06-26 MED ORDER — CEFAZOLIN (ANCEF) 1 G IV SOLR: 1.0000 g | Freq: Three times a day (TID) | INTRAVENOUS | Status: DC

## 2023-06-26 MED ORDER — CEPHALEXIN 500 MG PO CAPS
500.0000 mg | ORAL_CAPSULE | Freq: Four times a day (QID) | ORAL | Status: DC
  Filled 2023-06-26: qty 1

## 2023-06-26 NOTE — Evaluation (Signed)
Physical Therapy Evaluation Patient Details Name: Mark Zamora MRN: [id number] DOB: 14-Jan-1946 Today's Date: 06/26/2023  History of Present Illness  Pt is a 77 y.o. male s/p PLIF. 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 PTA pt lived at home alone, mod I mobility with SPC. On eval, he required min guard assist transfers, and min guard assist amb 250' with RW.  Patient will benefit from acute skilled PT to increase their independence and safety with mobility (while adhering to their precautions) to allow discharge .         If plan is discharge home, recommend the following: Assist for transportation;Help with stairs or ramp for entrance;Assistance with cooking/housework   Can travel by private vehicle        Equipment Recommendations Rolling walker (2 wheels);BSC/3in1  Recommendations for Other Services       Functional Status Assessment Patient has had a recent decline in their functional status and demonstrates the ability to make significant improvements in function in a reasonable and predictable amount of time.     Precautions / Restrictions Precautions Precautions: Back;Fall Precaution Comments: Educated on 3/3 back precautions. Handout provided. Required Braces or Orthoses: Spinal Brace Spinal Brace: Lumbar corset;Applied in sitting position      Mobility  Bed Mobility Overal bed mobility: Modified Independent             General bed mobility comments: +rail, increased time, HOB elevated    Transfers Overall transfer level: Needs assistance Equipment used: Rolling walker (2 wheels) Transfers: Sit to/from Stand Sit to Stand: Min guard           General transfer comment: increased time, cues for hand placement    Ambulation/Gait Ambulation/Gait assistance: Min guard Gait  Distance (Feet): 250 Feet Assistive device: Rolling walker (2 wheels) Gait Pattern/deviations: Step-through pattern, Decreased stride length Gait velocity: decreased Gait velocity interpretation: <1.31 ft/sec, indicative of household ambulator   General Gait Details: steady gait with RW  Stairs            Wheelchair Mobility     Tilt Bed    Modified Rankin (Stroke Patients Only)       Balance Overall balance assessment: Needs assistance Sitting-balance support: No upper extremity supported, Feet supported Sitting balance-Leahy Scale: Good     Standing balance support: Bilateral upper extremity supported, During functional activity, No upper extremity supported Standing balance-Leahy Scale: Fair Standing balance comment: static stand without UE support, RW for amb                             Pertinent Vitals/Pain Pain Assessment Pain Assessment: Faces Faces Pain Scale: Hurts even more Pain Location: back, incisional Pain Descriptors / Indicators: Operative site guarding, Discomfort, Grimacing Pain Intervention(s): Monitored during session, Repositioned    Home Living Family/patient expects to be discharged to:: Private residence Living Arrangements: Alone Available Help at Discharge: Family;Available PRN/intermittently;Neighbor Type of Home: House Home Access: Stairs to enter   [employer] of Steps: 1 at garage, 3 in front   Home Layout: One level Home Equipment: Cane - single point;Shower seat;Hand held shower head Additional Comments: has 77 y.o. Lacretia Nicks named [place]. Wife is in SNF memory care unit.    Prior Function Prior Level of Function : Independent/Modified Independent;Driving  Mobility Comments: can for amb       Hand Dominance   Dominant Hand: Right    Extremity/Trunk Assessment   Upper Extremity Assessment Upper Extremity Assessment: Defer to OT evaluation    Lower Extremity Assessment Lower  Extremity Assessment: Generalized weakness    Cervical / Trunk Assessment Cervical / Trunk Assessment: Back Surgery  Communication   Communication: No difficulties  Cognition Arousal/Alertness: Awake/alert Behavior During Therapy: WFL for tasks assessed/performed Overall Cognitive Status: Within Functional Limits for tasks assessed                                          General Comments      Exercises     Assessment/Plan    PT Assessment Patient needs continued PT services  PT Problem List Decreased balance;Pain;Decreased mobility;Decreased knowledge of use of DME;Decreased activity tolerance       PT Treatment Interventions DME instruction;Functional mobility training;Balance training;Patient/family education;Gait training;Therapeutic activities;Stair training    PT Goals (Current goals can be found in the Care Plan section)  Acute Rehab PT Goals Patient Stated Goal: home tomorrow PT Goal Formulation: With patient Time For Goal Achievement: 07/10/23 Potential to Achieve Goals: Good    Frequency Min 1X/week     Co-evaluation               AM-PAC PT "6 Clicks" Mobility  Outcome Measure Help needed turning from your back to your side while in a flat bed without using bedrails?: A Little Help needed moving from lying on your back to sitting on the side of a flat bed without using bedrails?: A Little Help needed moving to and from a bed to a chair (including a wheelchair)?: A Little Help needed standing up from a chair using your arms (e.g., wheelchair or bedside chair)?: A Little Help needed to walk in hospital room?: A Little Help needed climbing 3-5 steps with a railing? : A Lot 6 Click Score: 17    End of Session Equipment Utilized During Treatment: Gait belt;Back brace Activity Tolerance: Patient tolerated treatment well Patient left: in bed;with call bell/phone within reach Nurse Communication: Mobility status PT Visit Diagnosis:  Other abnormalities of gait and mobility (R26.89);Pain    Time: 0735-0801 PT Time Calculation (min) (ACUTE ONLY): 26 min   Charges:   PT Evaluation $PT Eval Moderate Complexity: 1 Mod PT Treatments $Gait Training: 8-22 mins PT General Charges $$ ACUTE PT VISIT: 1 Visit         Ferd Glassing., PT  Office # [phone]   Ilda Foil 06/26/2023, 9:25 AM

## 2023-06-26 NOTE — TOC Transition Note (Signed)
Transition of Care [facility]) - CM/SW Discharge Note   Patient Details  Name: JESE BENCE MRN: [id number] Date of Birth: 1946-04-28  Transition of Care [facility]) CM/SW Contact:  Kermit Balo, RN Phone Number: 06/26/2023, 11:39 AM   Clinical Narrative:    Pt will discharge home tomorrow with home health services through [facility]. Information on the AVS.  Pt's daughter to provide transport home.   Final next level of care: Home w Home Health Services Barriers to Discharge: No Barriers Identified   Patient Goals and CMS Choice CMS Medicare.gov Compare Post Acute Care list provided to:: Patient Choice offered to / list presented to : Patient  Discharge Placement                         Discharge Plan and Services Additional resources added to the After Visit Summary for                            [facility] Arranged: PT, OT [facility] Agency: [facility] Date [facility] Agency Contacted: 06/26/23   Representative spoke with at [facility] Agency: Tresa Endo  Social Determinants of Health (SDOH) Interventions SDOH Screenings   Tobacco Use: Medium Risk (06/25/2023)     Readmission Risk Interventions     No data to display

## 2023-06-26 NOTE — Evaluation (Signed)
Occupational Therapy Evaluation Patient Details Name: Mark Zamora MRN: [id number] DOB: May 26, 1946 Today's Date: 06/26/2023   History of Present Illness Pt is a 77 y.o. male s/p PLIF. 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 Currently he is setup/S-min A for basic ADLs from a RW level. He will continue to benefit from acute OT with follow up HHOT and as much A from family as possible initially (pt not considering inpatient rehab).      Recommendations for follow up therapy are one component of a multi-disciplinary discharge planning process, led by the attending physician.  Recommendations may be updated based on patient status, additional functional criteria and insurance authorization.   Assistance Recommended at Discharge Frequent or constant Supervision/Assistance  Patient can return home with the following A little help with walking and/or transfers;A little help with bathing/dressing/bathroom;Assistance with cooking/housework;Help with stairs or ramp for entrance;Assist for transportation    Functional Status Assessment  Patient has had a recent decline in their functional status and demonstrates the ability to make significant improvements in function in a reasonable and predictable amount of time.  Equipment Recommendations  Other (comment)       Precautions / Restrictions Precautions Precautions: Back;Fall Precaution Booklet Issued: Yes (comment) Precaution Comments: Educated on 3/3 back precautions. Handout provided. Required Braces or Orthoses: Spinal Brace Spinal Brace: Lumbar corset;Applied in sitting position Restrictions Weight Bearing Restrictions: No      Mobility Bed Mobility Overal bed mobility: Modified Independent              General bed mobility comments: +rail, increased time, HOB elevated    Transfers Overall transfer level: Needs assistance Equipment used: Rolling walker (2 wheels) Transfers: Sit to/from Stand Sit to Stand: Min guard           General transfer comment: increased time, cues for hand placement      Balance Overall balance assessment: Needs assistance Sitting-balance support: No upper extremity supported, Feet supported Sitting balance-Leahy Scale: Good     Standing balance support: Bilateral upper extremity supported, During functional activity, No upper extremity supported Standing balance-Leahy Scale: Fair Standing balance comment: static stand without UE support, RW for amb                           ADL either performed or assessed with clinical judgement   ADL Overall ADL's : Needs assistance/impaired Eating/Feeding: Independent;Sitting     Grooming Details (indicate cue type and reason): educated on use of 2 cups for brushing teeth to avoid bending over sink Upper Body Bathing: Sitting;Minimal assistance Upper Body Bathing Details (indicate cue type and reason): needed A for brace Lower Body Bathing: Min guard;With adaptive equipment;Sit to/from stand   Upper Body Dressing : Set up;Sitting   Lower Body Dressing: Min guard;With adaptive equipment;Sit to/from stand Lower Body Dressing Details (indicate cue type and reason): Educated and pt practiced with reacher and sock aid for LBD; needs different shoes (slips ons instead of lace ups--pt states he has some at home) Toilet Transfer: Min guard;Ambulation;Rolling walker (2 wheels)   Toileting- Clothing Manipulation and Hygiene: Min guard;Sit to/from stand Toileting - Clothing Manipulation Details (indicate cue type and reason): Educated on use of wet  wipes for back peri care             Vision Patient Visual Report: No change from baseline              Pertinent Vitals/Pain Pain  Assessment Pain Assessment: Faces Faces Pain Scale: Hurts even more Pain Location: back, incisional Pain Descriptors / Indicators: Operative site guarding, Discomfort, Grimacing Pain Intervention(s): Monitored during session, Repositioned     Hand Dominance Right   Extremity/Trunk Assessment Upper Extremity Assessment Upper Extremity Assessment: Overall WFL for tasks assessed      Communication Communication Communication: No difficulties   Cognition Arousal/Alertness: Awake/alert Behavior During Therapy: WFL for tasks assessed/performed Overall Cognitive Status: Within Functional Limits for tasks assessed                                                  Home Living Family/patient expects to be discharged to:: Private residence Living Arrangements: Alone Available Help at Discharge: Family;Available PRN/intermittently;Neighbor Type of Home: House Home Access: Stairs to enter [employer] of Steps: 1 at garage, 3 in front   Home Layout: One level     Bathroom Shower/Tub: Producer, television/film/video: Standard     Home Equipment: Cane - single point;Shower seat;Hand held shower head   Additional Comments: has 77 y.o. Lacretia Nicks named [place]. Wife is in SNF memory care unit.      Prior Functioning/Environment Prior Level of Function : Independent/Modified Independent;Driving             Mobility Comments: cane for amb          OT Problem List: Decreased range of motion;Impaired balance (sitting and/or standing);Pain      OT Treatment/Interventions: Self-care/ADL training;DME and/or AE instruction;Patient/family education    OT Goals(Current goals can be found in the care plan section) Acute Rehab OT Goals Patient Stated Goal: to go home (would not consider inpateint rehab first) OT Goal Formulation: With patient Time For Goal Achievement: 07/10/23 Potential to Achieve Goals: Good  OT Frequency: Min 1X/week        AM-PAC OT "6 Clicks" Daily Activity     Outcome Measure Help from another person eating meals?: None Help from another person taking care of personal grooming?: A Little Help from another person toileting, which includes using toliet, bedpan, or urinal?: A Little Help from another person bathing (including washing, rinsing, drying)?: A Little Help from another person to put on and taking off regular upper body clothing?: A Little Help from another person to put on and taking off regular lower body clothing?: A Little 6 Click Score: 19   End of Session Equipment Utilized During Treatment: Rolling walker (2 wheels);Back brace Nurse Communication:  (pt needs to go to bathroom at end of our session)  Activity Tolerance: Patient tolerated treatment well Patient left: in bed;with call bell/phone within reach  OT Visit Diagnosis: Unsteadiness on feet (R26.81);Other abnormalities of gait and mobility (R26.89);Muscle weakness (generalized) (M62.81);Pain Pain - part of body:  (incisional)                Time: 5621-3086 OT Time Calculation (min): 38 min Charges:  OT General Charges $OT Visit: 1 Visit OT Evaluation $OT Eval Moderate Complexity: 1 Mod OT Treatments $Self Care/Home Management : 23-37 mins  Lindon Romp OT Acute Rehabilitation Services Office [phone]  Mark Zamora 06/26/2023, 11:01 AM

## 2023-06-26 NOTE — Progress Notes (Signed)
    Subjective: Procedure(s) (LRB): POSTERIOR LUMBAR FUSION INTERBODY WITH DECOMPRESSION L3-4 (N/A) 1 Day Post-Op  Patient reports pain as 2 on 0-10 scale.  Reports decreased leg pain reports incisional back pain   Positive void Negative bowel movement Negative flatus Negative chest pain or shortness of breath  Objective: Vital signs in last 24 hours: Temp:  [97.5 F (36.4 C)-98.7 F (37.1 C)] 98.2 F (36.8 C) (07/30 0513) Pulse Rate:  [63-85] 75 (07/30 0513) Resp:  [11-20] 18 (07/30 0513) BP: (113-146)/(62-80) 113/79 (07/30 0513) SpO2:  [96 %-100 %] 96 % (07/30 0513) FiO2 (%):  [21 %] 21 % (07/29 2054)  Intake/Output from previous day: 07/29 0701 - 07/30 0700 In: 2630 [P.O.:480; I.V.:1800; IV Piggyback:350] Out: 1166 [Urine:751; Drains:315; Blood:100]  Labs: No results for input(s): "WBC", "RBC", "HCT", "PLT" in the last 72 hours. No results for input(s): "NA", "K", "CL", "CO2", "BUN", "CREATININE", "GLUCOSE", "CALCIUM" in the last 72 hours. No results for input(s): "LABPT", "INR" in the last 72 hours.  Physical Exam: Neurologically intact ABD soft Intact pulses distally Incision: dressing C/D/I Compartment soft Body mass index is 29.87 kg/m.   Assessment/Plan: Patient stable  Continue mobilization with physical therapy Continue care  Patient doing well overall. Continue drain.  Although it was excessive over the last 12 hours it has begun to decrease.  We will monitor. Will set up home health services for planned discharge tomorrow. Reevaluate patient tomorrow for discharge.  Venita Lick, MD [facility] [phone]

## 2023-06-26 NOTE — Anesthesia Postprocedure Evaluation (Signed)
Anesthesia Post Note  Patient: Mark Zamora  Procedure(s) Performed: POSTERIOR LUMBAR FUSION INTERBODY WITH DECOMPRESSION L3-4     Patient location during evaluation: PACU Anesthesia Type: General Level of consciousness: awake and alert Pain management: pain level controlled Vital Signs Assessment: post-procedure vital signs reviewed and stable Respiratory status: spontaneous breathing, nonlabored ventilation, respiratory function stable and patient connected to nasal cannula oxygen Cardiovascular status: blood pressure returned to baseline and stable Postop Assessment: no apparent nausea or vomiting Anesthetic complications: no  No notable events documented.  Last Vitals:  Vitals:   06/26/23 0805 06/26/23 0829  BP:  (!) 124/57  Pulse:  63  Resp:  16  Temp:  36.6 C  SpO2: 96% 96%    Last Pain:  Vitals:   06/26/23 1030  TempSrc:   PainSc: Asleep   Pain Goal: Patients Stated Pain Goal: 3 (06/26/23 0950)                 Jacorion Klem L Dagoberto Nealy

## 2023-06-27 ENCOUNTER — Other Ambulatory Visit: Payer: Self-pay

## 2023-06-27 LAB — CBC
HCT: 32.1 % — ABNORMAL LOW (ref 39.0–52.0)
Hemoglobin: 10.5 g/dL — ABNORMAL LOW (ref 13.0–17.0)
MCH: 29.2 pg (ref 26.0–34.0)
MCHC: 32.7 g/dL (ref 30.0–36.0)
MCV: 89.4 fL (ref 80.0–100.0)
Platelets: 147 10*3/uL — ABNORMAL LOW (ref 150–400)
RBC: 3.59 MIL/uL — ABNORMAL LOW (ref 4.22–5.81)
RDW: 14.7 % (ref 11.5–15.5)
WBC: 12.5 10*3/uL — ABNORMAL HIGH (ref 4.0–10.5)
nRBC: 0 % (ref 0.0–0.2)

## 2023-06-27 MED ORDER — TAMSULOSIN HCL 0.4 MG PO CAPS
0.4000 mg | ORAL_CAPSULE | Freq: Every day | ORAL | Status: DC
  Administered 2023-06-27 – 2023-06-29 (×3): 0.4 mg via ORAL
  Filled 2023-06-27 (×3): qty 1

## 2023-06-27 MED ORDER — FLEET ENEMA 7-19 GM/118ML RE ENEM
1.0000 | ENEMA | Freq: Once | RECTAL | Status: AC
  Administered 2023-06-27: 1 via RECTAL
  Filled 2023-06-27: qty 1

## 2023-06-27 NOTE — Progress Notes (Signed)
   06/27/23 2200  BiPAP/CPAP/SIPAP  BiPAP/CPAP/SIPAP Pt Type Adult  BiPAP/CPAP/SIPAP DREAMSTATIOND  Mask Type Nasal mask  Mask Size Medium  IPAP 12 cmH20  EPAP 8 cmH2O  FiO2 (%) 21 %  Patient Home Equipment No

## 2023-06-27 NOTE — NC FL2 (Signed)
[place] MEDICAID FL2 LEVEL OF CARE FORM     IDENTIFICATION  Patient Name: Mark Zamora Birthdate: 11/29/1945 Sex: male Admission Date (Current Location): 06/25/2023  [facility] and [place] Number:  [employer] and Address:  The [place], [address] [place], [place] [address]      Provider Number: [id number]  Attending Physician Name and Address:  Venita Lick, MD  Relative Name and Phone Number:       Current Level of Care: Hospital Recommended Level of Care: Skilled Nursing Facility Prior Approval Number:    Date Approved/Denied:   PASRR Number: [id number]  Discharge Plan: SNF    Current Diagnoses: Patient Active Problem List   Diagnosis Date Noted   S/P lumbar fusion 06/25/2023   Cerebrovascular accident (CVA) (HCC) 09/06/2022   Gait abnormality 09/06/2022   Chronic bilateral low back pain with bilateral sciatica 09/06/2022   Constipation    Acute ischemic stroke (HCC) 08/07/2022   Hyperlipidemia    OSA on CPAP 08/01/2022   GERD (gastroesophageal reflux disease) 08/01/2022   Essential hypertension 08/01/2022   Severe aortic stenosis 08/01/2022   Spinal stenosis 08/01/2022   S/P TAVR (transcatheter aortic valve replacement) 08/01/2022   COPD (chronic obstructive pulmonary disease) (HCC) 2020   OA (osteoarthritis) of hip 12/12/2017   BPH (benign prostatic hyperplasia) 06/25/2015   Acute medial meniscal tear 05/18/2015    Orientation RESPIRATION BLADDER Height & Weight     Self, Time, Situation, Place  Normal Continent Weight: 174 lb (78.9 kg) Height:  5\' 4"  (162.6 cm)  BEHAVIORAL SYMPTOMS/MOOD NEUROLOGICAL BOWEL NUTRITION STATUS      Continent Diet (regular)  AMBULATORY STATUS COMMUNICATION OF NEEDS Skin   Limited Assist Verbally Surgical wounds (closed back, honeycomb dressing)                       Personal Care Assistance Level of Assistance  Bathing, Feeding, Dressing Bathing Assistance: Limited  assistance Feeding assistance: Limited assistance Dressing Assistance: Limited assistance     Functional Limitations Info             SPECIAL CARE FACTORS FREQUENCY  PT (By licensed PT), OT (By licensed OT)     PT Frequency: 5x/wk OT Frequency: 5x/wk            Contractures Contractures Info: Not present    Additional Factors Info  Code Status, Allergies Code Status Info: Full Allergies Info: Compazine (Prochlorperazine Edisylate), Cymbalta (Duloxetine Hcl), Promethazine, Codeine, Other           Current Medications (06/27/2023):  This is the current hospital active medication list Current Facility-Administered Medications  Medication Dose Route Frequency Provider Last Rate Last Admin   0.9 %  sodium chloride infusion  250 mL Intravenous Continuous Venita Lick, MD 1 mL/hr at 06/25/23 1507 250 mL at 06/25/23 1507   acetaminophen (TYLENOL) tablet 650 mg  650 mg Oral Q4H PRN Venita Lick, MD   650 mg at 06/27/23 8469   Or   acetaminophen (TYLENOL) suppository 650 mg  650 mg Rectal Q4H PRN Venita Lick, MD       albuterol (VENTOLIN HFA) 108 (90 Base) MCG/ACT inhaler 1-2 puff  1-2 puff Inhalation Q6H PRN Venita Lick, MD       amLODipine (NORVASC) tablet 10 mg  10 mg Oral Daily Venita Lick, MD   10 mg at 06/27/23 6295   doxazosin (CARDURA) tablet 8 mg  8 mg Oral QHS Venita Lick, MD  8 mg at 06/26/23 2024   fluticasone (FLONASE) 50 MCG/ACT nasal spray 2 spray  2 spray Each Nare QHS Venita Lick, MD   2 spray at 06/26/23 2024   lactated ringers infusion   Intravenous Continuous Venita Lick, MD       lisinopril (ZESTRIL) tablet 40 mg  40 mg Oral Daily Venita Lick, MD   40 mg at 06/27/23 2956   melatonin tablet 20 mg  20 mg Oral QHS PRN Venita Lick, MD   20 mg at 06/27/23 0055   menthol-cetylpyridinium (CEPACOL) lozenge 3 mg  1 lozenge Oral PRN Venita Lick, MD       Or   phenol (CHLORASEPTIC) mouth spray 1 spray  1 spray Mouth/Throat PRN Venita Lick, MD       methocarbamol (ROBAXIN) tablet 500 mg  500 mg Oral Q6H PRN Venita Lick, MD   500 mg at 06/27/23 2130   Or   methocarbamol (ROBAXIN) 500 mg in dextrose 5 % 50 mL IVPB  500 mg Intravenous Q6H PRN Venita Lick, MD       mometasone-formoterol [facility]) 200-5 MCG/ACT inhaler 2 puff  2 puff Inhalation BID Venita Lick, MD   2 puff at 06/26/23 2024   ondansetron (ZOFRAN) tablet 4 mg  4 mg Oral Q6H PRN Venita Lick, MD       Or   ondansetron [facility]) injection 4 mg  4 mg Intravenous Q6H PRN Venita Lick, MD       oxyCODONE (Oxy IR/ROXICODONE) immediate release tablet 10 mg  10 mg Oral Q3H PRN Venita Lick, MD   10 mg at 06/27/23 8657   oxyCODONE (Oxy IR/ROXICODONE) immediate release tablet 5 mg  5 mg Oral Q3H PRN Venita Lick, MD       polyethylene glycol (MIRALAX / GLYCOLAX) packet 17 g  17 g Oral Daily PRN Venita Lick, MD       potassium chloride SA (KLOR-CON M) CR tablet 20 mEq  20 mEq Oral Daily Venita Lick, MD   20 mEq at 06/27/23 8469   rosuvastatin (CRESTOR) tablet 10 mg  10 mg Oral Daily Venita Lick, MD   10 mg at 06/27/23 6295   senna-docusate (Senokot-S) tablet 1 tablet  1 tablet Oral BID Venita Lick, MD   1 tablet at 06/27/23 2841   sodium chloride flush (NS) 0.9 % injection 3 mL  3 mL Intravenous Q12H Venita Lick, MD       sodium chloride flush (NS) 0.9 % injection 3 mL  3 mL Intravenous PRN Venita Lick, MD       sodium phosphate (FLEET) 7-19 GM/118ML enema 1 enema  1 enema Rectal Once Venita Lick, MD       spironolactone (ALDACTONE) tablet 12.5 mg  12.5 mg Oral Q48H Venita Lick, MD   12.5 mg at 06/26/23 0950   tamsulosin (FLOMAX) capsule 0.4 mg  0.4 mg Oral Daily Venita Lick, MD       zolpidem [facility]) tablet 5 mg  5 mg Oral QHS PRN Venita Lick, MD   5 mg at 06/27/23 3244     Discharge Medications: Please see discharge summary for a list of discharge medications.  Relevant Imaging Results:  Relevant Lab  Results:   Additional Information SS#: [id number]  Baldemar Lenis, LCSW

## 2023-06-27 NOTE — Progress Notes (Signed)
0540 Pt called and stated he could not get up. NT and nurse went to room and noted patient on his knees facing the bed. According to the patient, he was trying to get up and his knees gave up. Other nurse and writer assisted back him to the bed. VSS BP144/75 PR102 T98.4 95%RA. Pt denies hitting his head. No acute changes noted. Neuro intact. He stated his knees feels okay. Pt was given pain med for his incisional back pain. Reminded patient to call for help when needed. Call bell within reach and bed alarm on. Will continue to monitor patient.   1610 Daughter Jacki Cones made aware.   Will endorse to incoming shift

## 2023-06-27 NOTE — Progress Notes (Signed)
Report called to RN on 3W. Pt transferred to 3W37. All belongings were sent with the Pt. He is stable @ transfer. Rema Fendt, RN

## 2023-06-27 NOTE — Progress Notes (Signed)
    Subjective: Procedure(s) (LRB): POSTERIOR LUMBAR FUSION INTERBODY WITH DECOMPRESSION L3-4 (N/A) 2 Days Post-Op  Patient reports pain as 4 on 0-10 scale.  Reports decreased leg pain reports incisional back pain   Positive void Positive bowel movement Positive flatus Negative chest pain or shortness of breath  Objective: Vital signs in last 24 hours: Temp:  [97.9 F (36.6 C)-98.8 F (37.1 C)] 98.8 F (37.1 C) (07/31 0716) Pulse Rate:  [63-102] 96 (07/31 0716) Resp:  [16-20] 16 (07/31 0716) BP: (123-148)/(57-82) 127/70 (07/31 0716) SpO2:  [94 %-98 %] 96 % (07/31 0716) FiO2 (%):  [21 %] 21 % (07/30 2024)  Intake/Output from previous day: 07/30 0701 - 07/31 0700 In: 720 [P.O.:720] Out: 165 [Drains:165]  Labs: No results for input(s): "WBC", "RBC", "HCT", "PLT" in the last 72 hours. No results for input(s): "NA", "K", "CL", "CO2", "BUN", "CREATININE", "GLUCOSE", "CALCIUM" in the last 72 hours. No results for input(s): "LABPT", "INR" in the last 72 hours.  Physical Exam: Neurologically intact ABD soft Intact pulses distally Incision: dressing C/D/I Compartment soft Body mass index is 29.87 kg/m.   Assessment/Plan: Patient stable  Continue mobilization with physical therapy Continue care  Last shift: drain output 20cc. Patient reports a slow fall to ground this AM.  Noted leg pain and then went to the ground 5/5 motor strength on exam, ambulating, and pain in mild to moderate. Will check CBC to r/o post-op anemia If HCT is low then transfuse  Patient with positive flatus and small BM.  Will continue to encourage Possible d/c this afternoon depending upon how he does during the day.  Venita Lick, MD [facility] [phone]

## 2023-06-27 NOTE — TOC Progression Note (Signed)
Transition of Care [facility]) - Progression Note    Patient Details  Name: Mark Zamora MRN: [id number] Date of Birth: 12-Nov-1946  Transition of Care [facility]) CM/SW Contact  Baldemar Lenis, [place] Phone Number: 06/27/2023, 3:51 PM  Clinical Narrative:   CSW updated by RN that patient now being recommended for SNF. CSW met with patient to discuss recommendation, and patient in agreement, asking for [facility] in [place]. CSW sent referral to [facility], they are able to offer a bed for patient on Friday. CSW updated RN. CSW to follow.      Barriers to Discharge: English as a second language teacher, Continued Medical Work up  [facility]                                   HH Arranged: PT, OT HH Agency: [facility] Date [facility] Agency Contacted: 06/26/23   Representative spoke with at [facility] Agency: Tresa Endo   Social Determinants of Health (SDOH) Interventions SDOH Screenings   Tobacco Use: Medium Risk (06/25/2023)    Readmission Risk Interventions     No data to display

## 2023-06-27 NOTE — Progress Notes (Signed)
Occupational Therapy Treatment Patient Details Name: Mark Zamora MRN: [id number] DOB: May 20, 1946 Today's Date: 06/27/2023   History of present illness Pt is a 77 y.o. male s/p PLIF. 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 Pt needing more a for sit<>stand and maintain standing due to increased pain (and pt fell yesterday at bedside--pt reports he does not know what happened). He will continue to benefit from acute OT with follow up from continued inpatient follow up therapy, <3 hours/day that pt is now agreeable to.    Recommendations for follow up therapy are one component of a multi-disciplinary discharge planning process, led by the attending physician.  Recommendations may be updated based on patient status, additional functional criteria and insurance authorization.    Assistance Recommended at Discharge Frequent or constant Supervision/Assistance  Patient can return home with the following  A little help with walking and/or transfers;A little help with bathing/dressing/bathroom;Assistance with cooking/housework;Help with stairs or ramp for entrance;Assist for transportation   Equipment Recommendations  Other (comment) (TBD next venue)       Precautions / Restrictions Precautions Precautions: Back;Fall Precaution Comments: Reviewed 3/3 back precautions. Required Braces or Orthoses: Spinal Brace Spinal Brace: Lumbar corset;Applied in sitting position Restrictions Weight Bearing Restrictions: No       Mobility Bed Mobility Overal bed mobility: Needs Assistance Bed Mobility: Sit to Sidelying         Sit to sidelying: Min assist General bed mobility comments: needed A for legs    Transfers Overall transfer level: Needs assistance Equipment used: Rolling walker (2 wheels) Transfers: Sit to/from Stand Sit to Stand:  Min guard                 Balance Overall balance assessment: Needs assistance Sitting-balance support: No upper extremity supported, Feet supported Sitting balance-Leahy Scale: Fair     Standing balance support: Single extremity supported, During functional activity Standing balance-Leahy Scale: Poor                             ADL either performed or assessed with clinical judgement   ADL Overall ADL's : Needs assistance/impaired                     Lower Body Dressing: Minimal assistance;With adaptive equipment;Sit to/from stand Lower Body Dressing Details (indicate cue type and reason): worked with reacher to doff socks and donn underwear as well as sock aid to donn socks; pt more tired and back painful this PM Toilet Transfer: Minimal assistance;Ambulation;Rolling walker (2 wheels) Toilet Transfer Details (indicate cue type and reason): standing at toilet to urinate--increased time required                Extremity/Trunk Assessment Upper Extremity Assessment Upper Extremity Assessment: Overall WFL for tasks assessed            Vision Patient Visual Report: No change from baseline            Cognition Arousal/Alertness: Awake/alert Behavior During Therapy: WFL for tasks assessed/performed Overall Cognitive Status: Within Functional Limits for tasks assessed  Pertinent Vitals/ Pain       Pain Assessment Pain Assessment: Faces Pain Score: 6  Pain Location: back Pain Descriptors / Indicators: Discomfort, Grimacing, Operative site guarding, Sore Pain Intervention(s): Limited activity within patient's tolerance, Monitored during session, Repositioned (asked RN and not time for pain meds)         Frequency  Min 1X/week        Progress Toward Goals  OT Goals(current goals can now be found in the care plan section)  Progress towards OT goals: Not  progressing toward goals - comment (fell yesterday, pain today, and just disappointed in how he is doing)  Acute Rehab OT Goals Patient Stated Goal: agreeable to SNF now (but not what he really had in mind that he wanted to do) OT Goal Formulation: With patient Time For Goal Achievement: 07/10/23 Potential to Achieve Goals: Good  Plan Discharge plan needs to be updated       AM-PAC OT "6 Clicks" Daily Activity     Outcome Measure   Help from another person eating meals?: None Help from another person taking care of personal grooming?: A Little Help from another person toileting, which includes using toliet, bedpan, or urinal?: A Little Help from another person bathing (including washing, rinsing, drying)?: A Little Help from another person to put on and taking off regular upper body clothing?: A Little Help from another person to put on and taking off regular lower body clothing?: A Little 6 Click Score: 19    End of Session Equipment Utilized During Treatment: Rolling walker (2 wheels)  OT Visit Diagnosis: Unsteadiness on feet (R26.81);Other abnormalities of gait and mobility (R26.89);Muscle weakness (generalized) (M62.81);Pain Pain - part of body:  (incisional)   Activity Tolerance Patient limited by pain   Patient Left in bed;with call bell/phone within reach;with bed alarm set           Time: 0981-1914 OT Time Calculation (min): 14 min  Charges: OT General Charges $OT Visit: 1 Visit OT Treatments $Self Care/Home Management : 8-22 mins  Lindon Romp OT Acute Rehabilitation Services Office [phone]    Evette Georges 06/27/2023, 3:23 PM

## 2023-06-27 NOTE — Progress Notes (Signed)
Physical Therapy Treatment Patient Details Name: Mark Zamora MRN: [id number] DOB: [birth year] Today's Date: 06/27/2023   History of Present Illness Pt is a 77 y.o. male s/p PLIF. 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 He required min assist bed mobility, min guard assist transfers, and min guard assist amb 150' with RW. Overall, he is moving slower and demonstrates decreased activity tolerance. He lives alone and his daughter is only able to provide PRN assist. Pt would benefit from further therapy in inpatient setting, <3 hours/day, prior to return home.    If plan is discharge home, recommend the following: Assist for transportation;Help with stairs or ramp for entrance;Assistance with cooking/housework;A little help with walking and/or transfers;A little help with bathing/dressing/bathroom   Can travel by private vehicle     Yes  Equipment Recommendations  Rolling walker (2 wheels);BSC/3in1    Recommendations for Other Services       Precautions / Restrictions Precautions Precautions: Back;Fall Precaution Comments: Reviewed 3/3 back precautions. Required min assist to don brace. Required Braces or Orthoses: Spinal Brace Spinal Brace: Lumbar corset;Applied in sitting position Restrictions Weight Bearing Restrictions: No     Mobility  Bed Mobility Overal bed mobility: Needs Assistance Bed Mobility: Sidelying to Sit   Sidelying to sit: Min assist       General bed mobility comments: +rail, increased time, assist to scoot to EOB    Transfers Overall transfer level: Needs assistance Equipment used: Rolling walker (2 wheels) Transfers: Sit to/from Stand Sit to Stand: Min guard           General transfer comment: increased time, cues for hand placement    Ambulation/Gait Ambulation/Gait assistance: Min guard Gait Distance (Feet):  150 Feet Assistive device: Rolling walker (2 wheels) Gait Pattern/deviations: Step-through pattern, Decreased stride length, Trunk flexed Gait velocity: decreased Gait velocity interpretation: <1.8 ft/sec, indicate of risk for recurrent falls   General Gait Details: Continual cues for posture and proximity to RW. Multiple standing rest breaks due to fatigue. Pt with c/o feeling lightheaded.   Stairs             Wheelchair Mobility     Tilt Bed    Modified Rankin (Stroke Patients Only)       Balance Overall balance assessment: Needs assistance Sitting-balance support: No upper extremity supported, Feet supported Sitting balance-Leahy Scale: Fair     Standing balance support: Bilateral upper extremity supported, During functional activity, Reliant on assistive device for balance Standing balance-Leahy Scale: Poor                              Cognition Arousal/Alertness: Awake/alert Behavior During Therapy: WFL for tasks assessed/performed Overall Cognitive Status: Within Functional Limits for tasks assessed                                          Exercises      General Comments General comments (skin integrity, edema, etc.): Pt with c/o feeling lightheaded and weak. MD ordered Hgb lab.      Pertinent Vitals/Pain Pain Assessment Pain Assessment: 0-10 Pain Score: 7  Pain Location: back Pain Descriptors / Indicators: Discomfort, Grimacing, Operative site guarding, Sore Pain Intervention(s): Monitored during session, Repositioned,  Limited activity within patient's tolerance    Home Living                          Prior Function            PT Goals (current goals can now be found in the care plan section) Acute Rehab PT Goals Patient Stated Goal: home Progress towards PT goals: Not progressing toward goals - comment (increased fatigue, 7/10 pain)    Frequency    Min 1X/week      PT Plan Discharge plan  needs to be updated    Co-evaluation              AM-PAC PT "6 Clicks" Mobility   Outcome Measure  Help needed turning from your back to your side while in a flat bed without using bedrails?: A Little Help needed moving from lying on your back to sitting on the side of a flat bed without using bedrails?: A Little Help needed moving to and from a bed to a chair (including a wheelchair)?: A Little Help needed standing up from a chair using your arms (e.g., wheelchair or bedside chair)?: A Little Help needed to walk in hospital room?: A Little Help needed climbing 3-5 steps with a railing? : A Lot 6 Click Score: 17    End of Session Equipment Utilized During Treatment: Gait belt;Back brace Activity Tolerance: Patient limited by fatigue Patient left: in chair;with call bell/phone within reach Nurse Communication: Mobility status PT Visit Diagnosis: Other abnormalities of gait and mobility (R26.89);Pain     Time: 8295-6213 PT Time Calculation (min) (ACUTE ONLY): 24 min  Charges:    $Gait Training: 23-37 mins PT General Charges $$ ACUTE PT VISIT: 1 Visit                     Ferd Glassing., PT  Office # [phone]    Ilda Foil 06/27/2023, 9:18 AM

## 2023-06-28 NOTE — Care Management Important Message (Signed)
Important Message  Patient Details  Name: Mark Zamora MRN: [id number] Date of Birth: [birth year]   Medicare Important Message Given:  Yes     Dorena Bodo 06/28/2023, 2:47 PM

## 2023-06-28 NOTE — Progress Notes (Signed)
Physical Therapy Treatment Patient Details Name: Mark Zamora MRN: [id number] DOB: 1946-08-24 Today's Date: 06/28/2023   History of Present Illness Pt is a 77 y.o. male s/p PLIF 7/29. 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 Min guard throughout with increased sway noted, particularly without UE support. Reviewed precautions, recalled 1/3, reviewed brace application and alignment. Patient will continue to benefit from skilled physical therapy services to further improve independence with functional mobility.    If plan is discharge home, recommend the following: Assist for transportation;Help with stairs or ramp for entrance;Assistance with cooking/housework;A little help with walking and/or transfers;A little help with bathing/dressing/bathroom   Can travel by private vehicle     Yes  Equipment Recommendations  Rolling walker (2 wheels);BSC/3in1    Recommendations for Other Services       Precautions / Restrictions Precautions Precautions: Back;Fall Precaution Booklet Issued: Yes (comment) Precaution Comments: Reviewed back precautions. Recalled 1/3 Required Braces or Orthoses: Spinal Brace Spinal Brace: Lumbar corset;Applied in sitting position Restrictions Weight Bearing Restrictions: No     Mobility  Bed Mobility               General bed mobility comments: In recliner    Transfers Overall transfer level: Needs assistance Equipment used: Rolling walker (2 wheels) Transfers: Sit to/from Stand Sit to Stand: Min guard           General transfer comment: Min guard for safety, cues for set-up and hand placement. Slow to rise, effortful from recliner.    Ambulation/Gait Ambulation/Gait assistance: Min guard Gait Distance (Feet): 105 Feet Assistive device:  Rolling walker (2 wheels) Gait Pattern/deviations: Step-through pattern, Decreased stride length, Trunk flexed Gait velocity: decreased Gait velocity interpretation: <1.31 ft/sec, indicative of household ambulator   General Gait Details: Min guard for safety, cues for upright posture. Required 2 standing breaks to complete distance. No overt buckling. Feels a bit off balance subjectively.   Stairs             Wheelchair Mobility     Tilt Bed    Modified Rankin (Stroke Patients Only)       Balance Overall balance assessment: Needs assistance Sitting-balance support: No upper extremity supported, Feet supported Sitting balance-Leahy Scale: Fair     Standing balance support: During functional activity, No upper extremity supported Standing balance-Leahy Scale: Fair Standing balance comment: Increased sway without UE support. Able to urinate in standing with min guard assist for safety.                            Cognition Arousal/Alertness: Awake/alert Behavior During Therapy: WFL for tasks assessed/performed Overall Cognitive Status: No family/caregiver present to determine baseline cognitive functioning                                 General Comments: decreased recall of precautions, intermittent word finding difficulties, slight delay in processing        Exercises General Exercises - Lower Extremity Ankle Circles/Pumps: AROM, Both, 10 reps, Seated Gluteal Sets: Strengthening, Both, 15 reps, Seated Long Arc Quad: Strengthening, Both, 10 reps, Seated Hip ABduction/ADduction: Strengthening, Both, 15 reps, Seated Toe Raises: Strengthening, Both, 5 reps, Standing Other Exercises Other Exercises: Heel raises min  guard x5 with bil UE support.    General Comments General comments (skin integrity, edema, etc.): Reviewed precautions.      Pertinent Vitals/Pain Pain Assessment Pain Assessment: Faces Faces Pain Scale: Hurts even  more Pain Location: back and Rt knee Pain Descriptors / Indicators: Discomfort, Grimacing, Operative site guarding, Sore Pain Intervention(s): Monitored during session, Repositioned    Home Living                          Prior Function            PT Goals (current goals can now be found in the care plan section) Acute Rehab PT Goals Patient Stated Goal: home PT Goal Formulation: With patient Time For Goal Achievement: 07/10/23 Potential to Achieve Goals: Good Progress towards PT goals: Progressing toward goals    Frequency    Min 1X/week      PT Plan Current plan remains appropriate    Co-evaluation              AM-PAC PT "6 Clicks" Mobility   Outcome Measure  Help needed turning from your back to your side while in a flat bed without using bedrails?: A Little Help needed moving from lying on your back to sitting on the side of a flat bed without using bedrails?: A Little Help needed moving to and from a bed to a chair (including a wheelchair)?: A Little Help needed standing up from a chair using your arms (e.g., wheelchair or bedside chair)?: A Little Help needed to walk in hospital room?: A Little Help needed climbing 3-5 steps with a railing? : A Lot 6 Click Score: 17    End of Session Equipment Utilized During Treatment: Gait belt;Back brace Activity Tolerance: Patient tolerated treatment well Patient left: in chair;with call bell/phone within reach;with chair alarm set Nurse Communication: Mobility status PT Visit Diagnosis: Other abnormalities of gait and mobility (R26.89);Pain     Time: 5366-4403 PT Time Calculation (min) (ACUTE ONLY): 22 min  Charges:    $Therapeutic Activity: 8-22 mins PT General Charges $$ ACUTE PT VISIT: 1 Visit                     Kathlyn Sacramento, PT, DPT [facility]  Rehabilitation Services Physical Therapist Office: [phone] Website: [email or web address]    Berton Mount 06/28/2023, 12:10 PM

## 2023-06-28 NOTE — Plan of Care (Signed)

## 2023-06-28 NOTE — TOC Progression Note (Signed)
Transition of Care [facility]) - Progression Note    Patient Details  Name: Mark Zamora MRN: [id number] Date of Birth: 11-Sep-1946  Transition of Care [facility]) CM/SW Contact  Dellie Burns [place], [place] Phone Number: 06/28/2023, 10:11 AM  Clinical Narrative:   confirmed with French Ana at [facility] they have a bed available for pt tomorrow pending auth and medical clearance. CMA initiated SNF auth with [facility], reference # [id number]. Will provide updates as available.   Dellie Burns, MSW, LCSW [phone] (coverage)      Expected Discharge Plan: Skilled Nursing Facility Barriers to Discharge: Insurance Authorization, Continued Medical Work up  Expected Discharge Plan and Services                                   HH Arranged: PT, OT [facility] Agency: [facility] Date [facility] Agency Contacted: 06/26/23   Representative spoke with at [facility] Agency: Tresa Endo   Social Determinants of Health (SDOH) Interventions SDOH Screenings   Food Insecurity: No Food Insecurity (06/27/2023)  Housing: Low Risk  (06/27/2023)  Transportation Needs: No Transportation Needs (06/27/2023)  Utilities: Not At Risk (06/27/2023)  Tobacco Use: Medium Risk (06/25/2023)    Readmission Risk Interventions     No data to display

## 2023-06-28 NOTE — Progress Notes (Signed)
Subjective: 3 Days Post-Op Procedure(s) (LRB): POSTERIOR LUMBAR FUSION INTERBODY WITH DECOMPRESSION L3-4 (N/A) Patient reports pain as moderate.   Seen in AM rounds for Dr. Shon Baton Pt c/o LBP. No leg pain.  Objective: Vital signs in last 24 hours: Temp:  [98.3 F (36.8 C)-99.6 F (37.6 C)] 98.4 F (36.9 C) (08/01 0738) Pulse Rate:  [85-94] 88 (08/01 0738) Resp:  [17-18] 18 (08/01 0738) BP: (128-148)/(61-76) 129/67 (08/01 0738) SpO2:  [92 %-97 %] 94 % (08/01 0738) FiO2 (%):  [21 %] 21 % (07/31 2200)  Intake/Output from previous day: 07/31 0701 - 08/01 0700 In: 480 [P.O.:480] Out: 1500 [Urine:1500] Intake/Output this shift: Total I/O In: -  Out: 400 [Urine:400]  Recent Labs    06/27/23 0841  HGB 10.5*   Recent Labs    06/27/23 0841  WBC 12.5*  RBC 3.59*  HCT 32.1*  PLT 147*   No results for input(s): "NA", "K", "CL", "CO2", "BUN", "CREATININE", "GLUCOSE", "CALCIUM" in the last 72 hours. No results for input(s): "LABPT", "INR" in the last 72 hours.  Neurologically intact ABD soft Neurovascular intact Sensation intact distally Intact pulses distally Dorsiflexion/Plantar flexion intact Incision: dressing C/D/I and no drainage No cellulitis present Compartment soft No sign of DVT   Assessment/Plan: 3 Days Post-Op Procedure(s) (LRB): POSTERIOR LUMBAR FUSION INTERBODY WITH DECOMPRESSION L3-4 (N/A) Advance diet Up with therapy D/C IV fluids Awaiting SNF - bed at [facility] tomorrow   Dorothy Spark 06/28/2023, 11:23 AM

## 2023-06-29 DIAGNOSIS — K219 Gastro-esophageal reflux disease without esophagitis: Secondary | ICD-10-CM | POA: Diagnosis not present

## 2023-06-29 DIAGNOSIS — R2689 Other abnormalities of gait and mobility: Secondary | ICD-10-CM | POA: Diagnosis not present

## 2023-06-29 DIAGNOSIS — J309 Allergic rhinitis, unspecified: Secondary | ICD-10-CM | POA: Diagnosis not present

## 2023-06-29 DIAGNOSIS — Z7401 Bed confinement status: Secondary | ICD-10-CM | POA: Diagnosis not present

## 2023-06-29 DIAGNOSIS — I10 Essential (primary) hypertension: Secondary | ICD-10-CM | POA: Diagnosis not present

## 2023-06-29 DIAGNOSIS — Z9181 History of falling: Secondary | ICD-10-CM | POA: Diagnosis not present

## 2023-06-29 DIAGNOSIS — E785 Hyperlipidemia, unspecified: Secondary | ICD-10-CM | POA: Diagnosis not present

## 2023-06-29 DIAGNOSIS — G4733 Obstructive sleep apnea (adult) (pediatric): Secondary | ICD-10-CM | POA: Diagnosis not present

## 2023-06-29 DIAGNOSIS — I352 Nonrheumatic aortic (valve) stenosis with insufficiency: Secondary | ICD-10-CM | POA: Diagnosis not present

## 2023-06-29 DIAGNOSIS — E782 Mixed hyperlipidemia: Secondary | ICD-10-CM | POA: Diagnosis not present

## 2023-06-29 DIAGNOSIS — I714 Abdominal aortic aneurysm, without rupture, unspecified: Secondary | ICD-10-CM | POA: Diagnosis not present

## 2023-06-29 DIAGNOSIS — M48062 Spinal stenosis, lumbar region with neurogenic claudication: Secondary | ICD-10-CM | POA: Diagnosis not present

## 2023-06-29 DIAGNOSIS — M4316 Spondylolisthesis, lumbar region: Secondary | ICD-10-CM | POA: Diagnosis not present

## 2023-06-29 DIAGNOSIS — M4310 Spondylolisthesis, site unspecified: Secondary | ICD-10-CM | POA: Diagnosis not present

## 2023-06-29 DIAGNOSIS — Z0000 Encounter for general adult medical examination without abnormal findings: Secondary | ICD-10-CM | POA: Diagnosis not present

## 2023-06-29 DIAGNOSIS — Z743 Need for continuous supervision: Secondary | ICD-10-CM | POA: Diagnosis not present

## 2023-06-29 DIAGNOSIS — R0600 Dyspnea, unspecified: Secondary | ICD-10-CM | POA: Diagnosis not present

## 2023-06-29 DIAGNOSIS — M5416 Radiculopathy, lumbar region: Secondary | ICD-10-CM | POA: Diagnosis not present

## 2023-06-29 DIAGNOSIS — M4326 Fusion of spine, lumbar region: Secondary | ICD-10-CM | POA: Diagnosis not present

## 2023-06-29 DIAGNOSIS — J449 Chronic obstructive pulmonary disease, unspecified: Secondary | ICD-10-CM | POA: Diagnosis not present

## 2023-06-29 DIAGNOSIS — M6281 Muscle weakness (generalized): Secondary | ICD-10-CM | POA: Diagnosis not present

## 2023-06-29 DIAGNOSIS — G4700 Insomnia, unspecified: Secondary | ICD-10-CM | POA: Diagnosis not present

## 2023-06-29 DIAGNOSIS — I739 Peripheral vascular disease, unspecified: Secondary | ICD-10-CM | POA: Diagnosis not present

## 2023-06-29 DIAGNOSIS — R531 Weakness: Secondary | ICD-10-CM | POA: Diagnosis not present

## 2023-06-29 DIAGNOSIS — B0222 Postherpetic trigeminal neuralgia: Secondary | ICD-10-CM | POA: Diagnosis not present

## 2023-06-29 DIAGNOSIS — Z48811 Encounter for surgical aftercare following surgery on the nervous system: Secondary | ICD-10-CM | POA: Diagnosis not present

## 2023-06-29 DIAGNOSIS — M545 Low back pain, unspecified: Secondary | ICD-10-CM | POA: Diagnosis not present

## 2023-06-29 MED ORDER — MAGNESIUM CITRATE PO SOLN
0.5000 | Freq: Once | ORAL | Status: AC
  Administered 2023-06-29: 0.5 via ORAL
  Filled 2023-06-29: qty 296

## 2023-06-29 NOTE — Progress Notes (Signed)
RN called [facility] to give report. No answer. Left a voicemail.

## 2023-06-29 NOTE — Progress Notes (Signed)
Subjective: 4 Days Post-Op Procedure(s) (LRB): POSTERIOR LUMBAR FUSION INTERBODY WITH DECOMPRESSION L3-4 (N/A) Patient reports pain as moderate.   C.o back and knee pain, constipation.  Objective: Vital signs in last 24 hours: Temp:  [98 F (36.7 C)-99.3 F (37.4 C)] 98 F (36.7 C) (08/02 0750) Pulse Rate:  [69-80] 69 (08/02 0750) Resp:  [16-20] 17 (08/02 0750) BP: (125-143)/(64-75) 137/65 (08/02 0750) SpO2:  [92 %-96 %] 94 % (08/02 0836)  Intake/Output from previous day: 08/01 0701 - 08/02 0700 In: 555.9 [P.O.:480; I.V.:75.9] Out: 750 [Urine:750] Intake/Output this shift: No intake/output data recorded.  Recent Labs    06/27/23 0841  HGB 10.5*   Recent Labs    06/27/23 0841  WBC 12.5*  RBC 3.59*  HCT 32.1*  PLT 147*   No results for input(s): "NA", "K", "CL", "CO2", "BUN", "CREATININE", "GLUCOSE", "CALCIUM" in the last 72 hours. No results for input(s): "LABPT", "INR" in the last 72 hours.  Neurologically intact ABD soft Neurovascular intact Sensation intact distally Intact pulses distally Dorsiflexion/Plantar flexion intact Incision: dressing C/D/I and no drainage No cellulitis present Compartment soft No sign of DVT   Assessment/Plan: 4 Days Post-Op Procedure(s) (LRB): POSTERIOR LUMBAR FUSION INTERBODY WITH DECOMPRESSION L3-4 (N/A) Advance diet Up with therapy D/C IV fluids D/C to SNF today C/o constipation - mag citrate  Mark Zamora 06/29/2023, 11:27 AM

## 2023-06-29 NOTE — Discharge Summary (Signed)
Physician Discharge Summary   Patient ID: Mark Zamora MRN: [id number] DOB/AGE: 1946-05-04 77 y.o.  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  Admission Diagnoses:  Past Medical History:  Diagnosis Date   AAA (abdominal aortic aneurysm) (HCC) 01/29/2017   2.9cm   Allergic rhinitis    Ambulates with cane    Three prong cane   Anxiety    Arthritis    l ankle   BPH (benign prostatic hypertrophy)    Cancer (HCC)    HX SKIN CANCER - right/back of shoulder   Cataract, bilateral    Chronic back pain    Chronic left hip pain    Constipation    COPD (chronic obstructive pulmonary disease) (HCC) 2020   Depression    Dyspnea    with exertion   Essential hypertension    GERD (gastroesophageal reflux disease)    History of chronic sinusitis    History of skin cancer    Hyperlipidemia    Insomnia    Neck pain, chronic    Nocturia    OSA on CPAP    uses cpap nightly   Pedal edema    Chronic   Postherpetic neuralgia    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 Lynnette Caffey & Dr. Leafy Ro   Severe aortic stenosis    Shingles    Spinal stenosis    Discharge Diagnoses:   Principal Problem:   S/P lumbar fusion  Procedure:  Procedure(s) (LRB): POSTERIOR LUMBAR FUSION INTERBODY WITH DECOMPRESSION L3-4 (N/A)   Consults: None  HPI:  See H&P    Laboratory Data: Hospital Outpatient Visit on 06/14/2023  Component Date Value Ref Range Status   Sodium 06/14/2023 130 (L)  135 - 145 mmol/L Final   Potassium 06/14/2023 3.4 (L)  3.5 - 5.1 mmol/L Final   Chloride 06/14/2023 98  98 - 111 mmol/L Final   CO2 06/14/2023 23  22 - 32 mmol/L Final   Glucose, Bld 06/14/2023 92  70 - 99 mg/dL Final   Glucose reference range applies only to samples taken after fasting for at least 8 hours.   BUN 06/14/2023 10  8 - 23 mg/dL  Final   Creatinine, Ser 06/14/2023 0.71  0.61 - 1.24 mg/dL Final   Calcium 78/29/5621 9.3  8.9 - 10.3 mg/dL Final   GFR, Estimated 06/14/2023 >60  >60 mL/min Final   Comment: (NOTE) Calculated using the CKD-EPI Creatinine Equation (2021)    Anion gap 06/14/2023 9  5 - 15 Final   Performed at [facility], [address] [place], [place] [address]   MRSA, PCR 06/14/2023 POSITIVE (A)  NEGATIVE Final   Comment: RESULT CALLED TO, READ BACK BY AND VERIFIED WITH: EMAILED Rush Landmark [phone] @ [phone] FH    Staphylococcus aureus 06/14/2023 POSITIVE (A)  NEGATIVE Final   Comment: (NOTE) The Xpert SA Assay (FDA approved for NASAL specimens in patients 55 years of age and older), is one component of a comprehensive surveillance program. It is not intended to diagnose infection nor to guide or monitor treatment. Performed at [facility], [address] [place], [place] [address]    WBC 06/14/2023 7.9  4.0 - 10.5 K/uL Final   RBC 06/14/2023 4.60  4.22 - 5.81 MIL/uL Final   Hemoglobin 06/14/2023 13.7  13.0 - 17.0 g/dL Final  HCT 06/14/2023 41.4  39.0 - 52.0 % Final   MCV 06/14/2023 90.0  80.0 - 100.0 fL Final   MCH 06/14/2023 29.8  26.0 - 34.0 pg Final   MCHC 06/14/2023 33.1  30.0 - 36.0 g/dL Final   RDW 95/62/1308 14.1  11.5 - 15.5 % Final   Platelets 06/14/2023 178  150 - 400 K/uL Final   nRBC 06/14/2023 0.0  0.0 - 0.2 % Final   Performed at [facility], [address], [place], [place] [address]   ABO/RH(D) 06/14/2023 A NEG   Final   Antibody Screen 06/14/2023 NEG   Final   Sample Expiration 06/14/2023 06/28/2023,2359   Final   Extend sample reason 06/14/2023    Final                   Value:NO TRANSFUSIONS OR PREGNANCY IN THE PAST 3 MONTHS Performed at [facility], [address], [place], [place] [address]    Recent Labs    06/27/23 0841  HGB 10.5*   Recent Labs    06/27/23 0841  WBC 12.5*  RBC 3.59*  HCT 32.1*  PLT 147*   No results for input(s):  "NA", "K", "CL", "CO2", "BUN", "CREATININE", "GLUCOSE", "CALCIUM" in the last 72 hours. No results for input(s): "LABPT", "INR" in the last 72 hours.  X-Rays:DG Lumbar Spine 2-3 Views  Result Date: 06/25/2023 CLINICAL DATA:  L3-4 fusion EXAM: LUMBAR SPINE - 2-3 VIEW COMPARISON:  None Available. FINDINGS: Three fluoroscopic images are obtained during the performance of the procedure and are provided for interpretation only. Images demonstrate discectomy and posterior fusion hardware at the L3-4 level. Alignment is grossly anatomic. Please refer to the operative report. Fluoroscopy time: 1 minute 49 seconds, 84.35 mGy IMPRESSION: 1. Intraoperative evaluation as above. Electronically Signed   By: Sharlet Salina M.D.   On: 06/25/2023 15:35   DG C-Arm 1-60 Min-No Report  Result Date: 06/25/2023 Fluoroscopy was utilized by the requesting physician.  No radiographic interpretation.   DG C-Arm 1-60 Min-No Report  Result Date: 06/25/2023 Fluoroscopy was utilized by the requesting physician.  No radiographic interpretation.   DG C-Arm 1-60 Min-No Report  Result Date: 06/25/2023 Fluoroscopy was utilized by the requesting physician.  No radiographic interpretation.   DG C-Arm 1-60 Min-No Report  Result Date: 06/25/2023 Fluoroscopy was utilized by the requesting physician.  No radiographic interpretation.    EKG: Orders placed or performed in visit on 02/13/23   EKG 12-Lead     Hospital Course: Patient was admitted to [facility] and taken to the OR and underwent the above state procedure without complications.  Patient tolerated the procedure well and was later transferred to the recovery room and then to the orthopaedic floor for postoperative care.  They were given PO and IV analgesics for pain control following their surgery.  They were given 24 hours of postoperative antibiotics.   PT was consulted postop to assist with mobility and transfers.  The patient was allowed to be WBAT with  therapy and was taught back precautions. Discharge planning was consulted to help with postop disposition and equipment needs.  Patient had a fair night on the evening of surgery and started to get up OOB with therapy on day one. Patient was seen in rounds daily and was ready to go home on day four.  They were given discharge instructions and dressing directions.  They were instructed on when to follow up in the office with Dr.  Brooks.   Diet: Regular diet Activity:WBAT, brace, lumbar precautions Follow-up:in 10-14 days Disposition - Home Discharged Condition: good   Discharge Instructions     Call MD / Call 911   Complete by: As directed    If you experience chest pain or shortness of breath, CALL 911 and be transported to the hospital emergency room.  If you develope a fever above 101 F, pus (white drainage) or increased drainage or redness at the wound, or calf pain, call your surgeon's office.   Constipation Prevention   Complete by: As directed    Drink plenty of fluids.  Prune juice may be helpful.  You may use a stool softener, such as Colace (over the counter) 100 mg twice a day.  Use MiraLax (over the counter) for constipation as needed.   Diet - low sodium heart healthy   Complete by: As directed    Incentive spirometry RT   Complete by: As directed    Increase activity slowly as tolerated   Complete by: As directed    Post-operative opioid taper instructions:   Complete by: As directed    POST-OPERATIVE OPIOID TAPER INSTRUCTIONS: It is important to wean off of your opioid medication as soon as possible. If you do not need pain medication after your surgery it is ok to stop day one. Opioids include: Codeine, Hydrocodone(Norco, Vicodin), Oxycodone(Percocet, oxycontin) and hydromorphone amongst others.  Long term and even short term use of opiods can cause: Increased pain response Dependence Constipation Depression Respiratory depression And more.  Withdrawal symptoms can  include Flu like symptoms Nausea, vomiting And more Techniques to manage these symptoms Hydrate well Eat regular healthy meals Stay active Use relaxation techniques(deep breathing, meditating, yoga) Do Not substitute Alcohol to help with tapering If you have been on opioids for less than two weeks and do not have pain than it is ok to stop all together.  Plan to wean off of opioids This plan should start within one week post op of your joint replacement. Maintain the same interval or time between taking each dose and first decrease the dose.  Cut the total daily intake of opioids by one tablet each day Next start to increase the time between doses. The last dose that should be eliminated is the evening dose.         Allergies as of 06/29/2023       Reactions   Compazine [prochlorperazine Edisylate] Anaphylaxis, Other (See Comments)   Pt states "any nausea med ending in "zine"  Seizures   Cymbalta [duloxetine Hcl] Other (See Comments)   Urinary retention    Promethazine Anaphylaxis, Other (See Comments)   seizures   Codeine Other (See Comments)   constipation   Other Other (See Comments)   All antiemetic meds ending -zine  Prefers to not take any narcotic pain meds        Medication List     STOP taking these medications    amoxicillin 500 MG tablet Commonly known as: AMOXIL   diclofenac 75 MG EC tablet Commonly known as: VOLTAREN   NON FORMULARY   tiZANidine 4 MG capsule Commonly known as: ZANAFLEX   traMADol 50 MG tablet Commonly known as: ULTRAM       TAKE these medications    acetaminophen 650 MG CR tablet Commonly known as: TYLENOL Take 1,300 mg by mouth every 8 (eight) hours as needed for pain.   albuterol 108 (90 Base) MCG/ACT inhaler Commonly known as: VENTOLIN HFA Inhale 1-2 puffs  into the lungs every 6 (six) hours as needed for shortness of breath or wheezing.   amLODipine 10 MG tablet Commonly known as: NORVASC Take 10 mg by mouth  daily.   antiseptic oral rinse Liqd 15 mLs by Mouth Rinse route as needed for dry mouth.   aspirin EC 81 MG tablet Take 1 tablet (81 mg total) by mouth daily. Swallow whole.   budesonide-formoterol 160-4.5 MCG/ACT inhaler Commonly known as: SYMBICORT Inhale 2 puffs into the lungs 2 (two) times daily.   doxazosin 8 MG tablet Commonly known as: CARDURA Take 8 mg by mouth at bedtime.   famotidine 40 MG tablet Commonly known as: PEPCID Take 40 mg by mouth every evening.   fluticasone 50 MCG/ACT nasal spray Commonly known as: FLONASE Place 2 sprays into both nostrils at bedtime.   lisinopril 40 MG tablet Commonly known as: ZESTRIL Take 1 tablet (40 mg total) by mouth daily.   Melatonin 10 MG Tabs Take 20 mg by mouth at bedtime.   Metamucil 28.3 % Powd Generic drug: Psyllium Take 1 Scoop by mouth daily.   methocarbamol 500 MG tablet Commonly known as: ROBAXIN Take 1 tablet (500 mg total) by mouth every 8 (eight) hours as needed for up to 5 days for muscle spasms.   ondansetron 4 MG tablet Commonly known as: Zofran Take 1 tablet (4 mg total) by mouth every 8 (eight) hours as needed for nausea or vomiting.   oxyCODONE-acetaminophen 10-325 MG tablet Commonly known as: Percocet Take 1 tablet by mouth every 6 (six) hours as needed for up to 5 days for pain.   pantoprazole 40 MG tablet Commonly known as: PROTONIX TAKE ONE TABLET BY MOUTH ONE TIME DAILY   potassium chloride SA 20 MEQ tablet Commonly known as: KLOR-CON M Take 20 mEq by mouth daily.   PRESERVISION AREDS 2 PO Take 1 tablet by mouth 2 (two) times daily with a meal.   rosuvastatin 10 MG tablet Commonly known as: CRESTOR TAKE ONE TABLET BY MOUTH ONE TIME DAILY   spironolactone 25 MG tablet Commonly known as: ALDACTONE Take 0.5 tablets (12.5 mg total) by mouth daily. Take 6.25 mg by mouth daily. What changed:  how much to take additional instructions   Vitamin D3 50 MCG (2000 UT) capsule Take 2,000  Units by mouth daily.   vitamin E 180 MG (400 UNITS) capsule Take 400 Units by mouth daily.   zolpidem 12.5 MG CR tablet Commonly known as: AMBIEN CR Take 1 tablet by mouth once daily at bedtime as needed What changed:  how much to take when to take this        Follow-up Information     Dorothy Spark, PA-C. Schedule an appointment as soon as possible for a visit in 2 week(s).   Specialty: Orthopedic Surgery Why: If symptoms worsen, For suture removal, For wound re-check Contact information: [address] [place] [address] [phone]         [facility] Follow up.   Specialty: Home Health Services Why: The home health agency will contact you for the first home visit Contact information: [address] [place] [address] [phone]                 Signed: Andrez Grime, PA-C Orthopaedic Surgery 06/29/2023, 11:30 AM

## 2023-06-29 NOTE — TOC Transition Note (Signed)
Transition of Care [facility]) - CM/SW Discharge Note   Patient Details  Name: Mark Zamora MRN: [id number] Date of Birth: May 05, 1946  Transition of Care [facility]) CM/SW Contact:  Deatra Robinson, [place] Phone Number: 06/29/2023, 12:19 PM   Clinical Narrative:   pt for dc to [facility] today. Auth received [id number], next review 8/6) and details provided to [facility] who confirmed they are prepared to admit pt to room 229. Pt aware of dc and reports agreeable. RN provided with number for report and PTAR arranged for transport. SW signing off at dc.   Dellie Burns, MSW, LCSW [phone] (coverage)      Final next level of care: Skilled Nursing Facility Barriers to Discharge: No Barriers Identified   Patient Goals and CMS Choice CMS Medicare.gov Compare Post Acute Care list provided to:: Patient Choice offered to / list presented to : Patient  Discharge Placement                Patient chooses bed at: [facility] [place] Patient to be transferred to facility by: PT[facility] Name of family member notified: pt to update family Patient and family notified of of transfer: 06/29/23  Discharge Plan and Services Additional resources added to the After Visit Summary for                            [facility] Arranged: PT, OT [facility] Agency: [facility] Date [facility] Agency Contacted: 06/26/23   Representative spoke with at [facility] Agency: Tresa Endo  Social Determinants of Health (SDOH) Interventions SDOH Screenings   Food Insecurity: No Food Insecurity (06/27/2023)  Housing: Low Risk  (06/27/2023)  Transportation Needs: No Transportation Needs (06/27/2023)  Utilities: Not At Risk (06/27/2023)  Tobacco Use: Medium Risk (06/25/2023)     Readmission Risk Interventions     No data to display

## 2023-07-03 DIAGNOSIS — G4733 Obstructive sleep apnea (adult) (pediatric): Secondary | ICD-10-CM | POA: Diagnosis not present

## 2023-07-09 DIAGNOSIS — M5416 Radiculopathy, lumbar region: Secondary | ICD-10-CM | POA: Diagnosis not present

## 2023-07-09 DIAGNOSIS — M4310 Spondylolisthesis, site unspecified: Secondary | ICD-10-CM | POA: Diagnosis not present

## 2023-07-09 DIAGNOSIS — M48062 Spinal stenosis, lumbar region with neurogenic claudication: Secondary | ICD-10-CM | POA: Diagnosis not present

## 2023-07-10 DIAGNOSIS — Z0000 Encounter for general adult medical examination without abnormal findings: Secondary | ICD-10-CM | POA: Diagnosis not present

## 2023-07-10 DIAGNOSIS — G4700 Insomnia, unspecified: Secondary | ICD-10-CM | POA: Diagnosis not present

## 2023-07-10 DIAGNOSIS — I714 Abdominal aortic aneurysm, without rupture, unspecified: Secondary | ICD-10-CM | POA: Diagnosis not present

## 2023-07-10 DIAGNOSIS — J449 Chronic obstructive pulmonary disease, unspecified: Secondary | ICD-10-CM | POA: Diagnosis not present

## 2023-07-10 DIAGNOSIS — Z9181 History of falling: Secondary | ICD-10-CM | POA: Diagnosis not present

## 2023-07-10 DIAGNOSIS — K219 Gastro-esophageal reflux disease without esophagitis: Secondary | ICD-10-CM | POA: Diagnosis not present

## 2023-07-10 DIAGNOSIS — G4733 Obstructive sleep apnea (adult) (pediatric): Secondary | ICD-10-CM | POA: Diagnosis not present

## 2023-07-10 DIAGNOSIS — E782 Mixed hyperlipidemia: Secondary | ICD-10-CM | POA: Diagnosis not present

## 2023-07-12 DIAGNOSIS — M48062 Spinal stenosis, lumbar region with neurogenic claudication: Secondary | ICD-10-CM | POA: Diagnosis not present

## 2023-07-12 DIAGNOSIS — I10 Essential (primary) hypertension: Secondary | ICD-10-CM | POA: Diagnosis not present

## 2023-07-12 DIAGNOSIS — M4316 Spondylolisthesis, lumbar region: Secondary | ICD-10-CM | POA: Diagnosis not present

## 2023-07-12 DIAGNOSIS — J449 Chronic obstructive pulmonary disease, unspecified: Secondary | ICD-10-CM | POA: Diagnosis not present

## 2023-07-12 DIAGNOSIS — Z4789 Encounter for other orthopedic aftercare: Secondary | ICD-10-CM | POA: Diagnosis not present

## 2023-07-16 DIAGNOSIS — M48062 Spinal stenosis, lumbar region with neurogenic claudication: Secondary | ICD-10-CM | POA: Diagnosis not present

## 2023-07-18 DIAGNOSIS — I10 Essential (primary) hypertension: Secondary | ICD-10-CM | POA: Diagnosis not present

## 2023-07-18 DIAGNOSIS — Z4789 Encounter for other orthopedic aftercare: Secondary | ICD-10-CM | POA: Diagnosis not present

## 2023-07-18 DIAGNOSIS — J449 Chronic obstructive pulmonary disease, unspecified: Secondary | ICD-10-CM | POA: Diagnosis not present

## 2023-07-18 DIAGNOSIS — M4316 Spondylolisthesis, lumbar region: Secondary | ICD-10-CM | POA: Diagnosis not present

## 2023-07-18 DIAGNOSIS — M48062 Spinal stenosis, lumbar region with neurogenic claudication: Secondary | ICD-10-CM | POA: Diagnosis not present

## 2023-07-20 DIAGNOSIS — J449 Chronic obstructive pulmonary disease, unspecified: Secondary | ICD-10-CM | POA: Diagnosis not present

## 2023-07-20 DIAGNOSIS — Z4789 Encounter for other orthopedic aftercare: Secondary | ICD-10-CM | POA: Diagnosis not present

## 2023-07-20 DIAGNOSIS — M4316 Spondylolisthesis, lumbar region: Secondary | ICD-10-CM | POA: Diagnosis not present

## 2023-07-20 DIAGNOSIS — M48062 Spinal stenosis, lumbar region with neurogenic claudication: Secondary | ICD-10-CM | POA: Diagnosis not present

## 2023-07-20 DIAGNOSIS — I10 Essential (primary) hypertension: Secondary | ICD-10-CM | POA: Diagnosis not present

## 2023-07-24 DIAGNOSIS — J449 Chronic obstructive pulmonary disease, unspecified: Secondary | ICD-10-CM | POA: Diagnosis not present

## 2023-07-24 DIAGNOSIS — Z4789 Encounter for other orthopedic aftercare: Secondary | ICD-10-CM | POA: Diagnosis not present

## 2023-07-24 DIAGNOSIS — M4316 Spondylolisthesis, lumbar region: Secondary | ICD-10-CM | POA: Diagnosis not present

## 2023-07-24 DIAGNOSIS — I10 Essential (primary) hypertension: Secondary | ICD-10-CM | POA: Diagnosis not present

## 2023-07-24 DIAGNOSIS — M48062 Spinal stenosis, lumbar region with neurogenic claudication: Secondary | ICD-10-CM | POA: Diagnosis not present

## 2023-08-02 DIAGNOSIS — J449 Chronic obstructive pulmonary disease, unspecified: Secondary | ICD-10-CM | POA: Diagnosis not present

## 2023-08-02 DIAGNOSIS — I10 Essential (primary) hypertension: Secondary | ICD-10-CM | POA: Diagnosis not present

## 2023-08-02 DIAGNOSIS — M4316 Spondylolisthesis, lumbar region: Secondary | ICD-10-CM | POA: Diagnosis not present

## 2023-08-02 DIAGNOSIS — M48062 Spinal stenosis, lumbar region with neurogenic claudication: Secondary | ICD-10-CM | POA: Diagnosis not present

## 2023-08-02 DIAGNOSIS — Z4789 Encounter for other orthopedic aftercare: Secondary | ICD-10-CM | POA: Diagnosis not present

## 2023-08-07 DIAGNOSIS — Z4889 Encounter for other specified surgical aftercare: Secondary | ICD-10-CM | POA: Diagnosis not present

## 2023-08-07 DIAGNOSIS — M545 Low back pain, unspecified: Secondary | ICD-10-CM | POA: Diagnosis not present

## 2023-08-08 ENCOUNTER — Ambulatory Visit (HOSPITAL_COMMUNITY): Payer: Medicare Other | Attending: Cardiology

## 2023-08-08 ENCOUNTER — Ambulatory Visit: Payer: Medicare Other | Admitting: Cardiology

## 2023-08-08 VITALS — BP 140/62 | HR 49 | Ht 63.5 in | Wt 172.0 lb

## 2023-08-08 DIAGNOSIS — I10 Essential (primary) hypertension: Secondary | ICD-10-CM | POA: Insufficient documentation

## 2023-08-08 DIAGNOSIS — Z952 Presence of prosthetic heart valve: Secondary | ICD-10-CM

## 2023-08-08 DIAGNOSIS — I350 Nonrheumatic aortic (valve) stenosis: Secondary | ICD-10-CM | POA: Diagnosis not present

## 2023-08-08 DIAGNOSIS — I639 Cerebral infarction, unspecified: Secondary | ICD-10-CM | POA: Insufficient documentation

## 2023-08-08 LAB — ECHOCARDIOGRAM COMPLETE
AR max vel: 2.19 cm2
AV Area VTI: 2.04 cm2
AV Area mean vel: 2.06 cm2
AV Mean grad: 11 mmHg
AV Peak grad: 21 mmHg
Ao pk vel: 2.29 m/s
Area-P 1/2: 2.76 cm2
S' Lateral: 2.9 cm

## 2023-08-08 NOTE — Patient Instructions (Signed)
Medication Instructions:  Your physician recommends that you continue on your current medications as directed. Please refer to the Current Medication list given to you today.  *If you need a refill on your cardiac medications before your next appointment, please call your pharmacy*  Lab Work: If you have labs (blood work) drawn today and your tests are completely normal, you will receive your results only by: MyChart Message (if you have MyChart) OR A paper copy in the mail If you have any lab test that is abnormal or we need to change your treatment, we will call you to review the results.  Testing/Procedures: None ordered today.  Follow-Up: At [facility], you and your health needs are our priority.  As part of our continuing mission to provide you with exceptional heart care, we have created designated Provider Care Teams.  These Care Teams include your primary Cardiologist (physician) and Advanced Practice Providers (APPs -  Physician Assistants and Nurse Practitioners) who all work together to provide you with the care you need, when you need it.  We recommend signing up for the patient portal called "MyChart".  Sign up information is provided on this After Visit Summary.  MyChart is used to connect with patients for Virtual Visits (Telemedicine).  Patients are able to view lab/test results, encounter notes, upcoming appointments, etc.  Non-urgent messages can be sent to your provider as well.   To learn more about what you can do with MyChart, go to [email or web address].    Your next appointment:   6 month(s)  Provider:   Orbie Pyo, MD

## 2023-08-08 NOTE — Progress Notes (Signed)
HEART AND VASCULAR CENTER   MULTIDISCIPLINARY HEART VALVE TEAM  Structural Heart Office Note:  .   Date:  08/08/2023  ID:  Mark Zamora, DOB May 31, 1946, MRN [id number] PCP: Aliene Beams, MD  [facility] Providers Cardiologist:  Orbie Pyo, MD  }   History of Present Illness: .    Mark Zamora is a 77 y.o. male with a hx of COPD, spinal stenosis, AAA, HTN, OSA on CPAP, recent CVA, and severe aortic stenosis s/p TAVR 08/01/22 and is being seen today for 1 year follow up.    Mark Zamora was initially referred to the structural heart team with a 6 month hx of increasing shortness of breath with activities. Echocardiogram from 05/17/2022 showed EF 55-60%, severe AS with mean grad 38 mmHg, peak grad 59.9 mmHg, AVA 0.62cm2, DVI 0.20, SVI 35. L/RHC 05/31/2022 showed mild nonobstructive coronary disease. He was evaluated by the multidisciplinary valve team and underwent successful TAVR with a 29 mm Edwards Sapien 3 Ultra Resilia THV via the TF approach on 08/01/22 by Dr. Lynnette Caffey and Dr. Laneta Simmers. Post operative echo showed EF 55%, normally functioning TAVR with a mean gradient of 7 mmHg and no PVL. He was discharged on ASA 81mg  QD.     Unfortunately he was readmitted 08/06/22 with sudden unilateral vision changes with head CT showing cerebellar hypodensities suggestive of infarctions. 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   He was seen in follow up with myself and subsequently with Dr. Lynnette Caffey and continued to do very well. He was initially placed on DAPT then high dose ASA. This was reduced to 81mg  at last structural heart follow up. Today he is here alone and reports he recently  underwent back surgery with spinal fusion and has also done remarkable well with this at 6 weeks post op. He is walking daily 1-2 miles with no pain or SOB. He denies chest pain, SOB, palpitations, LE edema, orthopnea, PND, dizziness, or syncope. Denies bleeding in stool or urine.  Physical Exam:    VS:  BP (!) 140/62 (BP Location: Left Arm, Patient Position: Sitting, Cuff Size: Normal)   Pulse (!) 49   Ht 5' 3.5" (1.613 m)   Wt 172 lb (78 kg)   SpO2 99%   BMI 29.99 kg/m    Wt Readings from Last 3 Encounters:  08/08/23 172 lb (78 kg)  06/25/23 174 lb (78.9 kg)  06/14/23 175 lb (79.4 kg)    General: Well developed, well nourished, NAD Lungs:Clear to ausculation bilaterally. No wheezes, rales, or rhonchi. Breathing is unlabored. Cardiovascular: RRR with S1 S2. No murmurs Extremities: No edema.  Neuro: Alert and oriented. No focal deficits. No facial asymmetry. MAE spontaneously. Psych: Responds to questions appropriately with normal affect.    ASSESSMENT AND PLAN: .    Severe AS: Pt doing very well with NYHA class I symptoms s/p successful TAVR with a 29 mm Edwards Sapien 3 Ultra Resilia THV via the TF approach on 08/01/22. Echo today with EF at 60-65% with stable valve function with 29mm S3UR valve with a mean gradient at , AVA by VTI 2.04cm2, and DI at 0.46. Continue lifelong ASA 81mg  daily. Needs lifelong dental SBE with Amoxicillin. Plan regular follow up with Dr.  Thukkani in 4-6 months.    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 Continue ASA 81mg . No new neuro symptoms.      HTN: Stable with no changes today.   Recent back surgery: reports severe spinal stenosis and underwent decompression with fusion 06/25/23. Doing great with no pain with ambulation.    Signed, Georgie Chard, NP

## 2023-08-10 DIAGNOSIS — M545 Low back pain, unspecified: Secondary | ICD-10-CM | POA: Diagnosis not present

## 2023-08-13 DIAGNOSIS — M545 Low back pain, unspecified: Secondary | ICD-10-CM | POA: Diagnosis not present

## 2023-08-15 DIAGNOSIS — G4733 Obstructive sleep apnea (adult) (pediatric): Secondary | ICD-10-CM | POA: Diagnosis not present

## 2023-08-20 DIAGNOSIS — M545 Low back pain, unspecified: Secondary | ICD-10-CM | POA: Diagnosis not present

## 2023-08-24 DIAGNOSIS — M545 Low back pain, unspecified: Secondary | ICD-10-CM | POA: Diagnosis not present

## 2023-08-26 DIAGNOSIS — R300 Dysuria: Secondary | ICD-10-CM | POA: Diagnosis not present

## 2023-08-31 DIAGNOSIS — M545 Low back pain, unspecified: Secondary | ICD-10-CM | POA: Diagnosis not present

## 2023-09-03 DIAGNOSIS — M545 Low back pain, unspecified: Secondary | ICD-10-CM | POA: Diagnosis not present

## 2023-09-07 DIAGNOSIS — M545 Low back pain, unspecified: Secondary | ICD-10-CM | POA: Diagnosis not present

## 2023-09-10 DIAGNOSIS — M545 Low back pain, unspecified: Secondary | ICD-10-CM | POA: Diagnosis not present

## 2023-09-14 DIAGNOSIS — M545 Low back pain, unspecified: Secondary | ICD-10-CM | POA: Diagnosis not present

## 2023-09-18 DIAGNOSIS — Z4889 Encounter for other specified surgical aftercare: Secondary | ICD-10-CM | POA: Diagnosis not present

## 2023-10-10 DIAGNOSIS — E782 Mixed hyperlipidemia: Secondary | ICD-10-CM | POA: Diagnosis not present

## 2023-10-10 DIAGNOSIS — R8290 Unspecified abnormal findings in urine: Secondary | ICD-10-CM | POA: Diagnosis not present

## 2023-10-10 DIAGNOSIS — I10 Essential (primary) hypertension: Secondary | ICD-10-CM | POA: Diagnosis not present

## 2023-10-10 DIAGNOSIS — G4700 Insomnia, unspecified: Secondary | ICD-10-CM | POA: Diagnosis not present

## 2023-10-17 IMAGING — US US AORTA
1 series · 14 of 25 positions shown · non-contrast
Comparison: 01/29/2017

CLINICAL DATA: Ectatic abdominal aorta

EXAM:
ULTRASOUND OF ABDOMINAL AORTA
TECHNIQUE: Ultrasound examination of the abdominal aorta and proximal common
iliac arteries was performed to evaluate for aneurysm. Additional
color and Doppler images of the distal aorta were obtained to
document patency.

[Series 1: us aorta · 0.20mm/px · 14 of 33 slices shown]
[im 1/33]
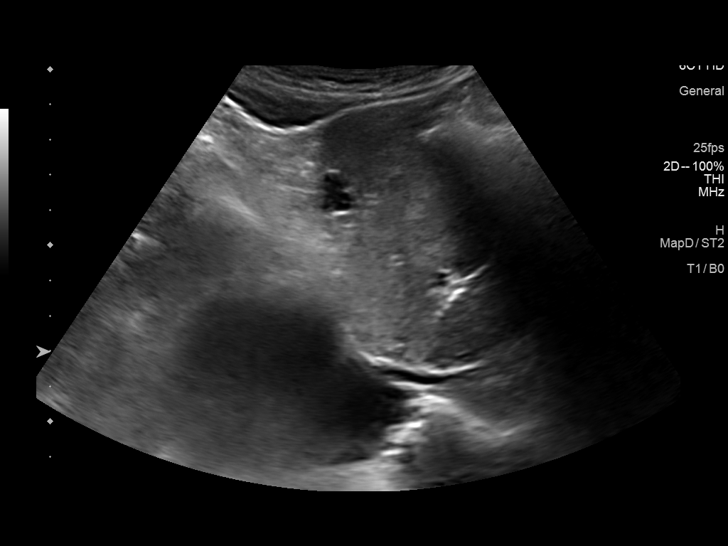
[im 3/33]
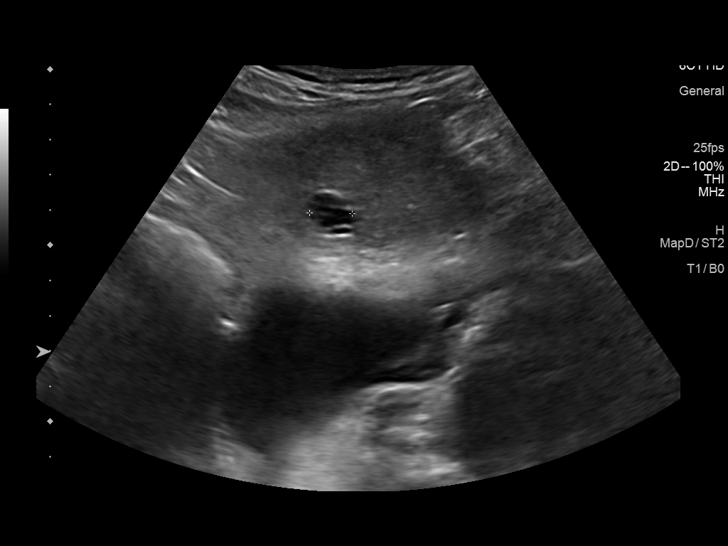
[im 6/33]
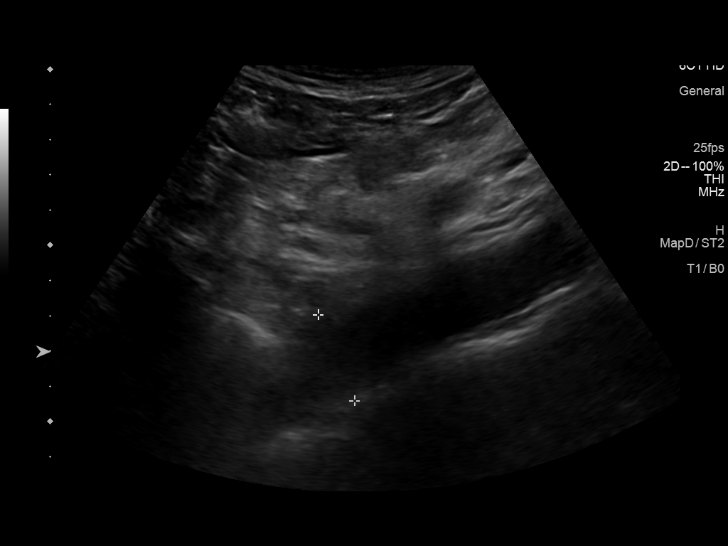
[im 9/33]
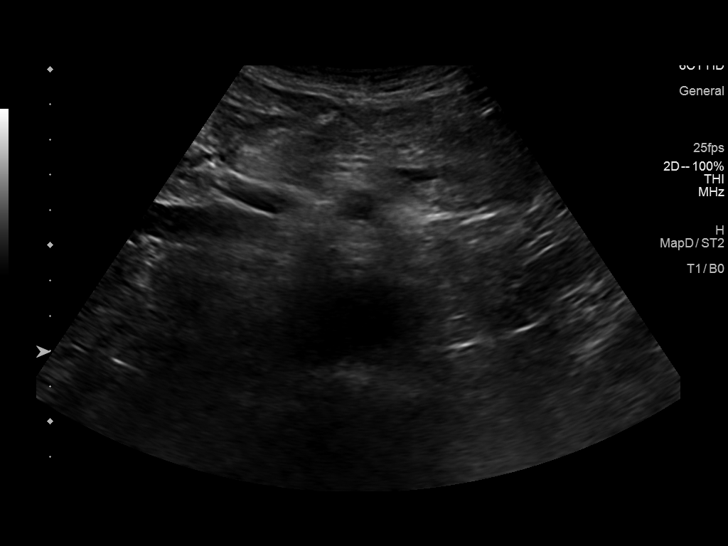
[im 11/33]
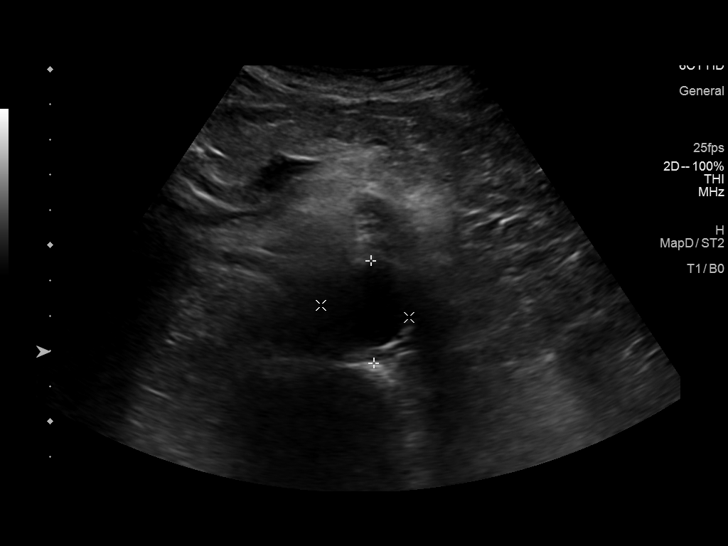
[im 13/33]
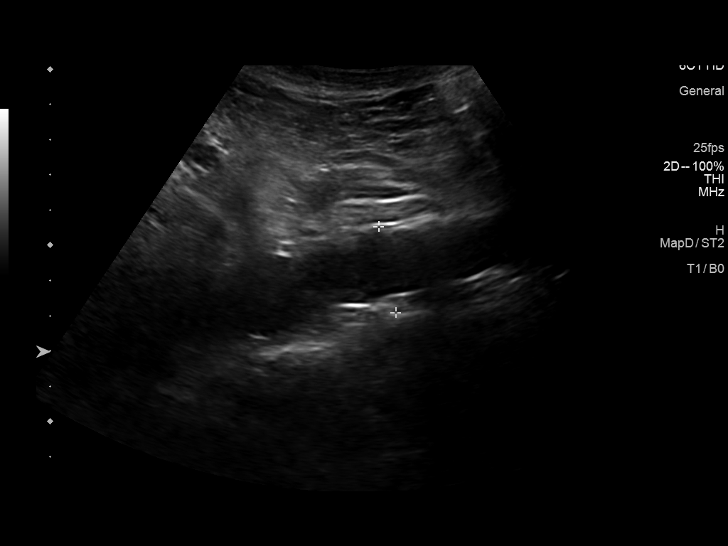
[im 15/33]
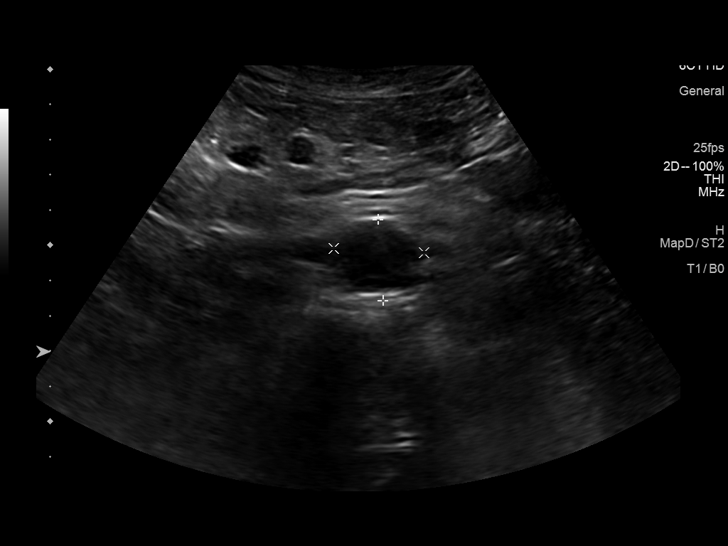
[im 18/33]
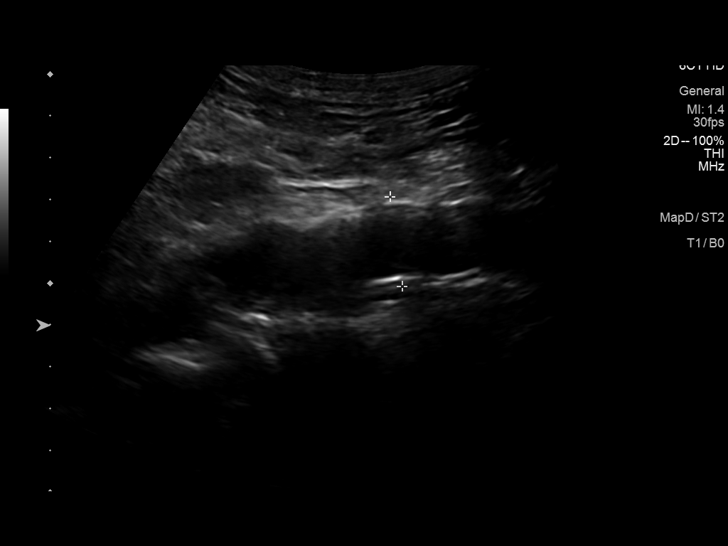
[im 21/33]
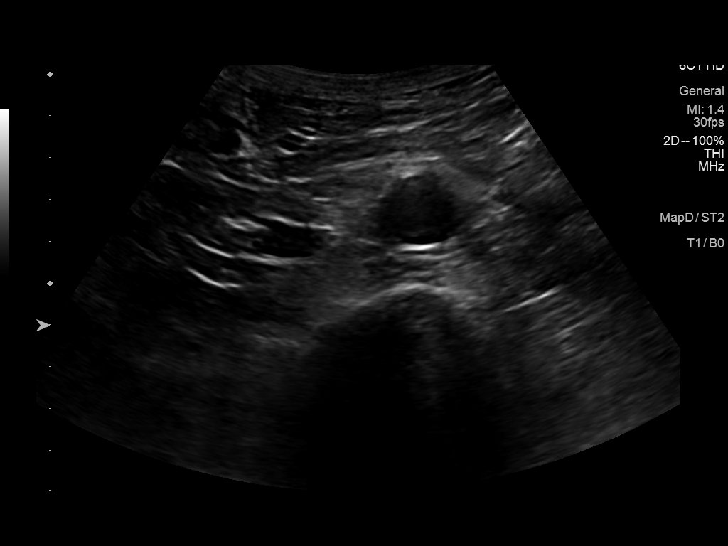
[im 22/33]
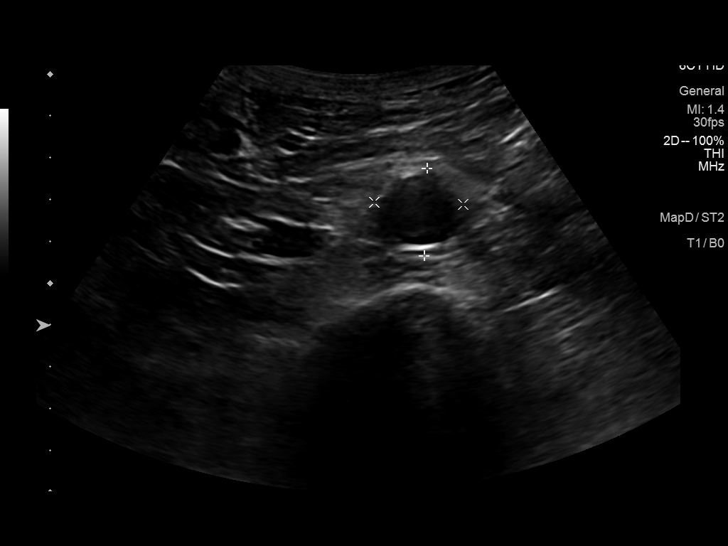
[im 25/33]
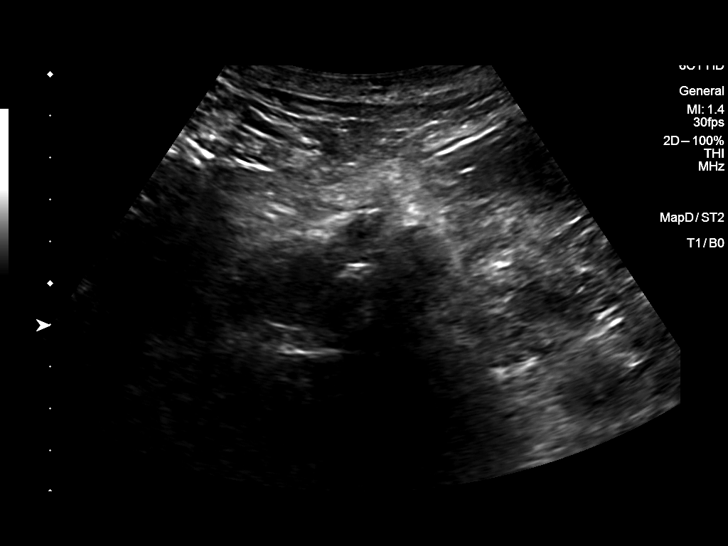
[im 27/33]
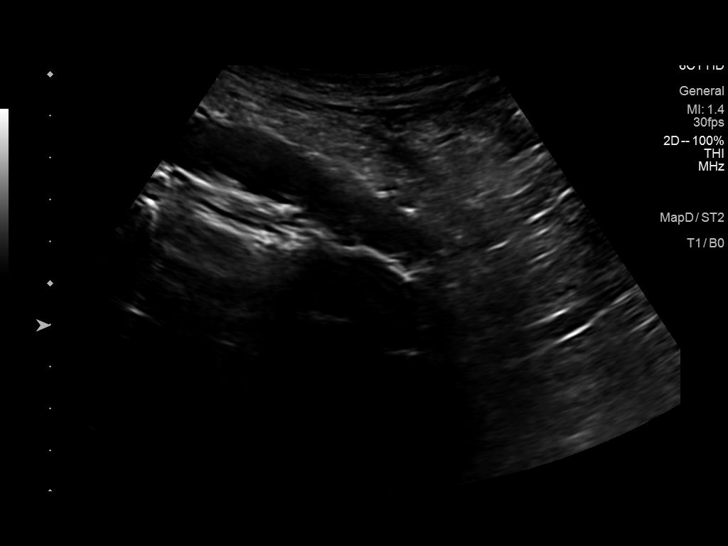
[im 30/33]
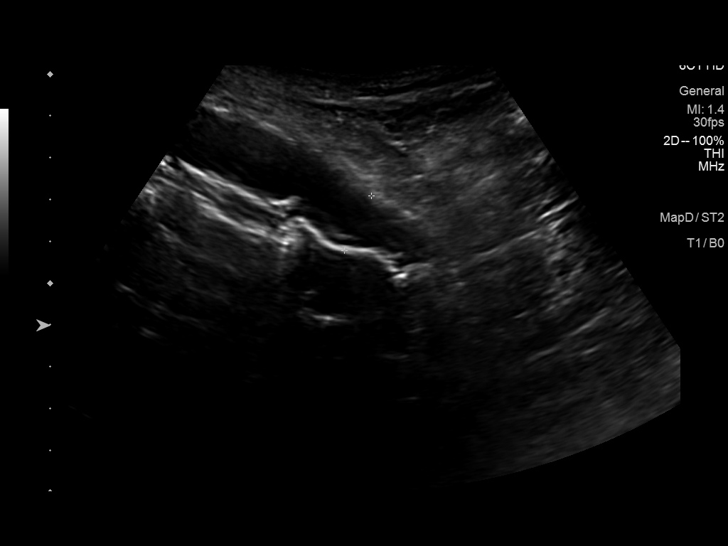
[im 33/33]
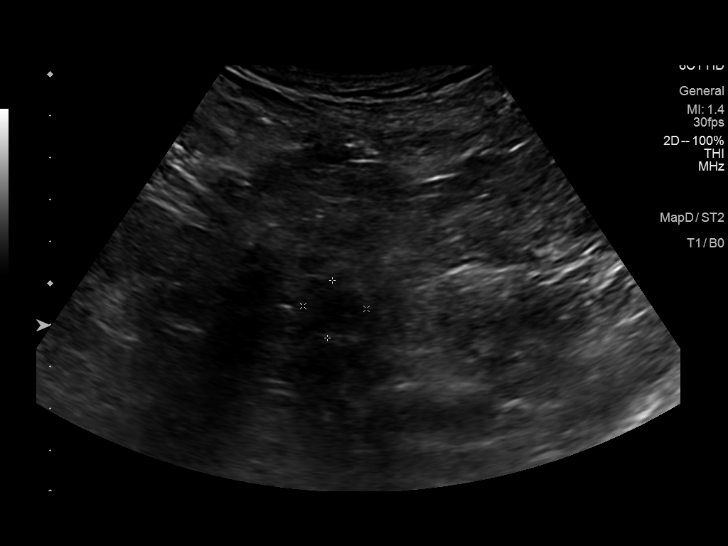

[14 of 25 positions shown; findings below may reference images not displayed]

FINDINGS: Abdominal aortic measurements as follows:

Proximal:  2.9 cm

Mid:  2.8 cm

Distal:  2.2 cm
Patent: Yes, peak systolic velocity is 68 cm/s

Right common iliac artery: 1.2 cm

Left common iliac artery: 1.5 cm

Atherosclerotic plaques and small scattered calcifications are seen.
IMPRESSION: There is mild ectasia of proximal abdominal aorta without focal
aneurysmal dilation. Overall, no significant interval changes are
noted since 01/29/2017.

## 2023-11-12 DIAGNOSIS — G4733 Obstructive sleep apnea (adult) (pediatric): Secondary | ICD-10-CM | POA: Diagnosis not present

## 2023-12-11 DIAGNOSIS — Z4889 Encounter for other specified surgical aftercare: Secondary | ICD-10-CM | POA: Diagnosis not present

## 2024-01-09 DIAGNOSIS — M5416 Radiculopathy, lumbar region: Secondary | ICD-10-CM | POA: Diagnosis not present

## 2024-01-09 DIAGNOSIS — M48062 Spinal stenosis, lumbar region with neurogenic claudication: Secondary | ICD-10-CM | POA: Diagnosis not present

## 2024-01-31 NOTE — Progress Notes (Signed)
 Cardiology Office Note:   Date:  02/06/2024  ID:  Mark Zamora, DOB 05-13-1946, MRN [id number] PCP:  Aliene Beams, MD  [facility] Providers Cardiologist:  Alverda Skeans, MD Referring MD: Aliene Beams, MD  Chief Complaint/Reason for Referral: Follow-up for aortic stenosis ASSESSMENT:    1. S/P TAVR (transcatheter aortic valve replacement)   2. Coronary artery disease involving native coronary artery of native heart without angina pectoris   3. Hyperlipidemia LDL goal <55   4. Aortic atherosclerosis (HCC)   5. Primary hypertension   6. Cardioembolic stroke [facility])     PLAN:   In order of problems listed above: Status post TAVR: Most recent echocardiogram demonstrated stable valve function.  Continue aspirin 81 mg daily. Coronary artery disease: Mild on preprocedural coronary angiography.  Continue aspirin 81 mg daily and Crestor 10 mg daily. Hyperlipidemia: Check lipid panel, LFTs, LP(a) today.  Goal LDL is less than 55 given history of cardioembolic stroke. Aortic atherosclerosis: Continue aspirin 81 mg, Crestor 10 mg, and strict blood pressure control. Hypertension: Continue amlodipine 10 mg, lisinopril 40 mg, and spironolactone 25 mg.  Blood pressure is well-controlled today. Cardioembolic stroke: Due to TAVR procedure.  Continue aspirin 81 mg, Crestor 10 mg, and strict control of cardiovascular risk factors.            Dispo:  Return in about 6 months (around 08/08/2024).      Medication Adjustments/Labs and Tests Ordered: Current medicines are reviewed at length with the patient today.  Concerns regarding medicines are outlined above.  The following changes have been made:  no change   Labs/tests ordered: Orders Placed This Encounter  Procedures   Lipoprotein A (LPA)   Lipid panel   Hepatic function panel   EKG 12-Lead    Medication Changes: No orders of the defined types were placed in this encounter.   Current medicines are reviewed at length  with the patient today.  The patient does not have concerns regarding medicines.  I spent 35 minutes reviewing all clinical data during and prior to this visit including all relevant imaging studies, laboratories, clinical information from other health systems and prior notes from both Cardiology and other specialties, interviewing the patient, conducting a complete physical examination, and coordinating care in order to formulate a comprehensive and personalized evaluation and treatment plan.   History of Present Illness:      FOCUSED PROBLEM LIST:   Severe AS Status post 29 S3 TAVR September 2023 Complicated by stroke CAD Mild, nonobstructive coronary angiography 2023 Hyperlipidemia Aortic atherosclerosis CT angio 2023 Hypertension CVA  September 2023 following discharge after TAVR Frailty DNR followed by palliative care COPD OSA  On CPAP  June 2023 consultation:  The patient is a 78 y.o. male with the indicated medical history here for recommendations regarding his severe aortic stenosis.  The patient is a very pleasant elderly male here with his daughter.  He is the primary caregiver for his wife who is suffering from dementia.  He does all of his activities of daily living but has noticed over the last 6 months he has had increasing shortness of breath.  On occasion he will get lightheaded when going from sitting to standing.  He additionally occasionally develops chest discomfort.  He has required no emergency room visits or hospitalizations.   Over the last 6 months he is noticing increasing shortness of breath with walking around his house and walking his dog.  He has had to stop and rest on occasion.  He is never really had shortness of breath at rest.  All of this has interrupted his activities of daily living especially given the fact he has to do a lot of things to take care of his wife.  He wishes he were not as short as breath and could eat more easily handled these tasks.    He quit smoking many years ago.  He sees a Education officer, community on a regular basis and in fact saw one not too long ago.  He reports very good dental health.  Plan: Refer for aortic valve intervention evaluation.   March 2024: The patient returns for routine follow-up.  He underwent transcatheter valve replacement in September.  He unfortunately presented with a TIA a few days after discharge.  He was placed on dual antiplatelet therapy.  He was seen by neurology as an outpatient and he was continued on high-dose aspirin and Plavix was discontinued.  Plan: Decrease aspirin to 81 mg.  March 2025:  Patient consents to use of AI scribe. In the interim he underwent lumbar fusion without complications.  He was seen last in September and was doing very well walking 1 to 2 miles a day without chest pain, shortness of breath, or any other cardiovascular symptoms.  His echocardiogram at that time demonstrated a preserved LV function with a mean gradient of 12 mmHg associated with the TAVR bioprosthesis without significant PVL.  He experiences occasional tightness in his chest at night, which he attributes to gas, as it does not occur during physical activity. No swelling in his legs is reported.He has COPD and uses an inhaler to manage symptoms. He experiences occasional wheezing and shortness of breath, especially when walking. Despite this, he maintains a routine of walking two to three miles a day with his dog, avoiding hills due to a previous fall.  He uses a CPAP machine at night to assist with breathing and reports no issues related to its use.  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 Recovery has been slow, and it has been six months since the surgery, with full recovery expected to take up to a year.  He is currently taking Flomax for urination problems, which cause him to wake up five to six times a night. He has an upcoming appointment  with a urologist next month to address these issues.  No recent hospitalizations or emergency room visits aside from his back surgery.        Current Medications: Current Meds  Medication Sig   acetaminophen (TYLENOL) 650 MG CR tablet Take 1,300 mg by mouth every 8 (eight) hours as needed for pain.   albuterol (VENTOLIN HFA) 108 (90 Base) MCG/ACT inhaler Inhale 1-2 puffs into the lungs every 6 (six) hours as needed for shortness of breath or wheezing.   amLODipine (NORVASC) 10 MG tablet Take 10 mg by mouth daily.   antiseptic oral rinse (BIOTENE) LIQD 15 mLs by Mouth Rinse route as needed for dry mouth.   aspirin EC 81 MG tablet Take 1 tablet (81 mg total) by mouth daily. Swallow whole.   budesonide-formoterol (SYMBICORT) 160-4.5 MCG/ACT inhaler Inhale 2 puffs into the lungs 2 (two) times daily.   Cholecalciferol (VITAMIN D3) 50 MCG (2000 UT) capsule Take 2,000 Units by mouth daily.   diclofenac (VOLTAREN) 75 MG EC tablet Take 1 tablet by mouth 2 (two) times daily as needed.   doxazosin (CARDURA) 8 MG tablet Take  8 mg by mouth at bedtime.   famotidine (PEPCID) 40 MG tablet Take 40 mg by mouth every evening.   fluticasone (FLONASE) 50 MCG/ACT nasal spray Place 2 sprays into both nostrils at bedtime.   lisinopril (ZESTRIL) 40 MG tablet Take 1 tablet (40 mg total) by mouth daily.   Melatonin 10 MG TABS Take 20 mg by mouth at bedtime.   Multiple Vitamins-Minerals (PRESERVISION AREDS 2 PO) Take 1 tablet by mouth 2 (two) times daily with a meal.   ondansetron (ZOFRAN) 4 MG tablet Take 1 tablet (4 mg total) by mouth every 8 (eight) hours as needed for nausea or vomiting.   pantoprazole (PROTONIX) 40 MG tablet TAKE ONE TABLET BY MOUTH ONE TIME DAILY   potassium chloride SA (K-DUR,KLOR-CON) 20 MEQ tablet Take 20 mEq by mouth daily.    Psyllium (METAMUCIL) 28.3 % POWD Take 1 Scoop by mouth daily.   rosuvastatin (CRESTOR) 10 MG tablet TAKE ONE TABLET BY MOUTH ONE TIME DAILY   spironolactone  (ALDACTONE) 25 MG tablet Take 0.5 tablets (12.5 mg total) by mouth daily. Take 6.25 mg by mouth daily. (Patient taking differently: Take 6.25 mg by mouth daily.)   tamsulosin (FLOMAX) 0.4 MG CAPS capsule Take 0.8 mg by mouth daily.   tiZANidine (ZANAFLEX) 4 MG tablet Take by mouth.   vitamin E 180 MG (400 UNITS) capsule Take 400 Units by mouth daily.   zolpidem (AMBIEN CR) 12.5 MG CR tablet Take 1 tablet by mouth once daily at bedtime as needed (Patient taking differently: Take 12.5 mg by mouth at bedtime.)     Review of Systems:   Please see the history of present illness.    All other systems reviewed and are negative.     EKGs/Labs/Other Test Reviewed:   EKG: EKG from March 2024 demonstrates sinus bradycardia nonspecific ST and T wave changes  EKG Interpretation Date/Time:    Ventricular Rate:    PR Interval:    QRS Duration:    QT Interval:    QTC Calculation:   R Axis:      Text Interpretation:           Risk Assessment/Calculations:          Physical Exam:   VS:  BP 120/68   Pulse (!) 55   Ht 5\' 5"  (1.651 m)   Wt 177 lb 9.6 oz (80.6 kg)   SpO2 97%   BMI 29.55 kg/m        Wt Readings from Last 3 Encounters:  02/06/24 177 lb 9.6 oz (80.6 kg)  08/08/23 172 lb (78 kg)  06/25/23 174 lb (78.9 kg)      GENERAL:  No apparent distress, AOx3 HEENT:  No carotid bruits, +2 carotid impulses, no scleral icterus CAR: RRR no murmurs, gallops, rubs, or thrills RES:  Clear to auscultation bilaterally ABD:  Soft, nontender, nondistended, positive bowel sounds x 4 VASC:  +2 radial pulses, +2 carotid pulses NEURO:  CN 2-12 grossly intact; motor and sensory grossly intact PSYCH:  No active depression or anxiety EXT:  No edema, ecchymosis, or cyanosis  Signed, Orbie Pyo, MD  02/06/2024 10:45 AM    [facility] [address] [place], [place], [place]  [address] Phone: [phone]; Fax: [phone]   Note:  This document was prepared using Dragon  voice recognition software and may include unintentional dictation errors.

## 2024-02-06 ENCOUNTER — Encounter: Payer: Self-pay | Admitting: Internal Medicine

## 2024-02-06 ENCOUNTER — Ambulatory Visit: Payer: Medicare Other | Attending: Internal Medicine | Admitting: Internal Medicine

## 2024-02-06 VITALS — BP 120/68 | HR 55 | Ht 65.0 in | Wt 177.6 lb

## 2024-02-06 DIAGNOSIS — E785 Hyperlipidemia, unspecified: Secondary | ICD-10-CM | POA: Diagnosis not present

## 2024-02-06 DIAGNOSIS — I639 Cerebral infarction, unspecified: Secondary | ICD-10-CM | POA: Diagnosis not present

## 2024-02-06 DIAGNOSIS — I700 Atherosclerosis of aorta: Secondary | ICD-10-CM

## 2024-02-06 DIAGNOSIS — Z952 Presence of prosthetic heart valve: Secondary | ICD-10-CM

## 2024-02-06 DIAGNOSIS — I10 Essential (primary) hypertension: Secondary | ICD-10-CM | POA: Diagnosis not present

## 2024-02-06 DIAGNOSIS — I2510 Atherosclerotic heart disease of native coronary artery without angina pectoris: Secondary | ICD-10-CM

## 2024-02-06 NOTE — Patient Instructions (Signed)
 Medication Instructions:  No changes *If you need a refill on your cardiac medications before your next appointment, please call your pharmacy*   Lab Work: Go to [facility] for blood work -lipids, liver function, Lpa   Testing/Procedures: None   Follow-Up: At [facility], you and your health needs are our priority.  As part of our continuing mission to provide you with exceptional heart care, we have created designated Provider Care Teams.  These Care Teams include your primary Cardiologist (physician) and Advanced Practice Providers (APPs -  Physician Assistants and Nurse Practitioners) who all work together to provide you with the care you need, when you need it.    Your next appointment:   6 month(s)  Provider:   Tereso Newcomer, PA-C  Other Instructions

## 2024-02-07 ENCOUNTER — Encounter: Payer: Self-pay | Admitting: Internal Medicine

## 2024-02-07 LAB — LIPID PANEL
Chol/HDL Ratio: 2.3 ratio (ref 0.0–5.0)
Cholesterol, Total: 106 mg/dL (ref 100–199)
HDL: 46 mg/dL (ref >39)
LDL Chol Calc (NIH): 46 mg/dL (ref 0–99)
Triglycerides: 66 mg/dL (ref 0–149)
VLDL Cholesterol Cal: 14 mg/dL (ref 5–40)

## 2024-02-07 LAB — HEPATIC FUNCTION PANEL
ALT: 14 IU/L (ref 0–44)
AST: 18 IU/L (ref 0–40)
Albumin: 4 g/dL (ref 3.8–4.8)
Alkaline Phosphatase: 102 IU/L (ref 44–121)
Bilirubin Total: 0.3 mg/dL (ref 0.0–1.2)
Bilirubin, Direct: 0.12 mg/dL (ref 0.00–0.40)
Total Protein: 6.5 g/dL (ref 6.0–8.5)

## 2024-02-07 LAB — LIPOPROTEIN A (LPA): Lipoprotein (a): 9.5 nmol/L (ref <75.0)

## 2024-04-08 DIAGNOSIS — I10 Essential (primary) hypertension: Secondary | ICD-10-CM | POA: Diagnosis not present

## 2024-04-08 DIAGNOSIS — K219 Gastro-esophageal reflux disease without esophagitis: Secondary | ICD-10-CM | POA: Diagnosis not present

## 2024-04-08 DIAGNOSIS — G4700 Insomnia, unspecified: Secondary | ICD-10-CM | POA: Diagnosis not present

## 2024-04-08 DIAGNOSIS — E876 Hypokalemia: Secondary | ICD-10-CM | POA: Diagnosis not present

## 2024-04-08 DIAGNOSIS — E782 Mixed hyperlipidemia: Secondary | ICD-10-CM | POA: Diagnosis not present

## 2024-05-09 DIAGNOSIS — H35363 Drusen (degenerative) of macula, bilateral: Secondary | ICD-10-CM | POA: Diagnosis not present

## 2024-05-09 DIAGNOSIS — H348112 Central retinal vein occlusion, right eye, stable: Secondary | ICD-10-CM | POA: Diagnosis not present

## 2024-05-09 DIAGNOSIS — H35372 Puckering of macula, left eye: Secondary | ICD-10-CM | POA: Diagnosis not present

## 2024-05-20 DIAGNOSIS — M545 Low back pain, unspecified: Secondary | ICD-10-CM | POA: Diagnosis not present

## 2024-07-11 ENCOUNTER — Telehealth: Payer: Self-pay

## 2024-07-11 NOTE — Telephone Encounter (Signed)
 Pt called today stating that he received a message to make an appointment with someone, but he was unsure who called so he called the structural heart's number. Pt has already had his 1 year s/p TAVR appt with Dr Wendel on 02/06/24, and the chart from that visit states to follow up with Glendia Ferrier in 6 months. I scheduled apt for an open OV with Eddy Ferrier on 08/13/24 at 10:30AM.

## 2024-08-05 DIAGNOSIS — E782 Mixed hyperlipidemia: Secondary | ICD-10-CM | POA: Diagnosis not present

## 2024-08-05 DIAGNOSIS — Z952 Presence of prosthetic heart valve: Secondary | ICD-10-CM | POA: Diagnosis not present

## 2024-08-05 DIAGNOSIS — I2510 Atherosclerotic heart disease of native coronary artery without angina pectoris: Secondary | ICD-10-CM | POA: Diagnosis not present

## 2024-08-05 DIAGNOSIS — I10 Essential (primary) hypertension: Secondary | ICD-10-CM | POA: Diagnosis not present

## 2024-08-05 DIAGNOSIS — Z0000 Encounter for general adult medical examination without abnormal findings: Secondary | ICD-10-CM | POA: Diagnosis not present

## 2024-08-05 DIAGNOSIS — Z8673 Personal history of transient ischemic attack (TIA), and cerebral infarction without residual deficits: Secondary | ICD-10-CM | POA: Diagnosis not present

## 2024-08-05 DIAGNOSIS — M5459 Other low back pain: Secondary | ICD-10-CM | POA: Diagnosis not present

## 2024-08-05 DIAGNOSIS — J449 Chronic obstructive pulmonary disease, unspecified: Secondary | ICD-10-CM | POA: Diagnosis not present

## 2024-08-05 DIAGNOSIS — G4700 Insomnia, unspecified: Secondary | ICD-10-CM | POA: Diagnosis not present

## 2024-08-05 DIAGNOSIS — I5032 Chronic diastolic (congestive) heart failure: Secondary | ICD-10-CM | POA: Diagnosis not present

## 2024-08-05 DIAGNOSIS — Z79899 Other long term (current) drug therapy: Secondary | ICD-10-CM | POA: Diagnosis not present

## 2024-08-11 NOTE — Progress Notes (Signed)
 "     OFFICE NOTE:    Date:  08/13/2024  ID:  Mark Zamora, DOB 1946-07-05, MRN [id number] PCP: Rolinda Millman, MD  [facility] Providers Cardiologist:  Lurena MARLA Red, MD       Aortic stenosis  S/p TAVR 07/2022 TTE 08/08/23: EF 60-65, no RWMA, mild LVH, NL RVSF, NL PASP, trivial MR, NL structure and function of TAVR, mean 12 mmHg Coronary artery disease  LHC 05/31/22: LAD mild diffuse dz; LCx irregs; RCA mild diffuse disease Hypertension  Hyperlipidemia  Hx of CVA Cardioembolic; in setting of TAVR procedure  Abdominal aortic aneurysm  US  02/22/2022: Mild ectasia of proximal abdominal aorta 2.9 cm Aortic atherosclerosis  OSA on CPAP Chronic Obstructive Pulmonary Disease DNR (Do Not Resuscitate)        Discussed the use of AI scribe software for clinical note transcription with the patient, who gave verbal consent to proceed. History of Present Illness Mark Zamora is a 78 y.o. male who returns for follow up of aortic valve disease, CAD. He was last seen by Dr. Red in 01/2024.   He is here alone.  He experiences occasional chest tightness on the left side, described as occurring 'once in a great, great while'. There is no worsening of this symptom, and he does not require nitroglycerin . No unusual shortness of breath beyond his baseline COPD symptoms, which he manages with an inhaler. No episodes of syncope, orthopnea, or peripheral edema. He uses a CPAP machine for sleep apnea and prefers to sleep with his head elevated. No bleeding issues, black stools, or hematuria. His wife recently passed away after suffering from dementia and complications from a meningioma and seizures. He has been living in the area for 24 years, originally from [place].   ROS-See HPI    Studies Reviewed:      Labs - chart review 06/14/2023: Creatinine 0.71, Hgb 10.5 02/06/2024: ALT 14, HDL 46, LDL 46, total cholesterol 893, triglycerides 66         Physical Exam:  VS:  BP 105/66    Pulse (!) 56   Ht 5' 2 (1.575 m)   Wt 176 lb 9.6 oz (80.1 kg)   SpO2 96%   BMI 32.30 kg/m        Wt Readings from Last 3 Encounters:  08/13/24 176 lb 9.6 oz (80.1 kg)  02/06/24 177 lb 9.6 oz (80.6 kg)  08/08/23 172 lb (78 kg)    Constitutional:      Appearance: Healthy appearance. Not in distress.  Neck:     Vascular: No JVR.  Pulmonary:     Breath sounds: Normal breath sounds. No wheezing. No rales.  Cardiovascular:     Normal rate. Regular rhythm.     Murmurs: There is a grade 1/6 systolic murmur at the URSB and ULSB.  Edema:    Peripheral edema absent.  Abdominal:     Palpations: Abdomen is soft.       Assessment and Plan:    Assessment & Plan Severe aortic stenosis S/p TAVR in 07/2022.  Echocardiogram in 07/2023 with normally functioning TAVR normal EF.  He is doing well overall.  He walks 2 to 3 miles on a regular basis.  He has rare episodes of left chest discomfort. -Continue SBE prophylaxis -Follow-up 6 months Coronary artery disease involving native coronary artery of native heart without angina pectoris Mild nonobstructive disease by cardiac catheterization July 2023.  As noted, he has rare anginal symptoms.  No further testing  needed at this time. -Continue aspirin  81 mg daily, rosuvastatin  10 mg daily Essential hypertension Blood pressure controlled. -Continue amlodipine  10 mg daily, doxazosin  8 mg daily, lisinopril  40 mg daily, spironolactone  Pure hypercholesterolemia LDL optimal in March 2025. - Continue rosuvastatin  10 mg daily         Dispo:  Return in about 6 months (around 02/10/2025) for Routine Follow Up, w/ Dr. Wendel, or Mark Ferrier, PA-C.  Signed, Mark Ferrier, PA-C   "

## 2024-08-12 DIAGNOSIS — I2510 Atherosclerotic heart disease of native coronary artery without angina pectoris: Secondary | ICD-10-CM | POA: Insufficient documentation

## 2024-08-12 NOTE — Assessment & Plan Note (Signed)
 S/p TAVR in 07/2022.  Echocardiogram in 07/2023 with normally functioning TAVR normal EF.***

## 2024-08-12 NOTE — Assessment & Plan Note (Signed)
 Mild nonobstructive disease by cardiac catheterization July 2023.***

## 2024-08-12 NOTE — Assessment & Plan Note (Signed)
 LDL optimal in March 2025.***

## 2024-08-13 ENCOUNTER — Encounter: Payer: Self-pay | Admitting: Physician Assistant

## 2024-08-13 ENCOUNTER — Ambulatory Visit: Attending: Cardiology | Admitting: Physician Assistant

## 2024-08-13 VITALS — BP 105/66 | HR 56 | Ht 62.0 in | Wt 176.6 lb

## 2024-08-13 DIAGNOSIS — I10 Essential (primary) hypertension: Secondary | ICD-10-CM

## 2024-08-13 DIAGNOSIS — I2510 Atherosclerotic heart disease of native coronary artery without angina pectoris: Secondary | ICD-10-CM | POA: Diagnosis not present

## 2024-08-13 DIAGNOSIS — E7800 Pure hypercholesterolemia, unspecified: Secondary | ICD-10-CM | POA: Diagnosis not present

## 2024-08-13 DIAGNOSIS — I350 Nonrheumatic aortic (valve) stenosis: Secondary | ICD-10-CM

## 2024-08-13 NOTE — Assessment & Plan Note (Signed)
 Blood pressure controlled. -Continue amlodipine  10 mg daily, doxazosin  8 mg daily, lisinopril  40 mg daily, spironolactone 

## 2024-08-13 NOTE — Patient Instructions (Signed)
 Medication Instructions:  Your physician recommends that you continue on your current medications as directed. Please refer to the Current Medication list given to you today.  *If you need a refill on your cardiac medications before your next appointment, please call your pharmacy*  Lab Work: None ordered  If you have labs (blood work) drawn today and your tests are completely normal, you will receive your results only by: MyChart Message (if you have MyChart) OR A paper copy in the mail If you have any lab test that is abnormal or we need to change your treatment, we will call you to review the results.  Testing/Procedures: None ordered  Follow-Up: At [facility], you and your health needs are our priority.  As part of our continuing mission to provide you with exceptional heart care, our providers are all part of one team.  This team includes your primary Cardiologist (physician) and Advanced Practice Providers or APPs (Physician Assistants and Nurse Practitioners) who all work together to provide you with the care you need, when you need it.  Your next appointment:   6 month(s)  Provider:   Arun K Thukkani, MD or Glendia Ferrier, PA-C          We recommend signing up for the patient portal called MyChart.  Sign up information is provided on this After Visit Summary.  MyChart is used to connect with patients for Virtual Visits (Telemedicine).  Patients are able to view lab/test results, encounter notes, upcoming appointments, etc.  Non-urgent messages can be sent to your provider as well.   To learn more about what you can do with MyChart, go to [email or web address].   Other Instructions

## 2025-03-11 ENCOUNTER — Ambulatory Visit: Admitting: Physician Assistant
